# Patient Record
Sex: Female | Born: 1955 | Race: White | Hispanic: No | Marital: Married | Smoking: Current every day smoker
Health system: Southern US, Community
[De-identification: ages and names within clinical notes are randomized; demographics above are authoritative.]

## PROBLEM LIST (undated history)

## (undated) DIAGNOSIS — F17200 Nicotine dependence, unspecified, uncomplicated: Secondary | ICD-10-CM

## (undated) DIAGNOSIS — J449 Chronic obstructive pulmonary disease, unspecified: Secondary | ICD-10-CM

## (undated) DIAGNOSIS — N814 Uterovaginal prolapse, unspecified: Secondary | ICD-10-CM

## (undated) DIAGNOSIS — M069 Rheumatoid arthritis, unspecified: Secondary | ICD-10-CM

## (undated) DIAGNOSIS — C801 Malignant (primary) neoplasm, unspecified: Secondary | ICD-10-CM

## (undated) DIAGNOSIS — I10 Essential (primary) hypertension: Secondary | ICD-10-CM

## (undated) DIAGNOSIS — K219 Gastro-esophageal reflux disease without esophagitis: Secondary | ICD-10-CM

## (undated) DIAGNOSIS — J45909 Unspecified asthma, uncomplicated: Secondary | ICD-10-CM

## (undated) HISTORY — DX: Uterovaginal prolapse, unspecified: N81.4

## (undated) HISTORY — PX: SQUAMOUS CELL CARCINOMA EXCISION: SHX2433

## (undated) HISTORY — DX: Unspecified asthma, uncomplicated: J45.909

## (undated) HISTORY — PX: MOHS SURGERY: SUR867

## (undated) HISTORY — DX: Rheumatoid arthritis, unspecified: M06.9

---

## 1998-03-05 ENCOUNTER — Other Ambulatory Visit: Admission: RE | Admit: 1998-03-05 | Discharge: 1998-03-05 | Payer: Self-pay | Admitting: *Deleted

## 1999-04-03 ENCOUNTER — Other Ambulatory Visit: Admission: RE | Admit: 1999-04-03 | Discharge: 1999-04-03 | Payer: Self-pay | Admitting: *Deleted

## 2000-09-04 ENCOUNTER — Other Ambulatory Visit: Admission: RE | Admit: 2000-09-04 | Discharge: 2000-09-04 | Payer: Self-pay | Admitting: Obstetrics and Gynecology

## 2001-08-15 ENCOUNTER — Emergency Department (HOSPITAL_COMMUNITY): Admission: EM | Admit: 2001-08-15 | Discharge: 2001-08-15 | Payer: Self-pay | Admitting: Internal Medicine

## 2001-10-27 ENCOUNTER — Other Ambulatory Visit: Admission: RE | Admit: 2001-10-27 | Discharge: 2001-10-27 | Payer: Self-pay | Admitting: Obstetrics and Gynecology

## 2001-11-22 ENCOUNTER — Encounter: Payer: Self-pay | Admitting: Family Medicine

## 2001-11-22 ENCOUNTER — Ambulatory Visit (HOSPITAL_COMMUNITY): Admission: RE | Admit: 2001-11-22 | Discharge: 2001-11-22 | Payer: Self-pay | Admitting: Family Medicine

## 2002-06-28 ENCOUNTER — Encounter: Payer: Self-pay | Admitting: Family Medicine

## 2002-06-28 ENCOUNTER — Ambulatory Visit (HOSPITAL_COMMUNITY): Admission: RE | Admit: 2002-06-28 | Discharge: 2002-06-28 | Payer: Self-pay | Admitting: Family Medicine

## 2002-10-31 ENCOUNTER — Other Ambulatory Visit: Admission: RE | Admit: 2002-10-31 | Discharge: 2002-10-31 | Payer: Self-pay | Admitting: Obstetrics and Gynecology

## 2003-11-02 ENCOUNTER — Other Ambulatory Visit: Admission: RE | Admit: 2003-11-02 | Discharge: 2003-11-02 | Payer: Self-pay | Admitting: Obstetrics and Gynecology

## 2009-02-07 ENCOUNTER — Ambulatory Visit (HOSPITAL_COMMUNITY): Admission: RE | Admit: 2009-02-07 | Discharge: 2009-02-07 | Payer: Self-pay | Admitting: Interventional Radiology

## 2013-03-23 ENCOUNTER — Other Ambulatory Visit: Payer: Self-pay | Admitting: Family Medicine

## 2013-06-18 ENCOUNTER — Encounter: Payer: Self-pay | Admitting: *Deleted

## 2014-01-03 ENCOUNTER — Ambulatory Visit (INDEPENDENT_AMBULATORY_CARE_PROVIDER_SITE_OTHER): Payer: BC Managed Care – PPO | Admitting: Family Medicine

## 2014-01-03 ENCOUNTER — Encounter: Payer: Self-pay | Admitting: Family Medicine

## 2014-01-03 VITALS — BP 128/82 | Temp 98.3°F | Ht 68.0 in | Wt 147.0 lb

## 2014-01-03 DIAGNOSIS — H8110 Benign paroxysmal vertigo, unspecified ear: Secondary | ICD-10-CM

## 2014-01-03 MED ORDER — MECLIZINE HCL 25 MG PO TABS: 25.0000 mg | ORAL_TABLET | Freq: Three times a day (TID) | ORAL | Status: AC | PRN

## 2014-01-03 NOTE — Progress Notes (Signed)
   Subjective:    Patient ID: Dawn Meyer, female    DOB: August 29, 1955, 58 y.o.   MRN: [id number]  HPI Comments: Comes and goes. If she turns her head quickly, she feels dizzy. When she gets out of the bed, she is dizzy. Pt has felt dizzy in the shower when washing her hair.   Dizziness This is a new problem. Episode onset: Friday. The problem occurs daily. Associated symptoms include vertigo. Nothing aggravates the symptoms. She has tried nothing for the symptoms.   Spinning sens with nausea and dizziness  The first day hit harde, now somewhat better  No nausea no vomiting  No headach  Some possible tightness in face  No sore throat/ a friend gave pt  MECLizine tab otc//   Review of Systems  Neurological: Positive for dizziness and vertigo.   No chest pain no headache no back pain ROS otherwise negative    Objective:   Physical Exam  Alert no acute distress. HEENT normal. Lungs clear. Heart regular rate rhythm. Neuro exam intact      Assessment & Plan:  Impression 1 vertigo likely secondary to in her ear dysfunction discussed plan Antivert when necessary. Symptomatic care discussed. Warning signs discussed. WSL

## 2014-03-23 ENCOUNTER — Other Ambulatory Visit: Payer: Self-pay | Admitting: Family Medicine

## 2014-07-11 ENCOUNTER — Telehealth: Payer: Self-pay | Admitting: Family Medicine

## 2014-07-11 MED ORDER — FLUCONAZOLE 150 MG PO TABS: 150.0000 mg | ORAL_TABLET | Freq: Once | ORAL | Status: DC

## 2014-07-11 NOTE — Telephone Encounter (Signed)
Pt is requesting diflucan be called in for a yeast infection. [place].

## 2014-07-11 NOTE — Telephone Encounter (Signed)
Sent in Diflucan per protocol. Pt will schedule OV if she feels like she is getting worst.

## 2014-07-17 ENCOUNTER — Ambulatory Visit (INDEPENDENT_AMBULATORY_CARE_PROVIDER_SITE_OTHER): Payer: BC Managed Care – PPO | Admitting: Family Medicine

## 2014-07-17 ENCOUNTER — Encounter: Payer: Self-pay | Admitting: Family Medicine

## 2014-07-17 VITALS — BP 140/90 | Temp 98.3°F | Ht 67.0 in | Wt 146.0 lb

## 2014-07-17 DIAGNOSIS — J310 Chronic rhinitis: Secondary | ICD-10-CM

## 2014-07-17 DIAGNOSIS — J329 Chronic sinusitis, unspecified: Secondary | ICD-10-CM

## 2014-07-17 MED ORDER — FLUCONAZOLE 150 MG PO TABS: ORAL_TABLET | ORAL | Status: DC

## 2014-07-17 MED ORDER — AMOXICILLIN-POT CLAVULANATE 875-125 MG PO TABS: 1.0000 | ORAL_TABLET | Freq: Two times a day (BID) | ORAL | Status: AC

## 2014-07-17 NOTE — Progress Notes (Signed)
   Subjective:    Patient ID: Dawn Meyer, female    DOB: 1955/09/15, 59 y.o.   MRN: [id number]  Sinusitis This is a new problem. Episode onset: Thursday. There has been no fever. Associated symptoms include congestion, coughing and sinus pressure. (Wheezing at night) Past treatments include oral decongestants (inhaler). The treatment provided mild relief.   Vertigo like syptoms  Then cough and coug  Frontal tightness and fullness  Using the inhaler thru the weekend  Wheezy cough prod with less whezing   History of asthma Review of Systems  HENT: Positive for congestion and sinus pressure.   Respiratory: Positive for cough.    no vomiting no diarrhea     Objective:   Physical Exam  Alert mild malaise vital stable HET moderate nasal congestion frontal turns pharynx erythematous neck supple. Lungs bronchial cough but no wheezes or crackles no tachypnea heart regular in rhythm.      Assessment & Plan:  Impression rhinosinusitis/flare of asthma plan Ventolin 2 sprays 4 times a day. Augmentin twice a day 10 days. Diflucan if needed WSL

## 2015-04-30 ENCOUNTER — Ambulatory Visit (INDEPENDENT_AMBULATORY_CARE_PROVIDER_SITE_OTHER): Payer: BC Managed Care – PPO | Admitting: Family Medicine

## 2015-04-30 ENCOUNTER — Encounter: Payer: Self-pay | Admitting: Family Medicine

## 2015-04-30 VITALS — BP 130/82 | Temp 98.4°F | Ht 67.0 in | Wt 146.1 lb

## 2015-04-30 DIAGNOSIS — J329 Chronic sinusitis, unspecified: Secondary | ICD-10-CM | POA: Diagnosis not present

## 2015-04-30 MED ORDER — AMOXICILLIN-POT CLAVULANATE 875-125 MG PO TABS: 1.0000 | ORAL_TABLET | Freq: Two times a day (BID) | ORAL | Status: AC

## 2015-04-30 NOTE — Progress Notes (Signed)
   Subjective:    Patient ID: Dawn Meyer, female    DOB: Aug 26, 1955, 59 y.o.   MRN: [id number]  Cough This is a new problem. The current episode started in the past 7 days. Associated symptoms include myalgias, nasal congestion and rhinorrhea. Associated symptoms comments: Sinus pressure, Head congestion. Treatments tried: Alka seltzer,    Patient states no other concerns this visit. Slight weezing   Frontal cong and headache Pressure in the ceeks  Uses alka seltze   no sob   Review of Systems  HENT: Positive for rhinorrhea.   Respiratory: Positive for cough.   Musculoskeletal: Positive for myalgias.  no hi fever     Objective:   Physical Exam Alert mild malaise. Bronchial cough during exam vital stable frontal maxillary tenderness evident pharynx erythematous neck supple lungs no wheezes no crackles no tachypnea heart regular in rhythm.       Assessment & Plan:  Impression rhinosinusitis with known asthma and tendency to move into asthma flare nonobvious at this point plan appropriate antibiotic symptom care discussed if significant wheezes [place] utilize albuterol and call for prednisone WSL

## 2015-09-21 ENCOUNTER — Other Ambulatory Visit: Payer: Self-pay | Admitting: Family Medicine

## 2016-04-22 ENCOUNTER — Ambulatory Visit: Payer: BC Managed Care – PPO | Admitting: Rheumatology

## 2016-05-05 ENCOUNTER — Encounter: Payer: Self-pay | Admitting: Family Medicine

## 2016-05-05 ENCOUNTER — Ambulatory Visit (INDEPENDENT_AMBULATORY_CARE_PROVIDER_SITE_OTHER): Payer: BC Managed Care – PPO | Admitting: Family Medicine

## 2016-05-05 VITALS — BP 122/82 | Temp 98.1°F | Ht 67.0 in | Wt 144.6 lb

## 2016-05-05 DIAGNOSIS — J329 Chronic sinusitis, unspecified: Secondary | ICD-10-CM | POA: Diagnosis not present

## 2016-05-05 MED ORDER — DOXYCYCLINE HYCLATE 100 MG PO TABS: 100.0000 mg | ORAL_TABLET | Freq: Two times a day (BID) | ORAL | 0 refills | Status: DC

## 2016-05-05 MED ORDER — ALBUTEROL SULFATE HFA 108 (90 BASE) MCG/ACT IN AERS: 2.0000 | INHALATION_SPRAY | Freq: Four times a day (QID) | RESPIRATORY_TRACT | 0 refills | Status: DC | PRN

## 2016-05-05 MED ORDER — FLUCONAZOLE 150 MG PO TABS: ORAL_TABLET | ORAL | 0 refills | Status: DC

## 2016-05-05 MED ORDER — HYDROCODONE-HOMATROPINE 5-1.5 MG/5ML PO SYRP: 5.0000 mL | ORAL_SOLUTION | Freq: Every evening | ORAL | 0 refills | Status: DC | PRN

## 2016-05-05 NOTE — Progress Notes (Signed)
   Subjective:    Patient ID: Dawn Meyer, female    DOB: 1955/08/03, 60 y.o.   MRN: [id number]  Sinus Problem  This is a new problem. The current episode started in the past 7 days. Associated symptoms include congestion and coughing. Past treatments include oral decongestants (hycodan).    Not into the chest yet  Pos frontal headache, pos nasal disch, sore itn the nose, going on for awhile  Pos cough   Pos gunky dischrege  Used hydromet for the cough and has helped  Uses hydromet    Review of Systems  HENT: Positive for congestion.   Respiratory: Positive for cough.        Objective:   Physical Exam  Alert, mild malaise. Hydration good Vitals stable. frontal/ maxillary tenderness evident positive nasal congestion. pharynx normal neck supple  lungs clear/no crackles or wheezes. heart regular in rhythm       Assessment & Plan:  Impression rhinosinusitis likely post viral, discussed with patient. plan antibiotics prescribed. Questions answered. Symptomatic care discussed. warning signs discussed. WSL

## 2016-05-06 ENCOUNTER — Telehealth: Payer: Self-pay | Admitting: Rheumatology

## 2016-05-06 NOTE — Telephone Encounter (Signed)
Patient states she is on doxycycline for a sinus infection and would like to know if she should hold off on the Humira this week. Please call patient.

## 2016-05-06 NOTE — Telephone Encounter (Signed)
Patient has been advised to hold Humira until she finishes her antibiotic and she is well. Patient verbalized understanding.

## 2016-05-12 ENCOUNTER — Telehealth: Payer: Self-pay | Admitting: Family Medicine

## 2016-05-12 MED ORDER — AMOXICILLIN-POT CLAVULANATE 875-125 MG PO TABS: 1.0000 | ORAL_TABLET | Freq: Two times a day (BID) | ORAL | 0 refills | Status: AC

## 2016-05-12 MED ORDER — BENZONATATE 100 MG PO CAPS: 100.0000 mg | ORAL_CAPSULE | Freq: Four times a day (QID) | ORAL | 0 refills | Status: DC | PRN

## 2016-05-12 NOTE — Telephone Encounter (Signed)
Patient seen on 05/05/16 for rhinosinusitis.  She was prescribed doxycycline and hycodan syrup.  She broke out in fine bumps on arms, legs and back.  She stopped taking her antibiotic on Saturday morning.    She says yesterday afternoon, she developed more of a rash on her back and she believes its coming from the cough syrup because she read the side effects.  She wants to know what Dr. Richardson Landry recommends?  [place]

## 2016-05-12 NOTE — Telephone Encounter (Signed)
Chance of being allergic to both unlikely  Stay off doxy, switch to aug 875 bid ten d  Hold off on rx cough med for now but not necressarily forever   Can call in tess perles if pt desires 100 mg 28 one q six hr s pern

## 2016-05-12 NOTE — Telephone Encounter (Signed)
Prescriptions sent electronically to pharmacy. Patient notified and will stop doxycycline and hold hycodan for now.

## 2016-05-14 ENCOUNTER — Telehealth: Payer: Self-pay | Admitting: Family Medicine

## 2016-05-14 MED ORDER — PREDNISONE 20 MG PO TABS: ORAL_TABLET | ORAL | 0 refills | Status: DC

## 2016-05-14 NOTE — Telephone Encounter (Signed)
Pt is having a hard time dealing with the rash that came from the antibiotic. Pt states that she is unable to take benadryl during the day due to it making her drowsy. Pt is wanting to know if there is anything else she can do for it. Please advise.

## 2016-05-14 NOTE — Telephone Encounter (Addendum)
Prescription sent electronically to pharmacy. Patient notified. 

## 2016-06-12 ENCOUNTER — Ambulatory Visit: Payer: BC Managed Care – PPO | Admitting: Rheumatology

## 2016-06-16 ENCOUNTER — Other Ambulatory Visit: Payer: Self-pay | Admitting: Radiology

## 2016-06-16 ENCOUNTER — Telehealth: Payer: Self-pay | Admitting: Radiology

## 2016-06-16 ENCOUNTER — Other Ambulatory Visit: Payer: Self-pay | Admitting: Rheumatology

## 2016-06-16 DIAGNOSIS — Z79899 Other long term (current) drug therapy: Secondary | ICD-10-CM

## 2016-06-16 NOTE — Telephone Encounter (Signed)
Call patient / she is very past due for labs for Humira Last done in July

## 2016-06-16 NOTE — Telephone Encounter (Signed)
I have called patient to advise labs are very past due.

## 2016-06-24 ENCOUNTER — Telehealth: Payer: Self-pay | Admitting: Radiology

## 2016-06-24 LAB — CMP14+EGFR
ALT: 11 IU/L (ref 0–32)
AST: 16 IU/L (ref 0–40)
Albumin/Globulin Ratio: 1.6 (ref 1.2–2.2)
Albumin: 4.1 g/dL (ref 3.5–5.5)
Alkaline Phosphatase: 106 IU/L (ref 39–117)
BILIRUBIN TOTAL: 0.3 mg/dL (ref 0.0–1.2)
BUN / CREAT RATIO: 15 (ref 9–23)
BUN: 13 mg/dL (ref 6–24)
CALCIUM: 9 mg/dL (ref 8.7–10.2)
CO2: 24 mmol/L (ref 18–29)
CREATININE: 0.86 mg/dL (ref 0.57–1.00)
Chloride: 102 mmol/L (ref 96–106)
GFR, EST AFRICAN AMERICAN: 86 mL/min/{1.73_m2} (ref >59)
GFR, EST NON AFRICAN AMERICAN: 75 mL/min/{1.73_m2} (ref >59)
GLUCOSE: 89 mg/dL (ref 65–99)
Globulin, Total: 2.6 g/dL (ref 1.5–4.5)
Potassium: 3.6 mmol/L (ref 3.5–5.2)
Sodium: 142 mmol/L (ref 134–144)
TOTAL PROTEIN: 6.7 g/dL (ref 6.0–8.5)

## 2016-06-24 LAB — CBC WITH DIFFERENTIAL/PLATELET
BASOS ABS: 0 10*3/uL (ref 0.0–0.2)
Basos: 0 %
EOS (ABSOLUTE): 0.3 10*3/uL (ref 0.0–0.4)
Eos: 4 %
HEMOGLOBIN: 12.9 g/dL (ref 11.1–15.9)
Hematocrit: 39.8 % (ref 34.0–46.6)
IMMATURE GRANS (ABS): 0 10*3/uL (ref 0.0–0.1)
IMMATURE GRANULOCYTES: 0 %
LYMPHS: 25 %
Lymphocytes Absolute: 2.2 10*3/uL (ref 0.7–3.1)
MCH: 29.7 pg (ref 26.6–33.0)
MCHC: 32.4 g/dL (ref 31.5–35.7)
MCV: 92 fL (ref 79–97)
MONOCYTES: 8 %
Monocytes Absolute: 0.7 10*3/uL (ref 0.1–0.9)
NEUTROS PCT: 63 %
Neutrophils Absolute: 5.5 10*3/uL (ref 1.4–7.0)
PLATELETS: 183 10*3/uL (ref 150–379)
RBC: 4.34 x10E6/uL (ref 3.77–5.28)
RDW: 14.2 % (ref 12.3–15.4)
WBC: 8.8 10*3/uL (ref 3.4–10.8)

## 2016-06-24 NOTE — Telephone Encounter (Signed)
-----   Message from Bo Merino, MD sent at 06/24/2016  1:16 PM EST ----- Labs normal

## 2016-06-24 NOTE — Progress Notes (Signed)
Labs normal.

## 2016-06-24 NOTE — Telephone Encounter (Signed)
Called patient to advise labs are normal

## 2016-06-30 ENCOUNTER — Ambulatory Visit: Payer: BC Managed Care – PPO | Admitting: Rheumatology

## 2016-07-23 ENCOUNTER — Other Ambulatory Visit: Payer: Self-pay | Admitting: Rheumatology

## 2016-07-24 NOTE — Telephone Encounter (Signed)
ok 

## 2016-07-24 NOTE — Telephone Encounter (Signed)
Last Visit: 11/20/15 Next Visit: 08/18/16 Labs: 06/23/16 WNL TB Gold: 03/2015 Neg Patient going beginning of next week to update TB Gold.   Okay to refill 30 supply Humira?

## 2016-07-29 ENCOUNTER — Other Ambulatory Visit: Payer: Self-pay | Admitting: *Deleted

## 2016-07-29 ENCOUNTER — Telehealth: Payer: Self-pay | Admitting: Rheumatology

## 2016-07-29 DIAGNOSIS — Z9225 Personal history of immunosupression therapy: Secondary | ICD-10-CM

## 2016-07-29 DIAGNOSIS — Z79899 Other long term (current) drug therapy: Secondary | ICD-10-CM

## 2016-07-29 NOTE — Telephone Encounter (Signed)
Patient is at [facility] right now and they are telling her that they do not have her order.  Cb#[id number].  Thank you.

## 2016-07-29 NOTE — Telephone Encounter (Signed)
[facility] needs orders released for patient. She is in the office right now for labs.   Fax# [phone]

## 2016-07-29 NOTE — Telephone Encounter (Signed)
done

## 2016-07-29 NOTE — Telephone Encounter (Signed)
Lab orders released and faxed.  

## 2016-08-01 LAB — QUANTIFERON IN TUBE
QFT TB AG MINUS NIL VALUE: 0.03 [IU]/mL
QUANTIFERON MITOGEN VALUE: 6.87 [IU]/mL
QUANTIFERON NIL VALUE: 0.08 [IU]/mL
QUANTIFERON TB AG VALUE: 0.11 IU/mL
QUANTIFERON TB GOLD: NEGATIVE

## 2016-08-01 LAB — QUANTIFERON TB GOLD ASSAY (BLOOD)

## 2016-08-01 NOTE — Progress Notes (Signed)
neg

## 2016-08-18 ENCOUNTER — Encounter: Payer: Self-pay | Admitting: Rheumatology

## 2016-08-18 ENCOUNTER — Ambulatory Visit (INDEPENDENT_AMBULATORY_CARE_PROVIDER_SITE_OTHER): Payer: BC Managed Care – PPO | Admitting: Rheumatology

## 2016-08-18 VITALS — BP 157/91 | HR 62 | Resp 13 | Ht 67.0 in | Wt 144.0 lb

## 2016-08-18 DIAGNOSIS — M159 Polyosteoarthritis, unspecified: Secondary | ICD-10-CM | POA: Insufficient documentation

## 2016-08-18 DIAGNOSIS — M810 Age-related osteoporosis without current pathological fracture: Secondary | ICD-10-CM | POA: Insufficient documentation

## 2016-08-18 DIAGNOSIS — M158 Other polyosteoarthritis: Secondary | ICD-10-CM

## 2016-08-18 DIAGNOSIS — M0579 Rheumatoid arthritis with rheumatoid factor of multiple sites without organ or systems involvement: Secondary | ICD-10-CM | POA: Diagnosis not present

## 2016-08-18 DIAGNOSIS — M19071 Primary osteoarthritis, right ankle and foot: Secondary | ICD-10-CM

## 2016-08-18 DIAGNOSIS — M19072 Primary osteoarthritis, left ankle and foot: Secondary | ICD-10-CM

## 2016-08-18 DIAGNOSIS — M25432 Effusion, left wrist: Secondary | ICD-10-CM

## 2016-08-18 DIAGNOSIS — Z79899 Other long term (current) drug therapy: Secondary | ICD-10-CM

## 2016-08-18 DIAGNOSIS — M19041 Primary osteoarthritis, right hand: Secondary | ICD-10-CM | POA: Diagnosis not present

## 2016-08-18 DIAGNOSIS — M19042 Primary osteoarthritis, left hand: Secondary | ICD-10-CM

## 2016-08-18 DIAGNOSIS — M818 Other osteoporosis without current pathological fracture: Secondary | ICD-10-CM

## 2016-08-18 MED ORDER — ADALIMUMAB 40 MG/0.8ML ~~LOC~~ PSKT: 40.0000 mg | PREFILLED_SYRINGE | SUBCUTANEOUS | 0 refills | Status: DC

## 2016-08-18 MED ORDER — ADALIMUMAB 40 MG/0.8ML ~~LOC~~ AJKT: 40.0000 mg | AUTO-INJECTOR | SUBCUTANEOUS | 0 refills | Status: DC

## 2016-08-18 NOTE — Progress Notes (Signed)
Office Visit Note  Patient: Dawn Meyer             Date of Birth: Jun 17, 1955           MRN: [id number]             PCP: Mickie Hillier, MD Referring: Mikey Kirschner, MD Visit Date: 08/18/2016 Occupation: _0 @    Subjective:  Follow-up   History of Present Illness: Dawn Meyer is a 61 y.o. female  Last seen 11/20/2015. Patient's rheumatoid arthritis is doing really well. History of rheumatoid factor positive CCP positive. Patient is taking her Humira every 2 weeks as scheduled.  Patient lost her mother 06/24/2016. She struggling with the loss as well as having to take care of her elderly father who is "independent but needs his daughter to help him on a regular basis.  She is doing cross fit and feels really good about that.  Patient is a smoker. She quit 3 years ago and then restarted. She's currently smoking about 10-15 cigarettes daily. We did discuss smoking cessation at length and patient is agreeable.   Activities of Daily Living:  Patient reports morning stiffness for 15 minutes.   Patient Denies nocturnal pain.  Difficulty dressing/grooming: Denies Difficulty climbing stairs: Denies Difficulty getting out of chair: Denies Difficulty using hands for taps, buttons, cutlery, and/or writing: Denies   No Rheumatology ROS completed.   PMFS History:  Patient Active Problem List   Diagnosis Date Noted  . Rheumatoid arthritis involving multiple sites with positive rheumatoid factor ([place]) 08/18/2016  . Swollen wrist, left 08/18/2016  . 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 Primary osteoarthritis of both feet 08/18/2016  . OP (osteoporosis) 08/18/2016  . Degenerative joint disease involving multiple joints 08/18/2016  . High risk medication use 08/18/2016    Past Medical History:  Diagnosis Date  . Asthma   . RA (rheumatoid arthritis) ([place])     History reviewed. No pertinent family history. History reviewed. No pertinent  surgical history. Social History   Social History Narrative  . No narrative on file     Objective: Vital Signs: BP (!) 157/91   Pulse 62   Resp 13   Ht _1  (1.702 m)   Wt 144 lb (65.3 kg)   BMI 22.55 kg/m    Physical Exam   Musculoskeletal Exam:  Full range of motion of all joints Grip strength is equal and strong bilaterally Fiber myalgia tender points are all absent  CDAI Exam: No CDAI exam completed.  No synovitis on examination. Note the patient has right second MCP with synovial thickening from old damage. No current tenderness warmth. Grip strength equal and strong bilaterally Fiber myalgia tender points are absent. Patient has about 0 pain from rheumatoid arthritis at this time. She does have ongoing OA of the hands and feet which causes her some discomfort at times  Investigation: Findings:  March 2017 CBC and Comprehensive metabolic panel were normal  TB Gold Negative  Orders Only on 07/29/2016  Component Date Value Ref Range Status  . QUANTIFERON INCUBATION 07/29/2016 Comment   Final  . QUANTIFERON TB GOLD 07/29/2016 Negative  Negative Final  . QUANTIFERON CRITERIA 07/29/2016 Comment   Final   Comment: To be considered positive a specimen should have a TB Ag minus Nil value greater than or equal to 0.35 IU/mL and in addition the TB Ag minus Nil value must be greater than or equal to 25% of the Nil value. There may  be insufficient information in these values to differentiate between some negative and some indeterminate test values.   . QUANTIFERON TB AG VALUE 07/29/2016 0.11  IU/mL Final  . Quantiferon Nil Value 07/29/2016 0.08  IU/mL Final  . QUANTIFERON MITOGEN VALUE 07/29/2016 6.87  IU/mL Final  . QFT TB AG MINUS NIL VALUE 07/29/2016 0.03  IU/mL Final  . Interpretation: 07/29/2016 Comment   Final   Comment: The QuantiFERON TB Gold (in Tube) assay is intended for use as an aid in the diagnosis of TB infection. Negative results suggest that  there is no TB infection. In patients with high suspicion of exposure, a negative test should be repeated. A positive test indicates infection with Mycobacterium tuberculosis. Among individuals without tuberculosis infection, a positive test may be due to exposure to Vil[place], M. szulgai or M. marinum. On the Internet, go to https://figueroa-lambert.info/ for further details.   Orders Only on 06/16/2016  Component Date Value Ref Range Status  . WBC 06/23/2016 8.8  3.4 - 10.8 x10E3/uL Final  . RBC 06/23/2016 4.34  3.77 - 5.28 x10E6/uL Final  . Hemoglobin 06/23/2016 12.9  11.1 - 15.9 g/dL Final  . Hematocrit 06/23/2016 39.8  34.0 - 46.6 % Final  . MCV 06/23/2016 92  79 - 97 fL Final  . MCH 06/23/2016 29.7  26.6 - 33.0 pg Final  . MCHC 06/23/2016 32.4  31.5 - 35.7 g/dL Final  . RDW 06/23/2016 14.2  12.3 - 15.4 % Final  . Platelets 06/23/2016 183  150 - 379 x10E3/uL Final  . Neutrophils 06/23/2016 63  Not Estab. % Final  . Lymphs 06/23/2016 25  Not Estab. % Final  . Monocytes 06/23/2016 8  Not Estab. % Final  . Eos 06/23/2016 4  Not Estab. % Final  . Basos 06/23/2016 0  Not Estab. % Final  . Neutrophils Absolute 06/23/2016 5.5  1.4 - 7.0 x10E3/uL Final  . Lymphocytes Absolute 06/23/2016 2.2  0.7 - 3.1 x10E3/uL Final  . Monocytes Absolute 06/23/2016 0.7  0.1 - 0.9 x10E3/uL Final  . EOS (ABSOLUTE) 06/23/2016 0.3  0.0 - 0.4 x10E3/uL Final  . Basophils Absolute 06/23/2016 0.0  0.0 - 0.2 x10E3/uL Final  . Immature Granulocytes 06/23/2016 0  Not Estab. % Final  . Immature Grans (Abs) 06/23/2016 0.0  0.0 - 0.1 x10E3/uL Final  . Glucose 06/23/2016 89  65 - 99 mg/dL Final  . BUN 06/23/2016 13  6 - 24 mg/dL Final  . Creatinine, Ser 06/23/2016 0.86  0.57 - 1.00 mg/dL Final  . GFR calc non Af Amer 06/23/2016 75  >59 mL/min/1.73 Final  . GFR calc Af Amer 06/23/2016 86  >59 mL/min/1.73 Final  . BUN/Creatinine Ratio 06/23/2016 15  9 - 23 Final  . Sodium 06/23/2016 142  134 - 144 mmol/L Final  . Potassium  06/23/2016 3.6  3.5 - 5.2 mmol/L Final  . Chloride 06/23/2016 102  96 - 106 mmol/L Final  . CO2 06/23/2016 24  18 - 29 mmol/L Final  . Calcium 06/23/2016 9.0  8.7 - 10.2 mg/dL Final  . Total Protein 06/23/2016 6.7  6.0 - 8.5 g/dL Final  . Albumin 06/23/2016 4.1  3.5 - 5.5 g/dL Final  . Globulin, Total 06/23/2016 2.6  1.5 - 4.5 g/dL Final  . Albumin/Globulin Ratio 06/23/2016 1.6  1.2 - 2.2 Final  . Bilirubin Total 06/23/2016 0.3  0.0 - 1.2 mg/dL Final  . Alkaline Phosphatase 06/23/2016 106  39 - 117 IU/L Final  . AST 06/23/2016 16  0 - 40 IU/L Final  . ALT 06/23/2016 11  0 - 32 IU/L Final      Imaging: No results found.  Speciality Comments: No specialty comments available.    Procedures:  No procedures performed Allergies: Doxycycline   Assessment / Plan:     Visit Diagnoses: Rheumatoid arthritis involving multiple sites with positive rheumatoid factor (HCC) - +CCP  High risk medication use - Humira Pen-69m/0.8ML PNKTInject 411mq other week,REFRIGERATE - Plan: CBC with Differential/Platelet, CMP14+EGFR, CBC with Differential/Platelet, CMP14+EGFR  Swollen wrist, left  Primary osteoarthritis of both hands  Primary osteoarthritis of both feet  Other osteoporosis, unspecified pathological fracture presence  Other osteoarthritis involving multiple joints   Patient gets lab at [facility]. I have referred her prescription for CBC with differential and CMP with GFR to get every 3 months.  History of osteoporosis. Has a history of discomfort on Fosamax. Was given a handout on Reclast the patient wants to continue to wait on reclast until next visit  Patient is agreeable to cut down on smoking by next visit. She plans to either discontinue all the way or minimize it as low as possible  Orders: Orders Placed This Encounter  Procedures  . CBC with Differential/Platelet  . CMP14+EGFR   Meds ordered this encounter  Medications  . Adalimumab (HUMIRA) 40 MG/0.8ML PSKT     Sig: Inject 0.8 mLs (40 mg total) into the skin every 14 (fourteen) days. Lot [id number]:09/2017    Dispense:  1 each    Refill:  0    Face-to-face time spent with patient was 30 minutes. 50% of time was spent in counseling and coordination of care.  Follow-Up Instructions: Return in about 5 months (around 01/18/2017) for RA, humira q 2 wks,  mom died ja02/11/2018pt caring for 8554r old dad.. Eliezer LoftsPA-C  Note - This record has been created using DrEditor, commissioning Chart creation errors have been sought, but may not always  have been located. Such creation errors do not reflect on  the standard of medical care.

## 2016-08-18 NOTE — Addendum Note (Signed)
Addended by: Carole Binning on: 08/18/2016 04:40 PM   Modules accepted: Orders

## 2016-09-24 ENCOUNTER — Telehealth: Payer: Self-pay | Admitting: Rheumatology

## 2016-09-24 LAB — CBC WITH DIFFERENTIAL/PLATELET
BASOS: 0 %
Basophils Absolute: 0 10*3/uL (ref 0.0–0.2)
EOS (ABSOLUTE): 0.6 10*3/uL — ABNORMAL HIGH (ref 0.0–0.4)
EOS: 7 %
HEMATOCRIT: 36.9 % (ref 34.0–46.6)
HEMOGLOBIN: 12.4 g/dL (ref 11.1–15.9)
IMMATURE GRANULOCYTES: 0 %
Immature Grans (Abs): 0 10*3/uL (ref 0.0–0.1)
Lymphocytes Absolute: 2.9 10*3/uL (ref 0.7–3.1)
Lymphs: 37 %
MCH: 30 pg (ref 26.6–33.0)
MCHC: 33.6 g/dL (ref 31.5–35.7)
MCV: 89 fL (ref 79–97)
MONOS ABS: 0.5 10*3/uL (ref 0.1–0.9)
Monocytes: 6 %
Neutrophils Absolute: 3.8 10*3/uL (ref 1.4–7.0)
Neutrophils: 50 %
Platelets: 183 10*3/uL (ref 150–379)
RBC: 4.13 x10E6/uL (ref 3.77–5.28)
RDW: 14.2 % (ref 12.3–15.4)
WBC: 7.8 10*3/uL (ref 3.4–10.8)

## 2016-09-24 LAB — CMP14+EGFR
A/G RATIO: 1.6 (ref 1.2–2.2)
ALBUMIN: 4.1 g/dL (ref 3.5–5.5)
ALT: 14 IU/L (ref 0–32)
AST: 14 IU/L (ref 0–40)
Alkaline Phosphatase: 105 IU/L (ref 39–117)
BUN / CREAT RATIO: 12 (ref 9–23)
BUN: 11 mg/dL (ref 6–24)
Bilirubin Total: 0.4 mg/dL (ref 0.0–1.2)
CALCIUM: 9.2 mg/dL (ref 8.7–10.2)
CO2: 24 mmol/L (ref 18–29)
Chloride: 105 mmol/L (ref 96–106)
Creatinine, Ser: 0.91 mg/dL (ref 0.57–1.00)
GFR, EST AFRICAN AMERICAN: 80 mL/min/{1.73_m2} (ref >59)
GFR, EST NON AFRICAN AMERICAN: 70 mL/min/{1.73_m2} (ref >59)
GLOBULIN, TOTAL: 2.5 g/dL (ref 1.5–4.5)
Glucose: 66 mg/dL (ref 65–99)
POTASSIUM: 4.6 mmol/L (ref 3.5–5.2)
SODIUM: 142 mmol/L (ref 134–144)
TOTAL PROTEIN: 6.6 g/dL (ref 6.0–8.5)

## 2016-09-24 NOTE — Telephone Encounter (Signed)
Patient was returning a call she missed. It looks like Seth Bake tried contacting her about lab results. Please advise.

## 2016-09-24 NOTE — Telephone Encounter (Signed)
Patient advised of lab results and verbalized understanding.  

## 2016-11-17 ENCOUNTER — Other Ambulatory Visit: Payer: Self-pay | Admitting: Rheumatology

## 2016-11-17 NOTE — Telephone Encounter (Signed)
Last visit: 08/18/16 Next Visit: 01/19/17 Labs: 09/23/16 WNL TB Gold: 07/29/16 Neg  Okay to refill Humira?

## 2016-11-17 NOTE — Telephone Encounter (Signed)
ok 

## 2017-01-13 ENCOUNTER — Other Ambulatory Visit: Payer: Self-pay | Admitting: Family Medicine

## 2017-01-19 ENCOUNTER — Ambulatory Visit: Payer: BC Managed Care – PPO | Admitting: Rheumatology

## 2017-03-19 ENCOUNTER — Ambulatory Visit (INDEPENDENT_AMBULATORY_CARE_PROVIDER_SITE_OTHER): Payer: BC Managed Care – PPO | Admitting: Family Medicine

## 2017-03-19 ENCOUNTER — Encounter: Payer: Self-pay | Admitting: Family Medicine

## 2017-03-19 ENCOUNTER — Telehealth: Payer: Self-pay | Admitting: *Deleted

## 2017-03-19 VITALS — BP 118/76 | Temp 98.5°F | Ht 67.0 in | Wt 146.0 lb

## 2017-03-19 DIAGNOSIS — J329 Chronic sinusitis, unspecified: Secondary | ICD-10-CM | POA: Diagnosis not present

## 2017-03-19 MED ORDER — METHYLPREDNISOLONE ACETATE 40 MG/ML IJ SUSP
40.0000 mg | Freq: Once | INTRAMUSCULAR | Status: AC
  Administered 2017-03-19: 40 mg via INTRAMUSCULAR

## 2017-03-19 MED ORDER — AMOXICILLIN-POT CLAVULANATE 875-125 MG PO TABS: 1.0000 | ORAL_TABLET | Freq: Two times a day (BID) | ORAL | 0 refills | Status: DC

## 2017-03-19 MED ORDER — FLUCONAZOLE 150 MG PO TABS: 150.0000 mg | ORAL_TABLET | Freq: Once | ORAL | 0 refills | Status: AC

## 2017-03-19 NOTE — Telephone Encounter (Signed)
Patient called stating she always gets something to take with her antibiotic so she doesn't get a yeast infection and she forgot to tell Dr Richardson Landry. Please advise   [place]

## 2017-03-19 NOTE — Telephone Encounter (Signed)
Pt aware we sent in # 2 Diflucan 150 mg she may take one and then another three days later.

## 2017-03-19 NOTE — Progress Notes (Signed)
   Subjective:    Patient ID: Dawn Meyer, female    DOB: 1955/10/02, 61 y.o.   MRN: [id number]  Sinusitis  This is a new problem. Episode onset: one month or more. (Wheezing, runny nose) Treatments tried: allergy med, nasal spray.    Nose cong and coughing  All stopped up   psos nasal discharge  Patient notes diminished energy.  Cough congestion and drainage 1 months duration.  Nasal decongestant as needed.  Symptom care discussed    Mod headache ff and on  occas spell of chest congestion  Couple weeks of wheezin gearly ;nasal spray for awhile     Review of Systems No headache, no major weight loss or weight gain, no chest pain no back pain abdominal pain no change in bowel habits complete ROS otherwise negative     Objective:   Physical Exam Alert, mild malaise. Hydration good Vitals stable. frontal/ maxillary tenderness evident positive nasal congestion. pharynx normal neck supple  lungs clear/no crackles or wheezes. heart regular in rhythm        Assessment & Plan:  Impression rhinosinusitis likely post viral, discussed with patient. plan antibiotics prescribed. Questions answered. Symptomatic care discussed. warning signs discussed. WSL Also elements of rhinitis met patient advised to stop decongestant spray.  Initiate antibiotics.  Begin steroid injection.

## 2017-03-23 ENCOUNTER — Other Ambulatory Visit: Payer: Self-pay | Admitting: Rheumatology

## 2017-03-24 NOTE — Telephone Encounter (Signed)
Last Visit: 08/18/16 Next Visit was due in August. Message sent to the front to schedule patient.  Labs: 09/23/16 WNL TB Gold: 07/29/16 Neg  Patient will update labs this week.    Okay to refill 30 day supply Humira?

## 2017-03-24 NOTE — Telephone Encounter (Signed)
ok 

## 2017-03-31 ENCOUNTER — Other Ambulatory Visit: Payer: Self-pay | Admitting: Rheumatology

## 2017-04-01 LAB — CMP14+EGFR
ALBUMIN: 4.1 g/dL (ref 3.5–5.5)
ALK PHOS: 103 IU/L (ref 39–117)
ALT: 12 IU/L (ref 0–32)
AST: 17 IU/L (ref 0–40)
Albumin/Globulin Ratio: 1.5 (ref 1.2–2.2)
BILIRUBIN TOTAL: 0.3 mg/dL (ref 0.0–1.2)
BUN / CREAT RATIO: 17 (ref 9–23)
BUN: 15 mg/dL (ref 6–24)
CHLORIDE: 106 mmol/L (ref 96–106)
CO2: 21 mmol/L (ref 20–29)
CREATININE: 0.88 mg/dL (ref 0.57–1.00)
Calcium: 9 mg/dL (ref 8.7–10.2)
GFR calc Af Amer: 83 mL/min/{1.73_m2} (ref >59)
GFR calc non Af Amer: 72 mL/min/{1.73_m2} (ref >59)
GLOBULIN, TOTAL: 2.8 g/dL (ref 1.5–4.5)
GLUCOSE: 77 mg/dL (ref 65–99)
Potassium: 4.2 mmol/L (ref 3.5–5.2)
SODIUM: 142 mmol/L (ref 134–144)
Total Protein: 6.9 g/dL (ref 6.0–8.5)

## 2017-04-01 LAB — CBC WITH DIFFERENTIAL/PLATELET
BASOS ABS: 0 10*3/uL (ref 0.0–0.2)
Basos: 0 %
EOS (ABSOLUTE): 0.4 10*3/uL (ref 0.0–0.4)
EOS: 4 %
Hematocrit: 39.3 % (ref 34.0–46.6)
Hemoglobin: 12.9 g/dL (ref 11.1–15.9)
IMMATURE GRANULOCYTES: 0 %
Immature Grans (Abs): 0 10*3/uL (ref 0.0–0.1)
LYMPHS ABS: 3.4 10*3/uL — AB (ref 0.7–3.1)
Lymphs: 40 %
MCH: 30.2 pg (ref 26.6–33.0)
MCHC: 32.8 g/dL (ref 31.5–35.7)
MCV: 92 fL (ref 79–97)
MONOS ABS: 0.5 10*3/uL (ref 0.1–0.9)
Monocytes: 6 %
NEUTROS PCT: 50 %
Neutrophils Absolute: 4.2 10*3/uL (ref 1.4–7.0)
PLATELETS: 195 10*3/uL (ref 150–379)
RBC: 4.27 x10E6/uL (ref 3.77–5.28)
RDW: 14.3 % (ref 12.3–15.4)
WBC: 8.4 10*3/uL (ref 3.4–10.8)

## 2017-04-01 NOTE — Progress Notes (Signed)
WNL

## 2017-04-09 ENCOUNTER — Telehealth: Payer: Self-pay | Admitting: Family Medicine

## 2017-04-09 MED ORDER — FLUCONAZOLE 150 MG PO TABS: ORAL_TABLET | ORAL | 0 refills | Status: DC

## 2017-04-09 NOTE — Telephone Encounter (Signed)
Spoke with patient and informed her per standing order medication for yeast infection was sent into pharmacy. Patient verbalized understanding.

## 2017-04-09 NOTE — Telephone Encounter (Signed)
Pt is needing something called in for a yeast inf.     [place]

## 2017-04-27 ENCOUNTER — Telehealth: Payer: Self-pay | Admitting: Rheumatology

## 2017-04-27 MED ORDER — ADALIMUMAB 40 MG/0.4ML ~~LOC~~ AJKT: 40.0000 mg | AUTO-INJECTOR | SUBCUTANEOUS | 0 refills | Status: DC

## 2017-04-27 NOTE — Telephone Encounter (Signed)
Patient called stating that [facility] informed her that we only called in a 28-day supply of her Humira.  She also stated that she did labwork and that we called her to let her know the labs were normal.  She would like to go ahead and do the 84-month supply.  CB#[phone] or [phone].  Thank you.

## 2017-04-27 NOTE — Telephone Encounter (Signed)
Last Visit: 08/18/16 Next Visit: 08/17/17 Labs: 03/31/17 WNL TB Gold: 07/29/16 Neg   Okay to refill per Dr. Estanislado Pandy

## 2017-04-29 ENCOUNTER — Telehealth: Payer: Self-pay | Admitting: Rheumatology

## 2017-04-29 MED ORDER — ADALIMUMAB 40 MG/0.4ML ~~LOC~~ AJKT: 40.0000 mg | AUTO-INJECTOR | SUBCUTANEOUS | 0 refills | Status: DC

## 2017-04-29 NOTE — Telephone Encounter (Signed)
Fax# [phone] please resend rx for 90 day supply/ not 28 day supply. 2 per 28 days is the way its packaged.

## 2017-04-29 NOTE — Telephone Encounter (Signed)
Prescription resent to the pharmacy.  

## 2017-05-06 ENCOUNTER — Other Ambulatory Visit: Payer: Self-pay | Admitting: Rheumatology

## 2017-07-23 ENCOUNTER — Telehealth: Payer: Self-pay | Admitting: Family Medicine

## 2017-07-23 ENCOUNTER — Other Ambulatory Visit: Payer: Self-pay | Admitting: Family Medicine

## 2017-07-23 MED ORDER — FLUCONAZOLE 150 MG PO TABS: ORAL_TABLET | ORAL | 0 refills | Status: DC

## 2017-07-23 NOTE — Telephone Encounter (Deleted)
Patient having  

## 2017-07-23 NOTE — Telephone Encounter (Signed)
Med sent into [place]; notified patient

## 2017-07-23 NOTE — Telephone Encounter (Signed)
I spoke with the pt and she states she gets yeast infections periodically and will need something called in for it. She state you have sent in the diflucan for her in the past. I asked if she was having a discharge and itching she states she just has itching no discharge. I asked if she is would like to be seen tomorrow by Hoyle Sauer she states she can not do that,she has other plans.She has an appt with Dr. Juel Burrow office next week and they have given her medications for her yeast in the past also.Please advise. Do we send in the Diflucan per protocol?

## 2017-07-23 NOTE — Telephone Encounter (Signed)
Pt is requesting something to be called in for a yeast infection.     [place]

## 2017-07-23 NOTE — Telephone Encounter (Signed)
Attempted to contact patient but phone kept ringing; unable to leave message. Calling to get more info on symptoms of yeast infection

## 2017-07-23 NOTE — Telephone Encounter (Signed)
Yes diflucan 150 numb two one po three d apart

## 2017-08-03 NOTE — Progress Notes (Deleted)
Office Visit Note  Patient: Dawn Meyer             Date of Birth: Jun 07, 1955           MRN: [id number]             PCP: Mikey Kirschner, MD Referring: Mikey Kirschner, MD Visit Date: 08/17/2017 Occupation: @GUAROCC @    Subjective:  No chief complaint on file.   History of Present Illness: Dawn Meyer is a 62 y.o. female ***   Activities of Daily Living:  Patient reports morning stiffness for *** {minute/hour:19697}.   Patient {ACTIONS;DENIES/REPORTS:21021675::"Denies"} nocturnal pain.  Difficulty dressing/grooming: {ACTIONS;DENIES/REPORTS:21021675::"Denies"} Difficulty climbing stairs: {ACTIONS;DENIES/REPORTS:21021675::"Denies"} Difficulty getting out of chair: {ACTIONS;DENIES/REPORTS:21021675::"Denies"} Difficulty using hands for taps, buttons, cutlery, and/or writing: {ACTIONS;DENIES/REPORTS:21021675::"Denies"}   No Rheumatology ROS completed.   PMFS History:  Patient Active Problem List   Diagnosis Date Noted  . Rheumatoid arthritis involving multiple sites with positive rheumatoid factor ([place]) 08/18/2016  . Swollen wrist, left 08/18/2016  . 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 Primary osteoarthritis of both feet 08/18/2016  . OP (osteoporosis) 08/18/2016  . Degenerative joint disease involving multiple joints 08/18/2016  . High risk medication use 08/18/2016    Past Medical History:  Diagnosis Date  . Asthma   . RA (rheumatoid arthritis) ([place])     No family history on file. No past surgical history on file. Social History   Social History Narrative  . Not on file     Objective: Vital Signs: There were no vitals taken for this visit.   Physical Exam   Musculoskeletal Exam: ***  CDAI Exam: No CDAI exam completed.    Investigation: No additional findings.TB Gold: 07/29/2016 Negative  CBC Latest Ref Rng & Units 03/31/2017 09/23/2016 06/23/2016  WBC 3.4 - 10.8 x10E3/uL 8.4 7.8 8.8  Hemoglobin 11.1 - 15.9 g/dL 12.9 12.4  12.9  Hematocrit 34.0 - 46.6 % 39.3 36.9 39.8  Platelets 150 - 379 x10E3/uL 195 183 183   CMP Latest Ref Rng & Units 03/31/2017 09/23/2016 06/23/2016  Glucose 65 - 99 mg/dL 77 66 89  BUN 6 - 24 mg/dL 15 11 13   Creatinine 0.57 - 1.00 mg/dL 0.88 0.91 0.86  Sodium 134 - 144 mmol/L 142 142 142  Potassium 3.5 - 5.2 mmol/L 4.2 4.6 3.6  Chloride 96 - 106 mmol/L 106 105 102  CO2 20 - 29 mmol/L 21 24 24   Calcium 8.7 - 10.2 mg/dL 9.0 9.2 9.0  Total Protein 6.0 - 8.5 g/dL 6.9 6.6 6.7  Total Bilirubin 0.0 - 1.2 mg/dL 0.3 0.4 0.3  Alkaline Phos 39 - 117 IU/L 103 105 106  AST 0 - 40 IU/L 17 14 16   ALT 0 - 32 IU/L 12 14 11     Imaging: No results found.  Speciality Comments: No specialty comments available.    Procedures:  No procedures performed Allergies: Doxycycline   Assessment / Plan:     Visit Diagnoses: No diagnosis found.    Orders: No orders of the defined types were placed in this encounter.  No orders of the defined types were placed in this encounter.   Face-to-face time spent with patient was *** minutes. 50% of time was spent in counseling and coordination of care.  Follow-Up Instructions: No Follow-up on file.   Earnestine Mealing, CMA  Note - This record has been created using Editor, commissioning.  Chart creation errors have been sought, but may not always  have been located. Such  creation errors do not reflect on  the standard of medical care.

## 2017-08-14 ENCOUNTER — Other Ambulatory Visit: Payer: Self-pay | Admitting: Rheumatology

## 2017-08-17 ENCOUNTER — Ambulatory Visit: Payer: BC Managed Care – PPO | Admitting: Rheumatology

## 2017-08-17 ENCOUNTER — Telehealth: Payer: Self-pay | Admitting: Rheumatology

## 2017-08-17 ENCOUNTER — Other Ambulatory Visit: Payer: Self-pay | Admitting: *Deleted

## 2017-08-17 DIAGNOSIS — Z9225 Personal history of immunosupression therapy: Secondary | ICD-10-CM

## 2017-08-17 DIAGNOSIS — Z79899 Other long term (current) drug therapy: Secondary | ICD-10-CM

## 2017-08-17 NOTE — Telephone Encounter (Signed)
Patient advised she is due for labs and her lab orders have been released.

## 2017-08-17 NOTE — Progress Notes (Deleted)
Office Visit Note  Patient: Dawn Meyer             Date of Birth: [birth year]           MRN: [id number]             PCP: Mikey Kirschner, MD Referring: Mikey Kirschner, MD Visit Date: 08/31/2017 Occupation: @GUAROCC @    Subjective:  No chief complaint on file.   History of Present Illness: Dawn Meyer is a 62 y.o. female ***   Activities of Daily Living:  Patient reports morning stiffness for *** {minute/hour:19697}.   Patient {ACTIONS;DENIES/REPORTS:21021675::"Denies"} nocturnal pain.  Difficulty dressing/grooming: {ACTIONS;DENIES/REPORTS:21021675::"Denies"} Difficulty climbing stairs: {ACTIONS;DENIES/REPORTS:21021675::"Denies"} Difficulty getting out of chair: {ACTIONS;DENIES/REPORTS:21021675::"Denies"} Difficulty using hands for taps, buttons, cutlery, and/or writing: {ACTIONS;DENIES/REPORTS:21021675::"Denies"}   No Rheumatology ROS completed.   PMFS History:  Patient Active Problem List   Diagnosis Date Noted  . Rheumatoid arthritis involving multiple sites with positive rheumatoid factor ([place]) 08/18/2016  . Swollen wrist, left 08/18/2016  . 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 Primary osteoarthritis of both feet 08/18/2016  . OP (osteoporosis) 08/18/2016  . Degenerative joint disease involving multiple joints 08/18/2016  . High risk medication use 08/18/2016    Past Medical History:  Diagnosis Date  . Asthma   . RA (rheumatoid arthritis) ([place])     No family history on file. No past surgical history on file. Social History   Social History Narrative  . Not on file     Objective: Vital Signs: There were no vitals taken for this visit.   Physical Exam   Musculoskeletal Exam: ***  CDAI Exam: No CDAI exam completed.    Investigation: No additional findings. No chief complaint on file.  CBC Latest Ref Rng & Units 03/31/2017 09/23/2016 06/23/2016  WBC 3.4 - 10.8 x10E3/uL 8.4 7.8 8.8  Hemoglobin 11.1 - 15.9 g/dL 12.9  12.4 12.9  Hematocrit 34.0 - 46.6 % 39.3 36.9 39.8  Platelets 150 - 379 x10E3/uL 195 183 183   CMP Latest Ref Rng & Units 03/31/2017 09/23/2016 06/23/2016  Glucose 65 - 99 mg/dL 77 66 89  BUN 6 - 24 mg/dL 15 11 13   Creatinine 0.57 - 1.00 mg/dL 0.88 0.91 0.86  Sodium 134 - 144 mmol/L 142 142 142  Potassium 3.5 - 5.2 mmol/L 4.2 4.6 3.6  Chloride 96 - 106 mmol/L 106 105 102  CO2 20 - 29 mmol/L 21 24 24   Calcium 8.7 - 10.2 mg/dL 9.0 9.2 9.0  Total Protein 6.0 - 8.5 g/dL 6.9 6.6 6.7  Total Bilirubin 0.0 - 1.2 mg/dL 0.3 0.4 0.3  Alkaline Phos 39 - 117 IU/L 103 105 106  AST 0 - 40 IU/L 17 14 16   ALT 0 - 32 IU/L 12 14 11     Imaging: No results found.  Speciality Comments: No specialty comments available.    Procedures:  No procedures performed Allergies: Doxycycline   Assessment / Plan:     Visit Diagnoses: Rheumatoid arthritis involving multiple sites with positive rheumatoid factor (HCC)  High risk medication use - Humira 40 mg every other week CBC and CMP due TB gold: due   Primary osteoarthritis of both hands  Primary osteoarthritis of both feet  Age-related osteoporosis without current pathological fracture - discussed Reclast    Orders: No orders of the defined types were placed in this encounter.  No orders of the defined types were placed in this encounter.   Face-to-face time spent with patient was *** minutes.  50% of time was spent in counseling and coordination of care.  Follow-Up Instructions: No follow-ups on file.   Ofilia Neas, PA-C  Note - This record has been created using Dragon software.  Chart creation errors have been sought, but may not always  have been located. Such creation errors do not reflect on  the standard of medical care.

## 2017-08-17 NOTE — Telephone Encounter (Addendum)
Last Visit: 08/18/16 Next Visit: 08/31/17 Labs: 03/31/2017 WNL TB Gold: 07/29/16 Neg  Patient advised she is due for labs.  Okay to refill 30 day supply per Dr. Estanislado Pandy

## 2017-08-17 NOTE — Telephone Encounter (Signed)
Patient called stating that she had to reschedule her appointment to 4/16 and is checking to see if she needs to have her labwork done before that visit or if it can be done at the appointment.

## 2017-08-25 NOTE — Progress Notes (Deleted)
Office Visit Note  Patient: Dawn Meyer             Date of Birth: [birth year]           MRN: [id number]             PCP: Mikey Kirschner, MD Referring: Mikey Kirschner, MD Visit Date: 09/08/2017 Occupation: @GUAROCC @    Subjective:  No chief complaint on file.   History of Present Illness: Dawn Meyer is a 62 y.o. female ***   Activities of Daily Living:  Patient reports morning stiffness for *** {minute/hour:19697}.   Patient {ACTIONS;DENIES/REPORTS:21021675::"Denies"} nocturnal pain.  Difficulty dressing/grooming: {ACTIONS;DENIES/REPORTS:21021675::"Denies"} Difficulty climbing stairs: {ACTIONS;DENIES/REPORTS:21021675::"Denies"} Difficulty getting out of chair: {ACTIONS;DENIES/REPORTS:21021675::"Denies"} Difficulty using hands for taps, buttons, cutlery, and/or writing: {ACTIONS;DENIES/REPORTS:21021675::"Denies"}   No Rheumatology ROS completed.   PMFS History:  Patient Active Problem List   Diagnosis Date Noted  . Rheumatoid arthritis involving multiple sites with positive rheumatoid factor ([place]) 08/18/2016  . Swollen wrist, left 08/18/2016  . 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 Primary osteoarthritis of both feet 08/18/2016  . OP (osteoporosis) 08/18/2016  . Degenerative joint disease involving multiple joints 08/18/2016  . High risk medication use 08/18/2016    Past Medical History:  Diagnosis Date  . Asthma   . RA (rheumatoid arthritis) ([place])     No family history on file. No past surgical history on file. Social History   Social History Narrative  . Not on file     Objective: Vital Signs: There were no vitals taken for this visit.   Physical Exam   Musculoskeletal Exam: ***  CDAI Exam: No CDAI exam completed.    Investigation: No additional findings. CBC Latest Ref Rng & Units 03/31/2017 09/23/2016 06/23/2016  WBC 3.4 - 10.8 x10E3/uL 8.4 7.8 8.8  Hemoglobin 11.1 - 15.9 g/dL 12.9 12.4 12.9  Hematocrit 34.0 - 46.6  % 39.3 36.9 39.8  Platelets 150 - 379 x10E3/uL 195 183 183   CMP Latest Ref Rng & Units 03/31/2017 09/23/2016 06/23/2016  Glucose 65 - 99 mg/dL 77 66 89  BUN 6 - 24 mg/dL 15 11 13   Creatinine 0.57 - 1.00 mg/dL 0.88 0.91 0.86  Sodium 134 - 144 mmol/L 142 142 142  Potassium 3.5 - 5.2 mmol/L 4.2 4.6 3.6  Chloride 96 - 106 mmol/L 106 105 102  CO2 20 - 29 mmol/L 21 24 24   Calcium 8.7 - 10.2 mg/dL 9.0 9.2 9.0  Total Protein 6.0 - 8.5 g/dL 6.9 6.6 6.7  Total Bilirubin 0.0 - 1.2 mg/dL 0.3 0.4 0.3  Alkaline Phos 39 - 117 IU/L 103 105 106  AST 0 - 40 IU/L 17 14 16   ALT 0 - 32 IU/L 12 14 11     Imaging: No results found.  Speciality Comments: No specialty comments available.    Procedures:  No procedures performed Allergies: Doxycycline   Assessment / Plan:     Visit Diagnoses: Rheumatoid arthritis involving multiple sites with positive rheumatoid factor (HCC) - CCP+  High risk medication use - Humira TB gold dueCBC/CMP: due  Primary osteoarthritis of both hands  Primary osteoarthritis of both feet  History of osteoporosis  History of asthma    Orders: No orders of the defined types were placed in this encounter.  No orders of the defined types were placed in this encounter.   Face-to-face time spent with patient was *** minutes. 50% of time was spent in counseling and coordination of care.  Follow-Up Instructions: No  follow-ups on file.   Ofilia Neas, PA-C  Note - This record has been created using Dragon software.  Chart creation errors have been sought, but may not always  have been located. Such creation errors do not reflect on  the standard of medical care.

## 2017-08-26 NOTE — Progress Notes (Signed)
Within normal limits

## 2017-08-28 LAB — CBC WITH DIFFERENTIAL/PLATELET
BASOS ABS: 0 10*3/uL (ref 0.0–0.2)
Basos: 0 %
EOS (ABSOLUTE): 0.6 10*3/uL — ABNORMAL HIGH (ref 0.0–0.4)
Eos: 7 %
HEMOGLOBIN: 12.9 g/dL (ref 11.1–15.9)
Hematocrit: 39.4 % (ref 34.0–46.6)
Immature Grans (Abs): 0 10*3/uL (ref 0.0–0.1)
Immature Granulocytes: 0 %
Lymphocytes Absolute: 3.4 10*3/uL — ABNORMAL HIGH (ref 0.7–3.1)
Lymphs: 39 %
MCH: 29.8 pg (ref 26.6–33.0)
MCHC: 32.7 g/dL (ref 31.5–35.7)
MCV: 91 fL (ref 79–97)
MONOCYTES: 7 %
Monocytes Absolute: 0.6 10*3/uL (ref 0.1–0.9)
NEUTROS PCT: 47 %
Neutrophils Absolute: 4 10*3/uL (ref 1.4–7.0)
Platelets: 208 10*3/uL (ref 150–379)
RBC: 4.33 x10E6/uL (ref 3.77–5.28)
RDW: 14.2 % (ref 12.3–15.4)
WBC: 8.5 10*3/uL (ref 3.4–10.8)

## 2017-08-28 LAB — QUANTIFERON-TB GOLD PLUS
QUANTIFERON TB1 AG VALUE: 0.1 [IU]/mL
QUANTIFERON TB2 AG VALUE: 0.09 [IU]/mL
QUANTIFERON-TB GOLD PLUS: NEGATIVE
QuantiFERON Nil Value: 0.12 IU/mL

## 2017-08-28 LAB — CMP14+EGFR
A/G RATIO: 1.4 (ref 1.2–2.2)
ALT: 16 IU/L (ref 0–32)
AST: 16 IU/L (ref 0–40)
Albumin: 4 g/dL (ref 3.5–5.5)
Alkaline Phosphatase: 105 IU/L (ref 39–117)
BUN/Creatinine Ratio: 14 (ref 9–23)
BUN: 12 mg/dL (ref 6–24)
Bilirubin Total: 0.4 mg/dL (ref 0.0–1.2)
CALCIUM: 9.6 mg/dL (ref 8.7–10.2)
CO2: 24 mmol/L (ref 20–29)
CREATININE: 0.87 mg/dL (ref 0.57–1.00)
Chloride: 104 mmol/L (ref 96–106)
GFR calc Af Amer: 84 mL/min/{1.73_m2} (ref >59)
GFR, EST NON AFRICAN AMERICAN: 73 mL/min/{1.73_m2} (ref >59)
Globulin, Total: 2.9 g/dL (ref 1.5–4.5)
Glucose: 80 mg/dL (ref 65–99)
POTASSIUM: 4.2 mmol/L (ref 3.5–5.2)
Sodium: 142 mmol/L (ref 134–144)
Total Protein: 6.9 g/dL (ref 6.0–8.5)

## 2017-08-31 ENCOUNTER — Ambulatory Visit: Payer: BC Managed Care – PPO | Admitting: Physician Assistant

## 2017-09-03 NOTE — Progress Notes (Signed)
Office Visit Note  Patient: Dawn Meyer             Date of Birth: Nov 23, 1955           MRN: [id number]             PCP: Mikey Kirschner, MD Referring: Mikey Kirschner, MD Visit Date: 09/17/2017 Occupation: @GUAROCC @    Subjective:  Discuss medication   History of Present Illness: Dawn Meyer is a 62 y.o. female with history of seropositive rheumatoid arthritis and osteoporosis.  She continues to inject Humira subcutaneously every other week.  She has not missed any doses recently.  She states that on Tuesday she was started on Augmentin for treatment of sinusitis.  She states that she is due for her next Humira injection on Saturday.  She states that her PCP has referred her to ENT due to the inflammation within her sinus cavity.  She states that she continues to have some discomfort in her right second MCP joint.  She denies any other joint pain or joint swelling at this time.  She denies any joint stiffness.  She says she continues to go to the gym twice a week and does weight lifting and strengthening exercises. She has a chronic rash on her back and has seen her dermatologist.  She has tried several topical creams which have not resolved her symptoms.  To follow back up with her dermatologist soon.    Activities of Daily Living:  Patient reports morning stiffness for 0  minutes.   Patient Denies nocturnal pain.  Difficulty dressing/grooming: Denies Difficulty climbing stairs: Denies Difficulty getting out of chair: Denies Difficulty using hands for taps, buttons, cutlery, and/or writing: Denies   Review of Systems  Constitutional: Negative for fatigue.  HENT: Negative for mouth sores, mouth dryness and nose dryness.   Eyes: Negative for pain, visual disturbance and dryness.  Respiratory: Negative for cough, hemoptysis, shortness of breath and difficulty breathing.   Cardiovascular: Negative for chest pain, palpitations, hypertension and swelling in legs/feet.   Gastrointestinal: Negative for blood in stool, constipation and diarrhea.  Endocrine: Negative for increased urination.  Genitourinary: Negative for painful urination.  Musculoskeletal: Positive for arthralgias, joint pain and joint swelling. Negative for myalgias, muscle weakness, morning stiffness, muscle tenderness and myalgias.  Skin: Positive for rash. Negative for color change, pallor, hair loss, nodules/bumps, skin tightness, ulcers and sensitivity to sunlight.  Allergic/Immunologic: Negative for susceptible to infections.  Neurological: Negative for dizziness, numbness, headaches and weakness.  Hematological: Negative for swollen glands.  Psychiatric/Behavioral: Negative for depressed mood and sleep disturbance. The patient is not nervous/anxious.     PMFS History:  Patient Active Problem List   Diagnosis Date Noted  . Rheumatoid arthritis involving multiple sites with positive rheumatoid factor ([place]) 08/18/2016  . Swollen wrist, left 08/18/2016  . 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 Primary osteoarthritis of both feet 08/18/2016  . OP (osteoporosis) 08/18/2016  . Degenerative joint disease involving multiple joints 08/18/2016  . High risk medication use 08/18/2016    Past Medical History:  Diagnosis Date  . Asthma   . RA (rheumatoid arthritis) (H[facility])     Family History  Problem Relation Age of Onset  . Congestive Heart Failure Mother   . Heart attack Mother   . Diabetes Father   . Heart Problems Father        pacemaker  . Diverticulitis Sister   . Hypertension Sister   . Heart attack Brother   .  Healthy Daughter    Past Surgical History:  Procedure Laterality Date  . VAGINAL DELIVERY     Social History   Social History Narrative  . Not on file     Objective: Vital Signs: BP (!) 170/82 (BP Location: Left Arm, Patient Position: Sitting, Cuff Size: Normal)   Pulse 70   Resp 16   Ht 5\' 7"  (1.702 m)   Wt 145 lb (65.8 kg)   BMI 22.71 kg/m      Physical Exam  Constitutional: She is oriented to person, place, and time. She appears well-developed and well-nourished.  HENT:  Head: Normocephalic and atraumatic.  Eyes: Conjunctivae and EOM are normal.  Neck: Normal range of motion.  Cardiovascular: Normal rate, regular rhythm, normal heart sounds and intact distal pulses.  Pulmonary/Chest: Effort normal and breath sounds normal.  Abdominal: Soft. Bowel sounds are normal.  Lymphadenopathy:    She has no cervical adenopathy.  Neurological: She is alert and oriented to person, place, and time.  Skin: Skin is warm and dry. Capillary refill takes less than 2 seconds.  Psychiatric: She has a normal mood and affect. Her behavior is normal.  Nursing note and vitals reviewed.    Musculoskeletal Exam: C-spine, thoracic spine, lumbar spine good range of motion.  No midline spinal tenderness.  No SI joint tenderness.  Shoulder joints, elbow joints, wrist joints, MCPs, PIPs, DIPs good range of motion with no synovitis.  She has no real thickening of her right second MCP joint.  Hip joints, knee joints, ankle joints, MTPs, PIPs, DIPs good range of motion with no synovitis.  No warmth or effusion of bilateral knees.  She has bilateral knee crepitus.  CDAI Exam: CDAI Homunculus Exam:   Joint Counts:  CDAI Tender Joint count: 0 CDAI Swollen Joint count: 0  Global Assessments:  Patient Global Assessment: 1 Provider Global Assessment: 1  CDAI Calculated Score: 2    Investigation: No additional findings. CBC Latest Ref Rng & Units 09/12/2017 09/09/2017 08/25/2017  WBC 4.0 - 10.5 K/uL 8.2 8.1 8.5  Hemoglobin 12.0 - 15.0 g/dL 14.0 12.9 12.9  Hematocrit 36.0 - 46.0 % 43.6 40.1 39.4  Platelets 150 - 400 K/uL 187 196 208   CMP Latest Ref Rng & Units 09/12/2017 09/09/2017 08/25/2017  Glucose 65 - 99 mg/dL 108(H) 105(H) 80  BUN 6 - 20 mg/dL 16 14 12   Creatinine 0.44 - 1.00 mg/dL 0.89 0.87 0.87  Sodium 135 - 145 mmol/L 145 140 142  Potassium 3.5  - 5.1 mmol/L 3.9 3.8 4.2  Chloride 101 - 111 mmol/L 110 107 104  CO2 22 - 32 mmol/L 24 25 24   Calcium 8.9 - 10.3 mg/dL 9.6 9.1 9.6  Total Protein 6.5 - 8.1 g/dL - 7.1 6.9  Total Bilirubin 0.3 - 1.2 mg/dL - 0.7 0.4  Alkaline Phos 38 - 126 U/L - 102 105  AST 15 - 41 U/L - 15 16  ALT 14 - 54 U/L - 16 16    Imaging: US Venous Img Lower Unilateral Left  Result Date: 09/09/2017 CLINICAL DATA:  Left lower extremity pain and swelling EXAM: LEFT LOWER EXTREMITY VENOUS DOPPLER ULTRASOUND TECHNIQUE: Gray-scale sonography with graded compression, as well as color Doppler and duplex ultrasound were performed to evaluate the lower extremity deep venous systems from the level of the common femoral vein and including the common femoral, femoral, profunda femoral, popliteal and calf veins including the posterior tibial, peroneal and gastrocnemius veins when visible. The superficial great saphenous vein was  also interrogated. Spectral Doppler was utilized to evaluate flow at rest and with distal augmentation maneuvers in the common femoral, femoral and popliteal veins. COMPARISON:  None. FINDINGS: Contralateral Common Femoral Vein: Respiratory phasicity is normal and symmetric with the symptomatic side. No evidence of thrombus. Normal compressibility. Common Femoral Vein: No evidence of thrombus. Normal compressibility, respiratory phasicity and response to augmentation. Saphenofemoral Junction: No evidence of thrombus. Normal compressibility and flow on color Doppler imaging. Profunda Femoral Vein: No evidence of thrombus. Normal compressibility and flow on color Doppler imaging. Femoral Vein: No evidence of thrombus. Normal compressibility, respiratory phasicity and response to augmentation. Popliteal Vein: No evidence of thrombus. Normal compressibility, respiratory phasicity and response to augmentation. Calf Veins: No evidence of thrombus. Normal compressibility and flow on color Doppler imaging. Superficial Great  Saphenous Vein: No evidence of thrombus. Normal compressibility. Venous Reflux:  None. Other Findings:  None. IMPRESSION: No evidence of deep venous thrombosis. Electronically Signed   By: Inez Catalina M.D.   On: 09/09/2017 16:24   Ct Renal Stone Study  Result Date: 09/12/2017 CLINICAL DATA:  Hematuria, dizziness and lightheadedness. History of rheumatoid arthritis. EXAM: CT ABDOMEN AND PELVIS WITHOUT CONTRAST TECHNIQUE: Multidetector CT imaging of the abdomen and pelvis was performed following the standard protocol without IV contrast. COMPARISON:  None. FINDINGS: LOWER CHEST: Dependent atelectasis. The visualized heart size is normal. No pericardial effusion. HEPATOBILIARY: Normal. PANCREAS: Normal. SPLEEN: Spleen is top-normal in size. ADRENALS/URINARY TRACT: Kidneys are orthotopic, demonstrating normal size and morphology. Too small to characterize hypodensity upper pole LEFT kidney. No nephrolithiasis, hydronephrosis; limited assessment for renal masses on this nonenhanced examination. The unopacified ureters are normal in course and caliber. Urinary bladder is partially distended and unremarkable. Normal adrenal glands. STOMACH/BOWEL: Very small hiatal hernia. The stomach, small and large bowel are normal in course and caliber without inflammatory changes, sensitivity decreased by lack of enteric contrast. Mild amount of retained large bowel stool. Normal appendix. VASCULAR/LYMPHATIC: Aortoiliac vessels are normal in course and caliber. Moderate calcific atherosclerosis. No lymphadenopathy by CT size criteria. REPRODUCTIVE: Normal. OTHER: No intraperitoneal free fluid or free air. MUSCULOSKELETAL: Non-acute. Osteopenia. Levoscoliosis. Grade 1 L2-3 retrolisthesis, severe spondylosis without spondylolysis. Severe T11-12 degenerative disc. Subcentimeter probable bone island LEFT posterior twelfth rib. Small fat containing umbilical hernia. IMPRESSION: 1. No nephrolithiasis, hydronephrosis nor acute  intra-abdominal/pelvic process. 2. Borderline splenomegaly. Aortic Atherosclerosis (ICD10-I70.0). Electronically Signed   By: Elon Alas M.D.   On: 09/12/2017 17:12    Speciality Comments: No specialty comments available.    Procedures:  No procedures performed Allergies: Doxycycline   Assessment / Plan:     Visit Diagnoses: Rheumatoid arthritis involving multiple sites with positive rheumatoid factor ([place]): She has no synovitis on exam today.  She continues to have synovial thickening of her right MCP joint.  Her right second MCP joint is the only joints that causes occasional discomfort.  She has not had any recent flares of rheumatoid arthritis. She denies any other joint pain or joint swelling at this time.  She has been injecting Humira subcutaneously every other week.  She was started on Augmentin for treatment of sinusitis on Tuesday by her PCP.  She is due for her next dose of Humira on Saturday.  She was advised to not resume her Humira until she has completed her antibiotics and her sinusitis has resolved.  Her PCP is referring her to ENT for further evaluation due to recurrent sinusitis.  She would like to start spacing her Humira injections to every  3 weeks.  She was advised to inform us if she develops increased joint pain or joint swelling.   High risk medication use - Humira Pen-40mg /0.8ML every other weekTB gold negative 4/2/19CBC and CMP: 08/25/17.  She will return in July and every 3 months to monitor for drug toxicity.  Primary osteoarthritis of both hands: She has mild PIP and DIP synovial thickening consistent with osteoarthritis.  She has no discomfort at this time.  Joint protection and muscle strengthening were discussed.    Primary osteoarthritis of both feet: She has no discomfort at this time.  She was proper fitting shoes.  Other osteoporosis without current pathological fracture  History of asthma    Orders: No orders of the defined types were placed in  this encounter.  No orders of the defined types were placed in this encounter.   Face-to-face time spent with patient was 30 minutes. >50% of time was spent in counseling and coordination of care.  Follow-Up Instructions: Return in about 5 months (around 02/17/2018) for Rheumatoid arthritis, Osteoarthritis.   Ofilia Neas, PA-C  Note - This record has been created using Dragon software.  Chart creation errors have been sought, but may not always  have been located. Such creation errors do not reflect on  the standard of medical care.

## 2017-09-08 ENCOUNTER — Ambulatory Visit: Payer: BC Managed Care – PPO | Admitting: Physician Assistant

## 2017-09-09 ENCOUNTER — Ambulatory Visit: Payer: BC Managed Care – PPO | Admitting: Family Medicine

## 2017-09-09 ENCOUNTER — Encounter (HOSPITAL_COMMUNITY): Payer: Self-pay | Admitting: Emergency Medicine

## 2017-09-09 ENCOUNTER — Emergency Department (HOSPITAL_COMMUNITY)
Admission: EM | Admit: 2017-09-09 | Discharge: 2017-09-09 | Disposition: A | Discharge status: Home / Self Care | Payer: BC Managed Care – PPO | Attending: Emergency Medicine | Admitting: Emergency Medicine

## 2017-09-09 ENCOUNTER — Emergency Department (HOSPITAL_COMMUNITY): Payer: BC Managed Care – PPO

## 2017-09-09 ENCOUNTER — Other Ambulatory Visit: Payer: Self-pay

## 2017-09-09 DIAGNOSIS — Z79899 Other long term (current) drug therapy: Secondary | ICD-10-CM | POA: Insufficient documentation

## 2017-09-09 DIAGNOSIS — M79605 Pain in left leg: Secondary | ICD-10-CM

## 2017-09-09 DIAGNOSIS — M79662 Pain in left lower leg: Secondary | ICD-10-CM | POA: Diagnosis present

## 2017-09-09 DIAGNOSIS — R5383 Other fatigue: Secondary | ICD-10-CM | POA: Diagnosis not present

## 2017-09-09 DIAGNOSIS — J45909 Unspecified asthma, uncomplicated: Secondary | ICD-10-CM | POA: Insufficient documentation

## 2017-09-09 DIAGNOSIS — F17200 Nicotine dependence, unspecified, uncomplicated: Secondary | ICD-10-CM | POA: Diagnosis not present

## 2017-09-09 LAB — URINALYSIS, ROUTINE W REFLEX MICROSCOPIC
BILIRUBIN URINE: NEGATIVE
Glucose, UA: NEGATIVE mg/dL
KETONES UR: NEGATIVE mg/dL
NITRITE: NEGATIVE
PH: 5 (ref 5.0–8.0)
Protein, ur: NEGATIVE mg/dL
Specific Gravity, Urine: 1.008 (ref 1.005–1.030)

## 2017-09-09 LAB — CBC WITH DIFFERENTIAL/PLATELET
Basophils Absolute: 0 10*3/uL (ref 0.0–0.1)
Basophils Relative: 0 %
EOS ABS: 0.4 10*3/uL (ref 0.0–0.7)
EOS PCT: 5 %
HCT: 40.1 % (ref 36.0–46.0)
Hemoglobin: 12.9 g/dL (ref 12.0–15.0)
LYMPHS ABS: 3.7 10*3/uL (ref 0.7–4.0)
LYMPHS PCT: 46 %
MCH: 29.9 pg (ref 26.0–34.0)
MCHC: 32.2 g/dL (ref 30.0–36.0)
MCV: 92.8 fL (ref 78.0–100.0)
MONO ABS: 0.5 10*3/uL (ref 0.1–1.0)
Monocytes Relative: 6 %
Neutro Abs: 3.4 10*3/uL (ref 1.7–7.7)
Neutrophils Relative %: 43 %
PLATELETS: 196 10*3/uL (ref 150–400)
RBC: 4.32 MIL/uL (ref 3.87–5.11)
RDW: 13.4 % (ref 11.5–15.5)
WBC: 8.1 10*3/uL (ref 4.0–10.5)

## 2017-09-09 LAB — COMPREHENSIVE METABOLIC PANEL
ALT: 16 U/L (ref 14–54)
ANION GAP: 8 (ref 5–15)
AST: 15 U/L (ref 15–41)
Albumin: 3.6 g/dL (ref 3.5–5.0)
Alkaline Phosphatase: 102 U/L (ref 38–126)
BUN: 14 mg/dL (ref 6–20)
CHLORIDE: 107 mmol/L (ref 101–111)
CO2: 25 mmol/L (ref 22–32)
CREATININE: 0.87 mg/dL (ref 0.44–1.00)
Calcium: 9.1 mg/dL (ref 8.9–10.3)
Glucose, Bld: 105 mg/dL — ABNORMAL HIGH (ref 65–99)
Potassium: 3.8 mmol/L (ref 3.5–5.1)
Sodium: 140 mmol/L (ref 135–145)
Total Bilirubin: 0.7 mg/dL (ref 0.3–1.2)
Total Protein: 7.1 g/dL (ref 6.5–8.1)

## 2017-09-09 LAB — TROPONIN I

## 2017-09-09 NOTE — Discharge Instructions (Signed)
Your testing today was unremarkable showing no signs of blood clot, normal blood work, normal EKG, no signs of heart attack.  It would be wise to follow-up with your doctor in the next couple of days and have a referral to cardiology for a stress test.  If you should develop severe or worsening fatigue, weakness, sweating, chest pain or difficulty breathing you should return to the emergency department immediately.

## 2017-09-09 NOTE — ED Triage Notes (Signed)
Pt c/o of left leg pain with a "knot"  x 2 days with dizziness and lightheaded.  Not tender to touch and not on blood thinners.

## 2017-09-09 NOTE — ED Notes (Signed)
Pt says she has had discomfort and pain to left lateral leg on and off for months.  Pain got worse yesterday while walking in the hall, accompanied some swelling.  There is no swelling today but does have discomfort to left lateral leg.  Says she feels tired.

## 2017-09-09 NOTE — ED Provider Notes (Signed)
[facility] EMERGENCY DEPARTMENT Provider Note   CSN: [id number] Arrival date & time: 09/09/17  1053     History   Chief Complaint Chief Complaint  Patient presents with  . Leg Pain    HPI SEQUITA WISE is a 62 y.o. female.  HPI  62 year old female, known history of rheumatoid arthritis for which she gets Humira injections monthly, she presents to the hospital today complaining of multiple symptoms stating that specifically her left knee has been bothering her laterally around the fibular head occasionally becoming swollen and tender and blue and sometimes it goes back to normal however she has had some recent travel back and forth in the car to [place] and has noticed increased pain when that occurs.  At this time she feels minimal symptoms at the left knee.  Additionally she complains of some fatigue, diaphoresis and feeling sweaty yesterday when she was walking while shopping.  She reports that she does this fairly frequently, usually does not have any trouble doing it but yesterday felt fatigued, came home and fell asleep immediately, has continued to feel mild fatigue today though it is better.  She denies chest pain coughing shortness of breath, denies dysuria diarrhea or rectal bleeding, denies nausea or vomiting and has had a normal appetite.  She denies any personal history of cardiac disease though she does have multiple family members that have had cardiac disease including her brother 4 years her younger who recently had 2 stents placed after becoming uncomfortable while working in the yard.  She has not had any evaluation for this illness prior to this evaluation, additional history was obtained from the husband who corroborates the patient's story.  Past Medical History:  Diagnosis Date  . Asthma   . RA (rheumatoid arthritis) [facility])     Patient Active Problem List   Diagnosis Date Noted  . Rheumatoid arthritis involving multiple sites with positive rheumatoid factor  ([place]) 08/18/2016  . Swollen wrist, left 08/18/2016  . 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 Primary osteoarthritis of both feet 08/18/2016  . OP (osteoporosis) 08/18/2016  . Degenerative joint disease involving multiple joints 08/18/2016  . High risk medication use 08/18/2016    Past Surgical History:  Procedure Laterality Date  . VAGINAL DELIVERY       OB History   None      Home Medications    Prior to Admission medications   Medication Sig Start Date End Date Taking? Authorizing Provider  Adalimumab (HUMIRA PEN) 40 MG/0.4ML PNKT Inject 40 mg into the skin every 14 (fourteen) days. 04/29/17   Bo Merino, MD  alendronate (FOSAMAX) 70 MG tablet  04/25/15   [provider]  amoxicillin-clavulanate (AUGMENTIN) 875-125 MG tablet Take 1 tablet by mouth 2 (two) times daily. 03/19/17   Mikey Kirschner, MD  fluconazole (DIFLUCAN) 150 MG tablet Take 1 tablet by mouth 3 days apart. 04/09/17   Mikey Kirschner, MD  fluconazole (DIFLUCAN) 150 MG tablet Take one tablet three days apart 07/23/17   Mikey Kirschner, MD  HUMIRA PEN 40 MG/0.4ML PNKT INJECT ONE PEN SUBCUTANEOUSLY EVERY 14 DAYS 08/17/17   Bo Merino, MD  PROAIR HFA [phone] Base) MCG/ACT inhaler INHALE TWO PUFFS INTO THE LUNGS EVERY SIX HOURS AS NEEDED FOR WHEEZING OR SHORTNESS OF BREATH 01/14/17   Mikey Kirschner, MD    Family History No family history on file.  Social History Social History   Tobacco Use  . Smoking status: Current Every Day  Smoker    Packs/day: 1.00  . Smokeless tobacco: Never Used  Substance Use Topics  . Alcohol use: No  . Drug use: No     Allergies   Doxycycline   Review of Systems Review of Systems  All other systems reviewed and are negative.    Physical Exam Updated Vital Signs BP (!) 158/75   Pulse 60   Temp 98 F (36.7 C) (Oral)   Resp 18   Ht 5' 7.5" (1.715 m)   Wt 68 kg (150 lb)   SpO2 100%   BMI 23.15 kg/m   Physical Exam    Constitutional: She appears well-developed and well-nourished. No distress.  HENT:  Head: Normocephalic and atraumatic.  Mouth/Throat: Oropharynx is clear and moist. No oropharyngeal exudate.  Eyes: Pupils are equal, round, and reactive to light. Conjunctivae and EOM are normal. Right eye exhibits no discharge. Left eye exhibits no discharge. No scleral icterus.  Neck: Normal range of motion. Neck supple. No JVD present. No thyromegaly present.  Cardiovascular: Normal rate, regular rhythm, normal heart sounds and intact distal pulses. Exam reveals no gallop and no friction rub.  No murmur heard. Pulmonary/Chest: Effort normal and breath sounds normal. No respiratory distress. She has no wheezes. She has no rales.  Abdominal: Soft. Bowel sounds are normal. She exhibits no distension and no mass. There is no tenderness.  Musculoskeletal: Normal range of motion. She exhibits no edema or tenderness.  Bilateral lower extremities without any edema swelling or bruising.  The left knee specifically has normal range of motion without any tenderness or edema  Lymphadenopathy:    She has no cervical adenopathy.  Neurological: She is alert. Coordination normal.  Clear speech coordination strength and sensation.  No facial droop  Skin: Skin is warm and dry. No rash noted. No erythema.  Psychiatric: She has a normal mood and affect. Her behavior is normal.  Nursing note and vitals reviewed.    ED Treatments / Results  Labs (all labs ordered are listed, but only abnormal results are displayed) Labs Reviewed  COMPREHENSIVE METABOLIC PANEL - Abnormal; Notable for the following components:      Result Value   Glucose, Bld 105 (*)    All other components within normal limits  URINALYSIS, ROUTINE W REFLEX MICROSCOPIC - Abnormal; Notable for the following components:   Color, Urine STRAW (*)    Hgb urine dipstick MODERATE (*)    Leukocytes, UA TRACE (*)    Bacteria, UA RARE (*)    Squamous Epithelial  / LPF 0-5 (*)    All other components within normal limits  CBC WITH DIFFERENTIAL/PLATELET  TROPONIN I    EKG EKG Interpretation  Date/Time:  Wednesday September 09 2017 11:11:14 EDT Ventricular Rate:  68 PR Interval:  134 QRS Duration: 76 QT Interval:  418 QTC Calculation: 444 R Axis:   67 Text Interpretation:  Normal sinus rhythm Nonspecific ST abnormality Abnormal ECG No old tracing to compare Confirmed by Noemi Chapel [phone]) on 09/09/2017 3:26:31 PM   Radiology US Venous Img Lower Unilateral Left  Result Date: 09/09/2017 CLINICAL DATA:  Left lower extremity pain and swelling EXAM: LEFT LOWER EXTREMITY VENOUS DOPPLER ULTRASOUND TECHNIQUE: Gray-scale sonography with graded compression, as well as color Doppler and duplex ultrasound were performed to evaluate the lower extremity deep venous systems from the level of the common femoral vein and including the common femoral, femoral, profunda femoral, popliteal and calf veins including the posterior tibial, peroneal and gastrocnemius veins when visible. The  superficial great saphenous vein was also interrogated. Spectral Doppler was utilized to evaluate flow at rest and with distal augmentation maneuvers in the common femoral, femoral and popliteal veins. COMPARISON:  None. FINDINGS: Contralateral Common Femoral Vein: Respiratory phasicity is normal and symmetric with the symptomatic side. No evidence of thrombus. Normal compressibility. Common Femoral Vein: No evidence of thrombus. Normal compressibility, respiratory phasicity and response to augmentation. Saphenofemoral Junction: No evidence of thrombus. Normal compressibility and flow on color Doppler imaging. Profunda Femoral Vein: No evidence of thrombus. Normal compressibility and flow on color Doppler imaging. Femoral Vein: No evidence of thrombus. Normal compressibility, respiratory phasicity and response to augmentation. Popliteal Vein: No evidence of thrombus. Normal compressibility,  respiratory phasicity and response to augmentation. Calf Veins: No evidence of thrombus. Normal compressibility and flow on color Doppler imaging. Superficial Great Saphenous Vein: No evidence of thrombus. Normal compressibility. Venous Reflux:  None. Other Findings:  None. IMPRESSION: No evidence of deep venous thrombosis. Electronically Signed   By: Inez Catalina M.D.   On: 09/09/2017 16:24    Procedures Procedures (including critical care time)  Medications Ordered in ED Medications - No data to display   Initial Impression / Assessment and Plan / ED Course  I have reviewed the triage vital signs and the nursing notes.  Pertinent labs & imaging results that were available during my care of the patient were reviewed by me and considered in my medical decision making (see chart for details).  Clinical Course as of Sep 09 1799  Wed Sep 09, 2017  1715 Ultrasound negative for DVT, urinalysis without signs of infection, labs pending   [BM]    Clinical Course User Index [BM] Noemi Chapel, MD   Overall the EKG is unremarkable, the patient will need to have further evaluation, labs have been sent and ultrasound will be ordered for her leg she seems to be perseverating on the thought that she might have a blood clot.  She does not appear hemodynamically unstable at all.  She is agreeable to the workup  Clear the labs are unremarkable including the troponin, metabolic panel and CBC.  The patient was informed of these results, I told her that she needs to have further follow-up for a stress test if she continues to have symptoms, she is asymptomatic at discharge.  Final Clinical Impressions(s) / ED Diagnoses   Final diagnoses:  Left leg pain  Other fatigue    ED Discharge Orders    None       Noemi Chapel, MD 09/09/17 1801

## 2017-09-09 NOTE — ED Notes (Signed)
Dr Miller at bedside. 

## 2017-09-09 NOTE — ED Notes (Signed)
Labs being drawn 

## 2017-09-12 ENCOUNTER — Emergency Department (HOSPITAL_COMMUNITY): Payer: BC Managed Care – PPO

## 2017-09-12 ENCOUNTER — Emergency Department (HOSPITAL_COMMUNITY)
Admission: EM | Admit: 2017-09-12 | Discharge: 2017-09-12 | Disposition: A | Discharge status: Home / Self Care | Payer: BC Managed Care – PPO | Attending: Emergency Medicine | Admitting: Emergency Medicine

## 2017-09-12 ENCOUNTER — Encounter (HOSPITAL_COMMUNITY): Payer: Self-pay | Admitting: Emergency Medicine

## 2017-09-12 ENCOUNTER — Other Ambulatory Visit: Payer: Self-pay

## 2017-09-12 DIAGNOSIS — D49512 Neoplasm of unspecified behavior of left kidney: Secondary | ICD-10-CM | POA: Diagnosis not present

## 2017-09-12 DIAGNOSIS — R319 Hematuria, unspecified: Secondary | ICD-10-CM

## 2017-09-12 DIAGNOSIS — R42 Dizziness and giddiness: Secondary | ICD-10-CM

## 2017-09-12 DIAGNOSIS — F17200 Nicotine dependence, unspecified, uncomplicated: Secondary | ICD-10-CM | POA: Insufficient documentation

## 2017-09-12 DIAGNOSIS — J45909 Unspecified asthma, uncomplicated: Secondary | ICD-10-CM | POA: Diagnosis not present

## 2017-09-12 DIAGNOSIS — N289 Disorder of kidney and ureter, unspecified: Secondary | ICD-10-CM

## 2017-09-12 LAB — BASIC METABOLIC PANEL
Anion gap: 11 (ref 5–15)
BUN: 16 mg/dL (ref 6–20)
CALCIUM: 9.6 mg/dL (ref 8.9–10.3)
CO2: 24 mmol/L (ref 22–32)
CREATININE: 0.89 mg/dL (ref 0.44–1.00)
Chloride: 110 mmol/L (ref 101–111)
GFR calc Af Amer: >60 mL/min (ref >60)
GFR calc non Af Amer: >60 mL/min (ref >60)
Glucose, Bld: 108 mg/dL — ABNORMAL HIGH (ref 65–99)
Potassium: 3.9 mmol/L (ref 3.5–5.1)
SODIUM: 145 mmol/L (ref 135–145)

## 2017-09-12 LAB — URINALYSIS, ROUTINE W REFLEX MICROSCOPIC
Bacteria, UA: NONE SEEN
Bilirubin Urine: NEGATIVE
GLUCOSE, UA: NEGATIVE mg/dL
KETONES UR: NEGATIVE mg/dL
Nitrite: NEGATIVE
PH: 6 (ref 5.0–8.0)
Protein, ur: NEGATIVE mg/dL
SPECIFIC GRAVITY, URINE: 1.011 (ref 1.005–1.030)

## 2017-09-12 LAB — TROPONIN I

## 2017-09-12 LAB — TSH: TSH: 0.702 u[IU]/mL (ref 0.350–4.500)

## 2017-09-12 LAB — CBC
HCT: 43.6 % (ref 36.0–46.0)
Hemoglobin: 14 g/dL (ref 12.0–15.0)
MCH: 29.9 pg (ref 26.0–34.0)
MCHC: 32.1 g/dL (ref 30.0–36.0)
MCV: 93 fL (ref 78.0–100.0)
PLATELETS: 187 10*3/uL (ref 150–400)
RBC: 4.69 MIL/uL (ref 3.87–5.11)
RDW: 13.3 % (ref 11.5–15.5)
WBC: 8.2 10*3/uL (ref 4.0–10.5)

## 2017-09-12 LAB — CBG MONITORING, ED: Glucose-Capillary: 115 mg/dL — ABNORMAL HIGH (ref 65–99)

## 2017-09-12 MED ORDER — LORAZEPAM 1 MG PO TABS: 0.5000 mg | ORAL_TABLET | Freq: Four times a day (QID) | ORAL | 0 refills | Status: DC | PRN

## 2017-09-12 MED ORDER — LORAZEPAM 0.5 MG PO TABS
0.5000 mg | ORAL_TABLET | Freq: Once | ORAL | Status: AC
  Administered 2017-09-12: 0.5 mg via ORAL
  Filled 2017-09-12: qty 1

## 2017-09-12 NOTE — ED Provider Notes (Signed)
Emergency Department Provider Note   I have reviewed the triage vital signs and the nursing notes.   HISTORY  Chief Complaint Dizziness   HPI Dawn Meyer is a 62 y.o. female with a history of rheumatoid arthritis on Humira who presents to the emergency department with multiple symptoms similar to her presentation a few days ago.  Sounds like the last 4 days she is having intermittent episodes of chills that cause her body to shake.  She will also have some episodes of feeling really warm throughout.  She does not take her temperature during these episodes.  She will get very shaky visibly per her and her husband and then this will improve over time.  During this time she had decreased appetite, increased urination, overall malaise and just not feeling herself with this some intermittent nausea.  She never really been sick like this before.  She has no history of anxiety but he does feel some intermittent anxious episodes.  She has some intermittent lower abdominal cramping but no other GI symptoms. No other associated or modifying symptoms.    Past Medical History:  Diagnosis Date  . Asthma   . RA (rheumatoid arthritis) [facility])     Patient Active Problem List   Diagnosis Date Noted  . Rheumatoid arthritis involving multiple sites with positive rheumatoid factor ([place]) 08/18/2016  . Swollen wrist, left 08/18/2016  . 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 Primary osteoarthritis of both feet 08/18/2016  . OP (osteoporosis) 08/18/2016  . Degenerative joint disease involving multiple joints 08/18/2016  . High risk medication use 08/18/2016    Past Surgical History:  Procedure Laterality Date  . VAGINAL DELIVERY      Current Outpatient Rx  . Order #: [id number] Class: Normal  . Order #: [id number] Class: Normal  . Order #: [id number] Class: Print    Allergies Doxycycline  No family history on file.  Social History Social History   Tobacco Use  . Smoking  status: Current Every Day Smoker    Packs/day: 1.00  . Smokeless tobacco: Never Used  Substance Use Topics  . Alcohol use: No  . Drug use: No    Review of Systems  All other systems negative except as documented in the HPI. All pertinent positives and negatives as reviewed in the HPI. ____________________________________________   PHYSICAL EXAM:  VITAL SIGNS: ED Triage Vitals  Enc Vitals Group     BP 09/12/17 1317 (!) 155/83     Pulse Rate 09/12/17 1317 65     Resp 09/12/17 1317 18     Temp 09/12/17 1317 98 F (36.7 C)     Temp Source 09/12/17 1317 Temporal     SpO2 09/12/17 1317 100 %     Weight --      Height --      Head Circumference --      Peak Flow --      Pain Score 09/12/17 1319 0     Pain Loc --      Pain Edu? --      Excl. in [place]? --     Constitutional: Alert and oriented. Well appearing and in no acute distress. Eyes: Conjunctivae are normal. PERRL. EOMI. Head: Atraumatic. Nose: No congestion/rhinnorhea. Mouth/Throat: Mucous membranes are moist.  Oropharynx non-erythematous. Neck: No stridor.  No meningeal signs.   Cardiovascular: Normal rate, regular rhythm. Good peripheral circulation. Grossly normal heart sounds.   Respiratory: Normal respiratory effort.  No retractions. Lungs CTAB. Gastrointestinal: Soft and nontender. No  distention.  Musculoskeletal: No lower extremity tenderness nor edema. No gross deformities of extremities. Neurologic:  Normal speech and language. No gross focal neurologic deficits are appreciated.  Skin:  Skin is warm, dry and intact. No rash noted.   ____________________________________________   LABS (all labs ordered are listed, but only abnormal results are displayed)  Labs Reviewed  BASIC METABOLIC PANEL - Abnormal; Notable for the following components:      Result Value   Glucose, Bld 108 (*)    All other components within normal limits  URINALYSIS, ROUTINE W REFLEX MICROSCOPIC - Abnormal; Notable for the following  components:   Hgb urine dipstick LARGE (*)    Leukocytes, UA TRACE (*)    Squamous Epithelial / LPF 0-5 (*)    All other components within normal limits  CBG MONITORING, ED - Abnormal; Notable for the following components:   Glucose-Capillary 115 (*)    All other components within normal limits  URINE CULTURE  CBC  TROPONIN I  TSH   ____________________________________________  EKG   EKG Interpretation  Date/Time:  Saturday September 12 2017 13:28:08 EDT Ventricular Rate:  55 PR Interval:    QRS Duration: 92 QT Interval:  453 QTC Calculation: 434 R Axis:   64 Text Interpretation:  Sinus rhythm Atrial premature complex No significant change since last tracing Confirmed by Merrily Pew [phone]) on 09/12/2017 1:32:55 PM       ____________________________________________  RADIOLOGY  Ct Renal Stone Study  Result Date: 09/12/2017 CLINICAL DATA:  Hematuria, dizziness and lightheadedness. History of rheumatoid arthritis. EXAM: CT ABDOMEN AND PELVIS WITHOUT CONTRAST TECHNIQUE: Multidetector CT imaging of the abdomen and pelvis was performed following the standard protocol without IV contrast. COMPARISON:  None. FINDINGS: LOWER CHEST: Dependent atelectasis. The visualized heart size is normal. No pericardial effusion. HEPATOBILIARY: Normal. PANCREAS: Normal. SPLEEN: Spleen is top-normal in size. ADRENALS/URINARY TRACT: Kidneys are orthotopic, demonstrating normal size and morphology. Too small to characterize hypodensity upper pole LEFT kidney. No nephrolithiasis, hydronephrosis; limited assessment for renal masses on this nonenhanced examination. The unopacified ureters are normal in course and caliber. Urinary bladder is partially distended and unremarkable. Normal adrenal glands. STOMACH/BOWEL: Very small hiatal hernia. The stomach, small and large bowel are normal in course and caliber without inflammatory changes, sensitivity decreased by lack of enteric contrast. Mild amount of retained  large bowel stool. Normal appendix. VASCULAR/LYMPHATIC: Aortoiliac vessels are normal in course and caliber. Moderate calcific atherosclerosis. No lymphadenopathy by CT size criteria. REPRODUCTIVE: Normal. OTHER: No intraperitoneal free fluid or free air. MUSCULOSKELETAL: Non-acute. Osteopenia. Levoscoliosis. Grade 1 L2-3 retrolisthesis, severe spondylosis without spondylolysis. Severe T11-12 degenerative disc. Subcentimeter probable bone island LEFT posterior twelfth rib. Small fat containing umbilical hernia. IMPRESSION: 1. No nephrolithiasis, hydronephrosis nor acute intra-abdominal/pelvic process. 2. Borderline splenomegaly. Aortic Atherosclerosis (ICD10-I70.0). Electronically Signed   By: Elon Alas M.D.   On: 09/12/2017 17:12    ____________________________________________   PROCEDURES  Procedure(s) performed:   Procedures   ____________________________________________   INITIAL IMPRESSION / ASSESSMENT AND PLAN / ED COURSE  Possibly UTI. Possibly anxiety, but overall unclear cause for symptoms. Low likelihood of cardiac orn eurologic causes. Seems infectious. Has stuffy nose and congestion, could be viral infection?  Symptoms seem improved with Ativan at this time so it is possible that some anxiety.  She had large amount of blood in her urine so CT scan done to evaluate for bladder, renal or ureteral issues and this was unremarkable besides a small hypodense lesion in her kidney.  She  will continue follow with her primary doctor to find the cause of her hematuria. No emergent need for further workup at this time.   Pertinent labs & imaging results that were available during my care of the patient were reviewed by me and considered in my medical decision making (see chart for details).  ____________________________________________  FINAL CLINICAL IMPRESSION(S) / ED DIAGNOSES  Final diagnoses:  Lightheadedness  Hematuria, unspecified type  Kidney lesion     MEDICATIONS  GIVEN DURING THIS VISIT:  Medications  LORazepam (ATIVAN) tablet 0.5 mg (0.5 mg Oral Given 09/12/17 1513)     NEW OUTPATIENT MEDICATIONS STARTED DURING THIS VISIT:  New Prescriptions   LORAZEPAM (ATIVAN) 1 MG TABLET    Take 0.5 tablets (0.5 mg total) by mouth every 6 (six) hours as needed for anxiety.    Note:  This note was prepared with assistance of Dragon voice recognition software. Occasional wrong-word or sound-a-like substitutions may have occurred due to the inherent limitations of voice recognition software.   Merrily Pew, MD 09/12/17 [phone]

## 2017-09-12 NOTE — ED Triage Notes (Signed)
Patient complains of dizziness and lightheadedness. Patient states she feels anxious. States same symptoms as last seen 3 days ago.

## 2017-09-12 NOTE — ED Notes (Signed)
Pt in restroom giving urine sample.  

## 2017-09-12 NOTE — ED Notes (Signed)
Have given pt's blood to phlebotomy

## 2017-09-12 NOTE — ED Notes (Signed)
Pt is also stating she has no appetite

## 2017-09-12 NOTE — ED Notes (Signed)
Pt back from CT

## 2017-09-12 NOTE — ED Notes (Addendum)
Pt refused CT scan notified MD

## 2017-09-13 LAB — URINE CULTURE: CULTURE: NO GROWTH

## 2017-09-15 ENCOUNTER — Ambulatory Visit (INDEPENDENT_AMBULATORY_CARE_PROVIDER_SITE_OTHER): Payer: BC Managed Care – PPO | Admitting: Family Medicine

## 2017-09-15 ENCOUNTER — Encounter: Payer: Self-pay | Admitting: Family Medicine

## 2017-09-15 VITALS — BP 136/78 | Ht 67.0 in | Wt 142.6 lb

## 2017-09-15 DIAGNOSIS — J310 Chronic rhinitis: Secondary | ICD-10-CM

## 2017-09-15 DIAGNOSIS — M19071 Primary osteoarthritis, right ankle and foot: Secondary | ICD-10-CM | POA: Diagnosis not present

## 2017-09-15 DIAGNOSIS — R3129 Other microscopic hematuria: Secondary | ICD-10-CM

## 2017-09-15 DIAGNOSIS — R5382 Chronic fatigue, unspecified: Secondary | ICD-10-CM

## 2017-09-15 DIAGNOSIS — M19072 Primary osteoarthritis, left ankle and foot: Secondary | ICD-10-CM

## 2017-09-15 DIAGNOSIS — R21 Rash and other nonspecific skin eruption: Secondary | ICD-10-CM | POA: Diagnosis not present

## 2017-09-15 DIAGNOSIS — M0579 Rheumatoid arthritis with rheumatoid factor of multiple sites without organ or systems involvement: Secondary | ICD-10-CM

## 2017-09-15 MED ORDER — AMOXICILLIN-POT CLAVULANATE 875-125 MG PO TABS: 1.0000 | ORAL_TABLET | Freq: Two times a day (BID) | ORAL | 0 refills | Status: AC

## 2017-09-15 NOTE — Progress Notes (Signed)
   Subjective:    Patient ID: SIENNA STONEHOCKER, female    DOB: Nov 29, 1955, 62 y.o.   MRN: [id number] Patient lives with complicated list of concerns and multiple recent ER visits HPI  Patient arrives for follow up from recent ER visits on 09/09/17 and 09/12/17  Last tue did not feel good  Has had an inflammed area lat side of left knee uncomfortable and painful swells sometimes  Seen in the er had dvt work up  Last wk felt achey  Also felt dizzy and felt  Shaky with possible chills  Recent cong and dranage and frontal pressure  Dim energy and dragging lately, pos chills but no fever  Pt fel really bad with all this last wk and so went to the ER times two   everntually went tot the ER  Had sig work up   In fact seen twice in the ER  All notes and tests and eval eval in front of the pt today   Ended up going to the ER twice, in the interim felt bad,  EMS came out, blood pressure was up, ended going bk to the ER. Had neg EKGs and neg troponins,  Had urine cx and negative  Pt has been on humira now for several yrs.  Has seen dernatologist for scaly rash on the back    B was significntly     Review of Systems No headache, no major weight loss or weight gain, no chest pain no back pain abdominal pain no change in bowel habits complete ROS otherwise negative     Objective:   Physical Exam Alert and oriented, vitals reviewed and stable, NAD ENT-TM's and ext canals positive frontal congestion stuffiness chronic inflammation nasal mucosa WNL bilat via otoscopic exam Soft palate, tonsils and post pharynx WNL via oropharyngeal exam Neck-symmetric, no masses; thyroid nonpalpable and nontender Pulmonary-no tachypnea or accessory muscle use; Clear without wheezes via auscultation Card--no abnrml murmurs, rhythm reg and rate WNL Carotid pulses symmetric, without bruits Left lateral knee slight tenderness inflammation to palpation low back positive scaly patchy rash       Assessment & Plan:  Impression 2 visits for multitude of concerns to the emergency room with him past week.  All notes examined.  All lab results and imaging exam discussed at great length with patient.  Several concerns emerge shortness including;  2.  Microscopic hematuria.  Present x2.  Discussed at length.  Negative CT scan needs urology referral  3.  Left lateral knee pain.  Negative DVT work-up likely musculoskeletal discussed  4.  Puzzling, followed by dermatologist.  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  Palpated by smoking.  I prescribed ENT referral  6.  Fatigue multifactorial discussed  7.  Patient wonders about serious internal infection no evidence at this time exam or blood work or frequent history discussed  Greater than 50% of this 40 minute face to face visit was spent in counseling and discussion and coordination of care regarding the above diagnosis/diagnosies

## 2017-09-15 NOTE — Patient Instructions (Signed)
We will work on your referrals

## 2017-09-16 ENCOUNTER — Other Ambulatory Visit: Payer: Self-pay | Admitting: Rheumatology

## 2017-09-16 NOTE — Telephone Encounter (Signed)
Last Visit: 08/18/16 Next Visit: 09/17/17 Labs: 09/09/17 WNL TB Gold: 08/25/17 Neg  Okay to refill per Dr. Estanislado Pandy

## 2017-09-17 ENCOUNTER — Ambulatory Visit: Payer: BC Managed Care – PPO | Admitting: Physician Assistant

## 2017-09-17 ENCOUNTER — Encounter: Payer: Self-pay | Admitting: Physician Assistant

## 2017-09-17 VITALS — BP 170/82 | HR 70 | Resp 16 | Ht 67.0 in | Wt 145.0 lb

## 2017-09-17 DIAGNOSIS — Z8709 Personal history of other diseases of the respiratory system: Secondary | ICD-10-CM | POA: Diagnosis not present

## 2017-09-17 DIAGNOSIS — M818 Other osteoporosis without current pathological fracture: Secondary | ICD-10-CM

## 2017-09-17 DIAGNOSIS — M19072 Primary osteoarthritis, left ankle and foot: Secondary | ICD-10-CM

## 2017-09-17 DIAGNOSIS — M19071 Primary osteoarthritis, right ankle and foot: Secondary | ICD-10-CM

## 2017-09-17 DIAGNOSIS — M19042 Primary osteoarthritis, left hand: Secondary | ICD-10-CM | POA: Diagnosis not present

## 2017-09-17 DIAGNOSIS — M19041 Primary osteoarthritis, right hand: Secondary | ICD-10-CM | POA: Diagnosis not present

## 2017-09-17 DIAGNOSIS — M0579 Rheumatoid arthritis with rheumatoid factor of multiple sites without organ or systems involvement: Secondary | ICD-10-CM | POA: Diagnosis not present

## 2017-09-17 DIAGNOSIS — Z79899 Other long term (current) drug therapy: Secondary | ICD-10-CM

## 2017-09-17 NOTE — Patient Instructions (Signed)
Standing Labs We placed an order today for your standing lab work.    Please come back and get your standing labs in July and every 3 months   We have open lab Monday through Friday from 8:30-11:30 AM and 1:30-4:00 PM  at the office of Dr. Shaili Deveshwar.   You may experience shorter wait times on Monday and Friday afternoons. The office is located at [address], [address], [place], [place] [address] No appointment is necessary.   Labs are drawn by [facility].  You may receive a bill from [facility] for your lab work. If you have any questions regarding directions or hours of operation,  please call [phone].    

## 2017-09-24 ENCOUNTER — Encounter: Payer: Self-pay | Admitting: Family Medicine

## 2017-11-04 ENCOUNTER — Encounter: Payer: Self-pay | Admitting: Family Medicine

## 2017-11-04 ENCOUNTER — Ambulatory Visit (INDEPENDENT_AMBULATORY_CARE_PROVIDER_SITE_OTHER): Payer: BC Managed Care – PPO | Admitting: Family Medicine

## 2017-11-04 VITALS — BP 144/82 | HR 59 | Wt 144.2 lb

## 2017-11-04 DIAGNOSIS — J329 Chronic sinusitis, unspecified: Secondary | ICD-10-CM | POA: Diagnosis not present

## 2017-11-04 MED ORDER — PREDNISONE 20 MG PO TABS: ORAL_TABLET | ORAL | 0 refills | Status: DC

## 2017-11-04 MED ORDER — AMOXICILLIN-POT CLAVULANATE 875-125 MG PO TABS: ORAL_TABLET | ORAL | 0 refills | Status: DC

## 2017-11-04 NOTE — Progress Notes (Signed)
   Subjective:    Patient ID: Dawn Meyer, female    DOB: [birth year], 61 y.o.   MRN: [id number]  Sinusitis  This is a new problem. The current episode started 1 to 4 weeks ago. The problem has been gradually worsening since onset. There has been no fever. Associated symptoms include sinus pressure and sneezing. Past treatments include oral decongestants and spray decongestants. The treatment provided mild relief.   Using flonase  Used some aftrin   Runny nose altwenating with cong and topped up  Uses afrin prn   In and of the cold and heat  Some exposure on Sunday to soneone sik     Not into chest   Used afrin the last thre or four night s  Some gunky  Sometimes clear and disch , nore gunk in the morn      Review of Systems  HENT: Positive for sinus pressure and sneezing.        Objective:   Physical Exam  Alert, mild malaise. Hydration good Vitals stable. frontal/ maxillary tenderness evident positive nasal congestion. pharynx normal neck supple  lungs clear/no crackles or wheezes. heart regular in rhythm       Assessment & Plan:  Impression rhinosinusitis likely post viral, discussed with patient. plan antibiotics prescribed. Questions answered. Symptomatic care discussed. warning signs discussed. WSL Complicated by chronic smoking/chronic rhinitis/recent use of Afrin.  Will add steroid taper.  Patient has ENT visit due soon.  Offered Atrovent patient wishes to wait till

## 2017-11-17 DIAGNOSIS — F17200 Nicotine dependence, unspecified, uncomplicated: Secondary | ICD-10-CM | POA: Insufficient documentation

## 2017-11-17 DIAGNOSIS — Z720 Tobacco use: Secondary | ICD-10-CM | POA: Insufficient documentation

## 2017-11-17 DIAGNOSIS — J342 Deviated nasal septum: Secondary | ICD-10-CM | POA: Insufficient documentation

## 2017-11-17 DIAGNOSIS — J309 Allergic rhinitis, unspecified: Secondary | ICD-10-CM | POA: Insufficient documentation

## 2017-11-17 DIAGNOSIS — J329 Chronic sinusitis, unspecified: Secondary | ICD-10-CM | POA: Insufficient documentation

## 2017-11-17 DIAGNOSIS — F17210 Nicotine dependence, cigarettes, uncomplicated: Secondary | ICD-10-CM | POA: Insufficient documentation

## 2017-12-11 ENCOUNTER — Other Ambulatory Visit: Payer: Self-pay | Admitting: Rheumatology

## 2017-12-11 NOTE — Telephone Encounter (Signed)
Last visit: 09/17/17 Next Visit: 02/17/18 Labs: 09/12/17 WNL TB Gold: 08/25/17 Neg   Okay to refill per Dr. Estanislado Pandy

## 2018-02-01 ENCOUNTER — Encounter: Payer: Self-pay | Admitting: Family Medicine

## 2018-02-01 ENCOUNTER — Ambulatory Visit (INDEPENDENT_AMBULATORY_CARE_PROVIDER_SITE_OTHER): Payer: BC Managed Care – PPO | Admitting: Family Medicine

## 2018-02-01 VITALS — BP 132/80 | HR 68 | Temp 98.3°F | Ht 67.0 in | Wt 146.0 lb

## 2018-02-01 DIAGNOSIS — M0579 Rheumatoid arthritis with rheumatoid factor of multiple sites without organ or systems involvement: Secondary | ICD-10-CM

## 2018-02-01 DIAGNOSIS — R062 Wheezing: Secondary | ICD-10-CM

## 2018-02-01 DIAGNOSIS — J4520 Mild intermittent asthma, uncomplicated: Secondary | ICD-10-CM | POA: Diagnosis not present

## 2018-02-01 MED ORDER — ALBUTEROL SULFATE HFA 108 (90 BASE) MCG/ACT IN AERS: INHALATION_SPRAY | RESPIRATORY_TRACT | 5 refills | Status: DC

## 2018-02-01 MED ORDER — PREDNISONE 20 MG PO TABS: ORAL_TABLET | ORAL | 0 refills | Status: DC

## 2018-02-01 NOTE — Progress Notes (Signed)
   Subjective:    Patient ID: Dawn Meyer, female    DOB: 06-16-1955, 62 y.o.   MRN: [id number]  Cough  This is a new problem. Episode onset: 3 days. Associated symptoms include shortness of breath and wheezing. Pertinent negatives include no chest pain, ear pain, fever or rhinorrhea. Associated symptoms comments: Tightness in chest, sob at night. Treatments tried: inhaler.  She is on Humira currently for rheumatoid Diagnosed with scabies last week. Pt will finish treatment this week.  Patient with significant coughing congestion also some wheezing and shortness of breath.  It wakes her up in the evening time.  She has used a breathing treatment it seems to help to some degree PMH benign She denies any fevers Review of Systems  Constitutional: Negative for activity change and fever.  HENT: Negative for congestion, ear pain and rhinorrhea.   Eyes: Negative for discharge.  Respiratory: Positive for cough, shortness of breath and wheezing.   Cardiovascular: Negative for chest pain.       Objective:   Physical Exam  Constitutional: She appears well-nourished. No distress.  HENT:  Head: Normocephalic and atraumatic.  Eyes: Right eye exhibits no discharge. Left eye exhibits no discharge.  Neck: No tracheal deviation present.  Cardiovascular: Normal rate, regular rhythm and normal heart sounds.  No murmur heard. Pulmonary/Chest: Effort normal and breath sounds normal. No respiratory distress.  Musculoskeletal: She exhibits no edema.  Lymphadenopathy:    She has no cervical adenopathy.  Neurological: She is alert. Coordination normal.  Skin: Skin is warm and dry.  Psychiatric: She has a normal mood and affect. Her behavior is normal.  Vitals reviewed.   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

## 2018-02-03 NOTE — Progress Notes (Signed)
Office Visit Note  Patient: Dawn Meyer             Date of Birth: 06/27/1955           MRN: [id number]             PCP: Mikey Kirschner, MD Referring: Mikey Kirschner, MD Visit Date: 02/17/2018 Occupation: @GUAROCC @  Subjective:  Medication monitoring   History of Present Illness: Dawn Meyer is a 62 y.o. female with history of seropositive rheumatoid arthritis and osteoarthritis.  She is on Humira 40 mg sq once every 3 weeks due to recurrent sinusitis.  She denies any recent rheumatoid arthritis flares.  She denies any increased joint pain or joint swelling while spacing her Humira dosing up.  She states that the beginning September she was diagnosed with scabies and was treated at that time.  She states that she recently had an asthma exacerbation and was started on a round of prednisone and then recently was started on Augmentin.  She has a 10-day course of Augmentin.  She has been off of Humira for 4 to 5 weeks.  She has not had any increased joint pain, joint swelling, or joint stiffness.  She continues to have tenderness in her right second MCP joint which is chronic.  She does not feel that her arthritis keeps her from doing anything that she wants to do.    Activities of Daily Living:  Patient reports morning stiffness for 0 minutes.   Patient Denies nocturnal pain.  Difficulty dressing/grooming: Denies Difficulty climbing stairs: Denies Difficulty getting out of chair: Denies Difficulty using hands for taps, buttons, cutlery, and/or writing: Denies  Review of Systems  Constitutional: Negative for fatigue.  HENT: Negative for mouth sores, trouble swallowing, trouble swallowing, mouth dryness and nose dryness.   Eyes: Negative for pain, visual disturbance and dryness.  Respiratory: Negative for cough, hemoptysis, shortness of breath and difficulty breathing.   Cardiovascular: Negative for chest pain, palpitations, hypertension and swelling in legs/feet.    Gastrointestinal: Negative for abdominal pain, blood in stool, constipation, diarrhea, nausea and vomiting.  Endocrine: Negative for increased urination.  Genitourinary: Negative for painful urination, nocturia and pelvic pain.  Musculoskeletal: Positive for arthralgias and joint pain. Negative for joint swelling, myalgias, muscle weakness, morning stiffness, muscle tenderness and myalgias.  Skin: Positive for rash. Negative for color change, pallor, hair loss, nodules/bumps, skin tightness, ulcers and sensitivity to sunlight.  Allergic/Immunologic: Negative for susceptible to infections.  Neurological: Negative for dizziness, light-headedness, numbness, headaches, memory loss and weakness.  Hematological: Negative for swollen glands.  Psychiatric/Behavioral: Negative for depressed mood, confusion and sleep disturbance. The patient is not nervous/anxious.     PMFS History:  Patient Active Problem List   Diagnosis Date Noted  . Rheumatoid arthritis involving multiple sites with positive rheumatoid factor ([place]) 08/18/2016  . Swollen wrist, left 08/18/2016  . 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 Primary osteoarthritis of both feet 08/18/2016  . OP (osteoporosis) 08/18/2016  . Degenerative joint disease involving multiple joints 08/18/2016  . High risk medication use 08/18/2016    Past Medical History:  Diagnosis Date  . Asthma   . RA (rheumatoid arthritis) (HCC)     Family History  Problem Relation Age of Onset  . Congestive Heart Failure Mother   . Heart attack Mother   . Diabetes Father   . Heart Problems Father        pacemaker  . Diverticulitis Sister   . Hypertension  Sister   . Heart attack Brother   . Healthy Daughter    Past Surgical History:  Procedure Laterality Date  . VAGINAL DELIVERY     Social History   Social History Narrative  . Not on file    Objective: Vital Signs: BP (!) 160/75 (BP Location: Left Arm, Patient Position: Sitting, Cuff  Size: Normal)   Pulse 66   Resp 15   Ht 5' 7.5" (1.715 m)   Wt 148 lb 9.6 oz (67.4 kg)   BMI 22.93 kg/m    Physical Exam  Constitutional: She is oriented to person, place, and time. She appears well-developed and well-nourished.  HENT:  Head: Normocephalic and atraumatic.  Eyes: Conjunctivae and EOM are normal.  Neck: Normal range of motion.  Cardiovascular: Normal rate, regular rhythm, normal heart sounds and intact distal pulses.  Pulmonary/Chest: Effort normal and breath sounds normal.  Abdominal: Soft. Bowel sounds are normal.  Lymphadenopathy:    She has no cervical adenopathy.  Neurological: She is alert and oriented to person, place, and time.  Skin: Skin is warm and dry. Capillary refill takes less than 2 seconds.  Psychiatric: She has a normal mood and affect. Her behavior is normal.  Nursing note and vitals reviewed.    Musculoskeletal Exam: C-spine, thoracic spine, lumbar spine good range of motion.  No midline spinal tenderness.  No SI joint tenderness.  Shoulder joints, elbow joints, wrist joints, MCPs, PIPs, DIPs good range of motion no synovitis.  She has right second and third MCP joint synovial thickening.  She has tenderness in her right second MCP joint.  Hip joints, knee joints, ankle joints, MTPs, PIPs, DIPs good range of motion no synovitis.  No warmth or effusion bilateral knee joints.  No tenderness of trochanter bursa bilaterally.  No tenderness or swelling of bilateral ankle joints.  No Achilles tendinitis or plantar fasciitis noted.  CDAI Exam: CDAI Score: 1.4  Patient Global Assessment: 2 (mm); Provider Global Assessment: 2 (mm) Swollen: 0 ; Tender: 1  Joint Exam      Right  Left  MCP 2   Tender        Investigation: No additional findings.  Imaging: No results found.  Recent Labs: Lab Results  Component Value Date   WBC 8.2 09/12/2017   HGB 14.0 09/12/2017   PLT 187 09/12/2017   NA 145 09/12/2017   K 3.9 09/12/2017   CL 110 09/12/2017     CO2 24 09/12/2017   GLUCOSE 108 (H) 09/12/2017   BUN 16 09/12/2017   CREATININE 0.89 09/12/2017   BILITOT 0.7 09/09/2017   ALKPHOS 102 09/09/2017   AST 15 09/09/2017   ALT 16 09/09/2017   PROT 7.1 09/09/2017   ALBUMIN 3.6 09/09/2017   CALCIUM 9.6 09/12/2017   GFRAA >60 09/12/2017   QFTBGOLD Negative 07/29/2016   QFTBGOLDPLUS Negative 08/25/2017    Speciality Comments: No specialty comments available.  Procedures:  No procedures performed Allergies: Doxycycline   Assessment / Plan:     Visit Diagnoses: Rheumatoid arthritis involving multiple sites with positive rheumatoid factor ([place]): She has no synovitis on exam today.  She has not had any recent rheumatoid arthritis flares.  She has synovial thickening and tenderness of her right second MCP joint which is chronic.  She is clinically doing well on Humira 40 mg subcutaneous injections every 21 days.  She has been off of Humira for 4-5 weeks due to being treated for scabies followed by an asthma exacerbation and is currently  on Augmentin.  She was advised to hold Humira while she is on Augmentin.  She will resume Humira 40 mg subcu days injections every 21 days.  Refill sent today.  She is advised to notify us if she develops increased joint pain or joint swelling.  She will follow-up in the office in 5 months.  High risk medication use - Humira Pen-40mg /0.4ML every other week. TB gold negative 08/25/17.  CBC and CMP will be drawn today to monitor for drug toxicity.  She will return in December and every 3 months for lab work.- Plan: COMPLETE METABOLIC PANEL WITH GFR, CBC with Differential/Platelet  Primary osteoarthritis of both hands: She has PIP and DIP synovial thickening consistent with osteoarthritis of bilateral hands.  She has complete fist formation bilaterally.  No synovitis was noted.  Joint protection and muscle strengthening were discussed.  Primary osteoarthritis of both feet: She has mild osteoarthritic changes in  bilateral feet.  She has no discomfort in her feet at this time.  She was proper fitting shoes.  Other osteoporosis without current pathological fracture  History of asthma: She is currently being treated with Augmentin 10-day course for an asthma exacerbation.  Orders: Orders Placed This Encounter  Procedures  . COMPLETE METABOLIC PANEL WITH GFR  . CBC with Differential/Platelet   Meds ordered this encounter  Medications  . Adalimumab (HUMIRA PEN) 40 MG/0.4ML PNKT    Sig: Inject 40 mg into the skin every 21 ( twenty-one) days.    Dispense:  2 each    Refill:  0    2 kits=4 pens    Face-to-face time spent with patient was 30 minutes. Greater than 50% of time was spent in counseling and coordination of care.  Follow-Up Instructions: Return in about 5 months (around 07/20/2018) for Rheumatoid arthritis, Osteoarthritis.   Ofilia Neas, PA-C   I examined and evaluated the patient with Hazel Sams PA. The plan of care was discussed as noted above.  Bo Merino, MD  Note - This record has been created using Editor, commissioning.  Chart creation errors have been sought, but may not always  have been located. Such creation errors do not reflect on  the standard of medical care.

## 2018-02-12 ENCOUNTER — Ambulatory Visit (INDEPENDENT_AMBULATORY_CARE_PROVIDER_SITE_OTHER): Payer: BC Managed Care – PPO | Admitting: Family Medicine

## 2018-02-12 ENCOUNTER — Encounter: Payer: Self-pay | Admitting: Family Medicine

## 2018-02-12 VITALS — BP 122/76 | HR 73 | Temp 98.2°F | Ht 67.0 in | Wt 146.2 lb

## 2018-02-12 DIAGNOSIS — J329 Chronic sinusitis, unspecified: Secondary | ICD-10-CM

## 2018-02-12 DIAGNOSIS — J4520 Mild intermittent asthma, uncomplicated: Secondary | ICD-10-CM

## 2018-02-12 DIAGNOSIS — J310 Chronic rhinitis: Secondary | ICD-10-CM

## 2018-02-12 MED ORDER — AMOXICILLIN-POT CLAVULANATE 875-125 MG PO TABS: ORAL_TABLET | ORAL | 0 refills | Status: DC

## 2018-02-12 MED ORDER — FLUTICASONE PROPIONATE HFA 44 MCG/ACT IN AERO: INHALATION_SPRAY | RESPIRATORY_TRACT | 0 refills | Status: DC

## 2018-02-12 NOTE — Progress Notes (Signed)
   Subjective:    Patient ID: Dawn Meyer, female    DOB: 1956-05-14, 62 y.o.   MRN: [id number]  Shortness of Breath  This is a new problem. The current episode started 1 to 4 weeks ago. The problem occurs intermittently. Associated symptoms comments: Feels like something in back of throat. Treatments tried: Dr.Scott prescribed Prednisone last week; has used inhaler. The treatment provided moderate relief.  Pt states she feels better today than she has been. Pt was prescribed Ativan by ER doctor in April and pt states that she took a half one last night and it helped her get sleep.  Persistent cough and cong since last visit  Sensation of something in the throat  Used ativan last night and it hleped sleep   Having tightness intermitentlu  And wheeing intermittently   Notes sob at times  Pt has been trying to back off on the smoking   Has backed off fom 15 cigarettes downto 8 or 10      Review of Systems  Respiratory: Positive for shortness of breath.        Objective:   Physical Exam  Alert, mild malaise. Hydration good Vitals stable. frontal/ maxillary tenderness evident positive nasal congestion. pharynx normal neck supple  lungs clear/no crackles with deep breaths slight wheezes. heart regular in rhythm       Assessment & Plan:  Impression rhinosinusitis likely post viral, discussed with patient. plan antibiotics prescribed. Questions answered. Symptomatic care discussed. warning signs discussed. WSL Will cover with another round of antibiotics, also add Flovent twice daily rationale discussed  Patient encouraged to stop smoking  Offered full pulmonary work-up for potential COPD patient declines for now

## 2018-02-17 ENCOUNTER — Encounter: Payer: Self-pay | Admitting: Physician Assistant

## 2018-02-17 ENCOUNTER — Ambulatory Visit: Payer: BC Managed Care – PPO | Admitting: Physician Assistant

## 2018-02-17 VITALS — BP 160/75 | HR 66 | Resp 15 | Ht 67.5 in | Wt 148.6 lb

## 2018-02-17 DIAGNOSIS — M19071 Primary osteoarthritis, right ankle and foot: Secondary | ICD-10-CM | POA: Diagnosis not present

## 2018-02-17 DIAGNOSIS — M19072 Primary osteoarthritis, left ankle and foot: Secondary | ICD-10-CM

## 2018-02-17 DIAGNOSIS — Z79899 Other long term (current) drug therapy: Secondary | ICD-10-CM

## 2018-02-17 DIAGNOSIS — M19041 Primary osteoarthritis, right hand: Secondary | ICD-10-CM | POA: Diagnosis not present

## 2018-02-17 DIAGNOSIS — M0579 Rheumatoid arthritis with rheumatoid factor of multiple sites without organ or systems involvement: Secondary | ICD-10-CM | POA: Diagnosis not present

## 2018-02-17 DIAGNOSIS — M818 Other osteoporosis without current pathological fracture: Secondary | ICD-10-CM

## 2018-02-17 DIAGNOSIS — M19042 Primary osteoarthritis, left hand: Secondary | ICD-10-CM

## 2018-02-17 DIAGNOSIS — Z8709 Personal history of other diseases of the respiratory system: Secondary | ICD-10-CM

## 2018-02-17 MED ORDER — ADALIMUMAB 40 MG/0.4ML ~~LOC~~ AJKT: 40.0000 mg | AUTO-INJECTOR | SUBCUTANEOUS | 0 refills | Status: DC

## 2018-02-17 NOTE — Patient Instructions (Signed)
Standing Labs We placed an order today for your standing lab work.    Please come back and get your standing labs in December and every 3 months   We have open lab Monday through Friday from 8:30-11:30 AM and 1:30-4:00 PM  at the office of Dr. Shaili Deveshwar.   You may experience shorter wait times on Monday and Friday afternoons. The office is located at [address], [address], [place], [place] [address] No appointment is necessary.   Labs are drawn by [facility].  You may receive a bill from [facility] for your lab work. If you have any questions regarding directions or hours of operation,  please call [phone].     

## 2018-02-18 LAB — CBC WITH DIFFERENTIAL/PLATELET
BASOS ABS: 61 {cells}/uL (ref 0–200)
Basophils Relative: 0.6 %
EOS PCT: 4.9 %
Eosinophils Absolute: 495 cells/uL (ref 15–500)
HCT: 38.7 % (ref 35.0–45.0)
Hemoglobin: 13 g/dL (ref 11.7–15.5)
Lymphs Abs: 4000 cells/uL — ABNORMAL HIGH (ref 850–3900)
MCH: 30.7 pg (ref 27.0–33.0)
MCHC: 33.6 g/dL (ref 32.0–36.0)
MCV: 91.3 fL (ref 80.0–100.0)
MPV: 10.3 fL (ref 7.5–12.5)
Monocytes Relative: 7.6 %
NEUTROS ABS: 4777 {cells}/uL (ref 1500–7800)
NEUTROS PCT: 47.3 %
Platelets: 191 10*3/uL (ref 140–400)
RBC: 4.24 10*6/uL (ref 3.80–5.10)
RDW: 12.7 % (ref 11.0–15.0)
Total Lymphocyte: 39.6 %
WBC mixed population: 768 cells/uL (ref 200–950)
WBC: 10.1 10*3/uL (ref 3.8–10.8)

## 2018-02-18 LAB — COMPLETE METABOLIC PANEL WITH GFR
AG RATIO: 1.6 (calc) (ref 1.0–2.5)
ALKALINE PHOSPHATASE (APISO): 85 U/L (ref 33–130)
ALT: 12 U/L (ref 6–29)
AST: 11 U/L (ref 10–35)
Albumin: 3.9 g/dL (ref 3.6–5.1)
BUN: 14 mg/dL (ref 7–25)
CO2: 27 mmol/L (ref 20–32)
Calcium: 8.8 mg/dL (ref 8.6–10.4)
Chloride: 107 mmol/L (ref 98–110)
Creat: 0.98 mg/dL (ref 0.50–0.99)
GFR, Est African American: 73 mL/min/{1.73_m2} (ref >=60)
GFR, Est Non African American: 63 mL/min/{1.73_m2} (ref >=60)
Globulin: 2.4 g/dL (calc) (ref 1.9–3.7)
Glucose, Bld: 74 mg/dL (ref 65–99)
POTASSIUM: 3.6 mmol/L (ref 3.5–5.3)
SODIUM: 140 mmol/L (ref 135–146)
Total Bilirubin: 0.5 mg/dL (ref 0.2–1.2)
Total Protein: 6.3 g/dL (ref 6.1–8.1)

## 2018-02-18 NOTE — Progress Notes (Signed)
Labs are WNL.

## 2018-02-26 ENCOUNTER — Telehealth: Payer: Self-pay | Admitting: Rheumatology

## 2018-02-26 NOTE — Telephone Encounter (Signed)
Copy of labs mailed to patient.

## 2018-02-26 NOTE — Telephone Encounter (Signed)
Patient called requesting her labwork results be mailed to her:  [address], [place], [place]  [address]

## 2018-04-01 ENCOUNTER — Telehealth: Payer: Self-pay | Admitting: Family Medicine

## 2018-04-01 NOTE — Telephone Encounter (Signed)
correct 

## 2018-04-01 NOTE — Telephone Encounter (Signed)
Pt will need to disc with [place] why she needs we've never rxed and I try not to get folks on these long term unless really need

## 2018-04-01 NOTE — Telephone Encounter (Signed)
Wireless customer not available

## 2018-04-01 NOTE — Telephone Encounter (Signed)
Pt requesting a refill for LORazepam (ATIVAN) 1 MG tablet, last prescribed by ER provider [place], [place] seen: 02/12/18

## 2018-04-01 NOTE — Telephone Encounter (Signed)
Patient had wanted Dr Richardson Landry to take over the refills of the medication for anxiety given to her in the ER. Advised patient that Dr Richardson Landry would need to see her in the office to have our office to start to prescribe and maintain the medication long term and she said thank you and hung up with out scheduling appointment.

## 2018-04-09 ENCOUNTER — Other Ambulatory Visit: Payer: Self-pay | Admitting: Physician Assistant

## 2018-04-09 NOTE — Telephone Encounter (Signed)
Last Visit: 02/17/18 Next Visit: 07/21/18 Labs: 02/17/18 WNL TB Gold: 08/25/17 Neg   Okay to refill per Dr. Estanislado Pandy

## 2018-05-03 ENCOUNTER — Telehealth: Payer: Self-pay | Admitting: *Deleted

## 2018-05-03 ENCOUNTER — Telehealth: Payer: Self-pay | Admitting: Pharmacy Technician

## 2018-05-03 MED ORDER — ADALIMUMAB 40 MG/0.4ML ~~LOC~~ AJKT: 40.0000 mg | AUTO-INJECTOR | SUBCUTANEOUS | 0 refills | Status: DC

## 2018-05-03 NOTE — Telephone Encounter (Signed)
Received a Prior Authorization request from [employer] for Humira. Authorization has been submitted to patient's insurance via Cover My Meds. Will update once we receive a response.  9:21 AM Beatriz Chancellor, CPhT

## 2018-05-03 NOTE — Telephone Encounter (Signed)
Will you please see if a prior authorization is due for Humira. Thanks!

## 2018-05-03 NOTE — Telephone Encounter (Signed)
Last Visit: 02/17/18 Next Visit: 07/21/18  Labs: 02/17/18 WNL TB Gold: 08/25/17 Neg   Okay to refill per Dr. Estanislado Pandy  Patient will come by the office for a sample today.

## 2018-05-03 NOTE — Telephone Encounter (Signed)
Received a fax from [place] regarding a prior authorization for Humira. Authorization has been APPROVED from 05/03/18 to 05/03/20.   Will send document to scan center.  Authorization # [id number]  Phone # [phone]  3:23 PM Beatriz Chancellor, CPhT

## 2018-05-03 NOTE — Telephone Encounter (Signed)
PA request sent in to patient's plan

## 2018-06-07 ENCOUNTER — Encounter: Payer: Self-pay | Admitting: Family Medicine

## 2018-06-07 ENCOUNTER — Ambulatory Visit: Payer: BC Managed Care – PPO | Admitting: Family Medicine

## 2018-06-07 VITALS — BP 132/86 | Temp 98.4°F | Wt 149.6 lb

## 2018-06-07 DIAGNOSIS — J4520 Mild intermittent asthma, uncomplicated: Secondary | ICD-10-CM | POA: Diagnosis not present

## 2018-06-07 DIAGNOSIS — J329 Chronic sinusitis, unspecified: Secondary | ICD-10-CM

## 2018-06-07 DIAGNOSIS — J310 Chronic rhinitis: Secondary | ICD-10-CM

## 2018-06-07 MED ORDER — PREDNISONE 20 MG PO TABS: ORAL_TABLET | ORAL | 0 refills | Status: DC

## 2018-06-07 MED ORDER — AMOXICILLIN-POT CLAVULANATE 875-125 MG PO TABS: ORAL_TABLET | ORAL | 0 refills | Status: DC

## 2018-06-07 NOTE — Progress Notes (Signed)
   Subjective:    Patient ID: Dawn Meyer, female    DOB: [birth year], 63 y.o.   MRN: [id number]  Cough  This is a new problem. The current episode started in the past 7 days. The cough is productive of sputum. Associated symptoms include nasal congestion and rhinorrhea. Treatments tried: inhaler; cough pearls. The treatment provided mild relief.   Started around three to four days ago  Pos cough and cong  Cough productive  Using albuterol only and not the flovent at this time   Moved into the chest quickly  Cough was really bad  Some incr in the wheezing not partic bad   Using the inhaler ore these days to se if keeps her open, and sems to hav e worked  flovent seemed to [employer] u the taste buds nd nake them not as  sensti Review of Systems  HENT: Positive for rhinorrhea.   Respiratory: Positive for cough.        Objective:   Physical Exam  Alert, mild malaise. Hydration good Vitals stable.  evident positive nasal congestion. pharynx normal neck supple  lungs clear/no crackles however positive wheezes. heart regular in rhythm       Assessment & Plan:  Impression rhinosinusitis/bronchitis with exacerbation of reactive airways.  Likely post viral, discussed with patient. plan antibiotics prescribed. Questions answered. Symptomatic care discussed. warning signs discussed. WSL Also will do prednisone taper

## 2018-06-21 ENCOUNTER — Telehealth: Payer: Self-pay | Admitting: Rheumatology

## 2018-06-21 ENCOUNTER — Other Ambulatory Visit: Payer: Self-pay | Admitting: *Deleted

## 2018-06-21 DIAGNOSIS — Z79899 Other long term (current) drug therapy: Secondary | ICD-10-CM

## 2018-06-21 NOTE — Telephone Encounter (Signed)
Lab orders released and faxed.  

## 2018-06-21 NOTE — Telephone Encounter (Signed)
[facility] called requesting labwork orders for patient faxed to [phone].  Patient is currently at their office on [facility].

## 2018-06-22 LAB — CMP14+EGFR
ALK PHOS: 96 IU/L (ref 39–117)
ALT: 10 IU/L (ref 0–32)
AST: 8 IU/L (ref 0–40)
Albumin/Globulin Ratio: 1.8 (ref 1.2–2.2)
Albumin: 3.8 g/dL (ref 3.8–4.9)
BILIRUBIN TOTAL: 0.4 mg/dL (ref 0.0–1.2)
BUN / CREAT RATIO: 15 (ref 12–28)
BUN: 14 mg/dL (ref 8–27)
CO2: 22 mmol/L (ref 20–29)
CREATININE: 0.96 mg/dL (ref 0.57–1.00)
Calcium: 8.7 mg/dL (ref 8.7–10.3)
Chloride: 106 mmol/L (ref 96–106)
GFR calc Af Amer: 74 mL/min/{1.73_m2} (ref >59)
GFR calc non Af Amer: 64 mL/min/{1.73_m2} (ref >59)
GLUCOSE: 75 mg/dL (ref 65–99)
Globulin, Total: 2.1 g/dL (ref 1.5–4.5)
Potassium: 4.4 mmol/L (ref 3.5–5.2)
Sodium: 142 mmol/L (ref 134–144)
Total Protein: 5.9 g/dL — ABNORMAL LOW (ref 6.0–8.5)

## 2018-06-22 LAB — CBC WITH DIFFERENTIAL/PLATELET
BASOS ABS: 0 10*3/uL (ref 0.0–0.2)
Basos: 0 %
EOS (ABSOLUTE): 0.5 10*3/uL — AB (ref 0.0–0.4)
Eos: 5 %
HEMOGLOBIN: 12.6 g/dL (ref 11.1–15.9)
Hematocrit: 36.5 % (ref 34.0–46.6)
Immature Grans (Abs): 0 10*3/uL (ref 0.0–0.1)
Immature Granulocytes: 0 %
LYMPHS ABS: 4.2 10*3/uL — AB (ref 0.7–3.1)
Lymphs: 45 %
MCH: 30.7 pg (ref 26.6–33.0)
MCHC: 34.5 g/dL (ref 31.5–35.7)
MCV: 89 fL (ref 79–97)
MONOCYTES: 6 %
Monocytes Absolute: 0.6 10*3/uL (ref 0.1–0.9)
NEUTROS ABS: 4.2 10*3/uL (ref 1.4–7.0)
Neutrophils: 44 %
PLATELETS: 186 10*3/uL (ref 150–450)
RBC: 4.1 x10E6/uL (ref 3.77–5.28)
RDW: 12.8 % (ref 11.7–15.4)
WBC: 9.6 10*3/uL (ref 3.4–10.8)

## 2018-07-07 NOTE — Progress Notes (Deleted)
Office Visit Note  Patient: Dawn Meyer             Date of Birth: 1956/01/28           MRN: [id number]             PCP: Mikey Kirschner, MD Referring: Mikey Kirschner, MD Visit Date: 07/21/2018 Occupation: @GUAROCC @  Subjective:  No chief complaint on file.  Humira 40 mg every 21 days.  Last TB gold negative on 08/25/2017 and will monitor yearly.  Future order for TB Gold placed.  Most recent CBC/CMP stable on 06/21/2018 and will monitor every 3 months.  Standing order is are in place.  History of Present Illness: Dawn Meyer is a 63 y.o. female ***   Activities of Daily Living:  Patient reports morning stiffness for *** {minute/hour:19697}.   Patient {ACTIONS;DENIES/REPORTS:21021675::"Denies"} nocturnal pain.  Difficulty dressing/grooming: {ACTIONS;DENIES/REPORTS:21021675::"Denies"} Difficulty climbing stairs: {ACTIONS;DENIES/REPORTS:21021675::"Denies"} Difficulty getting out of chair: {ACTIONS;DENIES/REPORTS:21021675::"Denies"} Difficulty using hands for taps, buttons, cutlery, and/or writing: {ACTIONS;DENIES/REPORTS:21021675::"Denies"}  No Rheumatology ROS completed.   PMFS History:  Patient Active Problem List   Diagnosis Date Noted  . Rheumatoid arthritis involving multiple sites with positive rheumatoid factor ([place]) 08/18/2016  . Swollen wrist, left 08/18/2016  . 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 Primary osteoarthritis of both feet 08/18/2016  . OP (osteoporosis) 08/18/2016  . Degenerative joint disease involving multiple joints 08/18/2016  . High risk medication use 08/18/2016    Past Medical History:  Diagnosis Date  . Asthma   . RA (rheumatoid arthritis) (HCC)     Family History  Problem Relation Age of Onset  . Congestive Heart Failure Mother   . Heart attack Mother   . Diabetes Father   . Heart Problems Father        pacemaker  . Diverticulitis Sister   . Hypertension Sister   . Heart attack Brother   . Healthy  Daughter    Past Surgical History:  Procedure Laterality Date  . VAGINAL DELIVERY     Social History   Social History Narrative  . Not on file   Immunization History  Administered Date(s) Administered  . Influenza-Unspecified 03/20/2016, 03/11/2018  . Td 08/28/2009     Objective: Vital Signs: There were no vitals taken for this visit.   Physical Exam   Musculoskeletal Exam: ***  CDAI Exam: CDAI Score: Not documented Patient Global Assessment: Not documented; Provider Global Assessment: Not documented Swollen: Not documented; Tender: Not documented Joint Exam   Not documented   There is currently no information documented on the homunculus. Go to the Rheumatology activity and complete the homunculus joint exam.  Investigation: No additional findings.  Imaging: No results found.  Recent Labs: Lab Results  Component Value Date   WBC 9.6 06/21/2018   HGB 12.6 06/21/2018   PLT 186 06/21/2018   NA 142 06/21/2018   K 4.4 06/21/2018   CL 106 06/21/2018   CO2 22 06/21/2018   GLUCOSE 75 06/21/2018   BUN 14 06/21/2018   CREATININE 0.96 06/21/2018   BILITOT 0.4 06/21/2018   ALKPHOS 96 06/21/2018   AST 8 06/21/2018   ALT 10 06/21/2018   PROT 5.9 (L) 06/21/2018   ALBUMIN 3.8 06/21/2018   CALCIUM 8.7 06/21/2018   GFRAA 74 06/21/2018   QFTBGOLD Negative 07/29/2016   QFTBGOLDPLUS Negative 08/25/2017    Speciality Comments: No specialty comments available.  Procedures:  No procedures performed Allergies: Doxycycline   Assessment / Plan:  Visit Diagnoses: Rheumatoid arthritis involving multiple sites with positive rheumatoid factor (HCC)  High risk medication use - Humira Pen-40mg /0.4ML every other week. TB gold negative 08/25/17.    Primary osteoarthritis of both hands  Primary osteoarthritis of both feet  Other osteoporosis without current pathological fracture  History of asthma   Orders: No orders of the defined types were placed in this  encounter.  No orders of the defined types were placed in this encounter.   Face-to-face time spent with patient was *** minutes. Greater than 50% of time was spent in counseling and coordination of care.  Follow-Up Instructions: No follow-ups on file.   Dawn Neas, PA-C  Note - This record has been created using Dragon software.  Chart creation errors have been sought, but may not always  have been located. Such creation errors do not reflect on  the standard of medical care.

## 2018-07-12 ENCOUNTER — Ambulatory Visit (INDEPENDENT_AMBULATORY_CARE_PROVIDER_SITE_OTHER): Payer: BC Managed Care – PPO | Admitting: Family Medicine

## 2018-07-12 ENCOUNTER — Encounter: Payer: Self-pay | Admitting: Family Medicine

## 2018-07-12 VITALS — BP 122/70 | Temp 98.6°F | Ht 67.5 in | Wt 150.8 lb

## 2018-07-12 DIAGNOSIS — J329 Chronic sinusitis, unspecified: Secondary | ICD-10-CM

## 2018-07-12 MED ORDER — LEVOFLOXACIN 500 MG PO TABS: 500.0000 mg | ORAL_TABLET | Freq: Every day | ORAL | 0 refills | Status: AC

## 2018-07-12 NOTE — Progress Notes (Signed)
   Subjective:    Patient ID: Dawn Meyer, female    DOB: [birth year], 63 y.o.   MRN: [id number]  Sinusitis  This is a new problem. The current episode started more than 1 month ago. Associated symptoms include coughing and a sore throat. Treatments tried: augmentin, prednisone.   Pt has ha persistnt cough and ocl and cong  Took pred nd augmentin    Pt took  pred and aug , dfinitely helped   loosend up the chest and spitting stuff up then   This tie not so bad in the chest   More in the back of the nose, and throat, and sore in the morn    Review of Systems  HENT: Positive for sore throat.   Respiratory: Positive for cough.        Objective:   Physical Exam  Alert, mild malaise. Hydration good Vitals stable. frontal/ maxillary tenderness evident positive nasal congestion. pharynx normal neck supple  lungs clear/no crackles or wheezes. heart regular in rhythm       Assessment & Plan:  Impression rhinosinusitis likely post viral, discussed with patient. plan antibiotics prescribed. Questions answered. Symptomatic care discussed. warning signs discussed. WSL Persistent in nature encouraged to stop smoking

## 2018-07-20 NOTE — Progress Notes (Deleted)
Office Visit Note  Patient: Dawn Meyer             Date of Birth: [birth year]           MRN: [id number]             PCP: Mikey Kirschner, MD Referring: Mikey Kirschner, MD Visit Date: 07/28/2018 Occupation: @GUAROCC @  Subjective:  No chief complaint on file.   History of Present Illness: Dawn Meyer is a 63 y.o. female ***   Activities of Daily Living:  Patient reports morning stiffness for *** {minute/hour:19697}.   Patient {ACTIONS;DENIES/REPORTS:21021675::"Denies"} nocturnal pain.  Difficulty dressing/grooming: {ACTIONS;DENIES/REPORTS:21021675::"Denies"} Difficulty climbing stairs: {ACTIONS;DENIES/REPORTS:21021675::"Denies"} Difficulty getting out of chair: {ACTIONS;DENIES/REPORTS:21021675::"Denies"} Difficulty using hands for taps, buttons, cutlery, and/or writing: {ACTIONS;DENIES/REPORTS:21021675::"Denies"}  No Rheumatology ROS completed.   PMFS History:  Patient Active Problem List   Diagnosis Date Noted  . Rheumatoid arthritis involving multiple sites with positive rheumatoid factor ([place]) 08/18/2016  . Swollen wrist, left 08/18/2016  . 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 Primary osteoarthritis of both feet 08/18/2016  . OP (osteoporosis) 08/18/2016  . Degenerative joint disease involving multiple joints 08/18/2016  . High risk medication use 08/18/2016    Past Medical History:  Diagnosis Date  . Asthma   . RA (rheumatoid arthritis) (HCC)     Family History  Problem Relation Age of Onset  . Congestive Heart Failure Mother   . Heart attack Mother   . Diabetes Father   . Heart Problems Father        pacemaker  . Diverticulitis Sister   . Hypertension Sister   . Heart attack Brother   . Healthy Daughter    Past Surgical History:  Procedure Laterality Date  . VAGINAL DELIVERY     Social History   Social History Narrative  . Not on file   Immunization History  Administered Date(s) Administered  .  Influenza-Unspecified 03/20/2016, 03/11/2018  . Td 08/28/2009     Objective: Vital Signs: There were no vitals taken for this visit.   Physical Exam   Musculoskeletal Exam: ***  CDAI Exam: CDAI Score: Not documented Patient Global Assessment: Not documented; Provider Global Assessment: Not documented Swollen: Not documented; Tender: Not documented Joint Exam   Not documented   There is currently no information documented on the homunculus. Go to the Rheumatology activity and complete the homunculus joint exam.  Investigation: No additional findings.  Imaging: No results found.  Recent Labs: Lab Results  Component Value Date   WBC 9.6 06/21/2018   HGB 12.6 06/21/2018   PLT 186 06/21/2018   NA 142 06/21/2018   K 4.4 06/21/2018   CL 106 06/21/2018   CO2 22 06/21/2018   GLUCOSE 75 06/21/2018   BUN 14 06/21/2018   CREATININE 0.96 06/21/2018   BILITOT 0.4 06/21/2018   ALKPHOS 96 06/21/2018   AST 8 06/21/2018   ALT 10 06/21/2018   PROT 5.9 (L) 06/21/2018   ALBUMIN 3.8 06/21/2018   CALCIUM 8.7 06/21/2018   GFRAA 74 06/21/2018   QFTBGOLD Negative 07/29/2016   QFTBGOLDPLUS Negative 08/25/2017    Speciality Comments: No specialty comments available.  Procedures:  No procedures performed Allergies: Doxycycline   Assessment / Plan:     Visit Diagnoses: Rheumatoid arthritis involving multiple sites with positive rheumatoid factor (HCC)  High risk medication use - Humira 40 mg subcutaneous injections every 21 days.   Primary osteoarthritis of both hands  Primary osteoarthritis of both feet  Other osteoporosis without  current pathological fracture  History of asthma   Orders: No orders of the defined types were placed in this encounter.  No orders of the defined types were placed in this encounter.   Face-to-face time spent with patient was *** minutes. Greater than 50% of time was spent in counseling and coordination of care.  Follow-Up Instructions: No  follow-ups on file.   Ofilia Neas, PA-C  Note - This record has been created using Dragon software.  Chart creation errors have been sought, but may not always  have been located. Such creation errors do not reflect on  the standard of medical care.

## 2018-07-21 ENCOUNTER — Ambulatory Visit: Payer: BC Managed Care – PPO | Admitting: Physician Assistant

## 2018-07-28 ENCOUNTER — Ambulatory Visit: Payer: BC Managed Care – PPO | Admitting: Physician Assistant

## 2018-08-30 NOTE — Progress Notes (Deleted)
Virtual Visit via Telephone Note  I connected with Dawn Meyer on 08/30/18 at 10:40 AM EDT by telephone and verified that I am speaking with the correct person using two identifiers.   I discussed the limitations, risks, security and privacy concerns of performing an evaluation and management service by telephone and the availability of in person appointments. I also discussed with the patient that there may be a patient responsible charge related to this service. The patient expressed understanding and agreed to proceed.  CC: History of Present Illness: Patient is a 63 year old female with a past medical history of seropositive rheumatoid arthritis and osteoarthritis.  She is on Humira 40 mg sq injections every 21 days.   Review of Systems  Constitutional: Negative for fever and malaise/fatigue.  Eyes: Negative for photophobia, pain, discharge and redness.  Respiratory: Negative for cough, shortness of breath and wheezing.   Cardiovascular: Negative for chest pain and palpitations.  Gastrointestinal: Negative for blood in stool, constipation and diarrhea.  Genitourinary: Negative for dysuria.  Musculoskeletal: Negative for back pain, joint pain, myalgias and neck pain.  Skin: Negative for rash.  Neurological: Negative for dizziness and headaches.  Psychiatric/Behavioral: Negative for depression. The patient is not nervous/anxious and does not have insomnia.      Observations/Objective: Physical Exam  Constitutional: She is oriented to person, place, and time.  Neurological: She is alert and oriented to person, place, and time.  Psychiatric: Mood, memory, affect and judgment normal.   Patient reports morning stiffness for  {minute/hour:19697}.   Patient {Actions; denies-reports:120008} nocturnal pain.  Difficulty dressing/grooming: {ACTIONS;DENIES/REPORTS:21021675::"Denies"} Difficulty climbing stairs: {ACTIONS;DENIES/REPORTS:21021675::"Denies"} Difficulty getting out of  chair: {ACTIONS;DENIES/REPORTS:21021675::"Denies"} Difficulty using hands for taps, buttons, cutlery, and/or writing: {ACTIONS;DENIES/REPORTS:21021675::"Denies"} Assessment and Plan: Rheumatoid arthritis involving multiple sites with positive rheumatoid factor ([place]):   High risk medication use - Humira Pen-40mg /0.4ML every 21 days. TB gold negative 08/25/17.    Primary osteoarthritis of both hands:   Primary osteoarthritis of both feet:   Other osteoporosis without current pathological fracture  History of asthma:   Follow Up Instructions:    I discussed the assessment and treatment plan with the patient. The patient was provided an opportunity to ask questions and all were answered. The patient agreed with the plan and demonstrated an understanding of the instructions.   The patient was advised to call back or seek an in-person evaluation if the symptoms worsen or if the condition fails to improve as anticipated.  I provided *** minutes of non-face-to-face time during this encounter.   Ofilia Neas, PA-C

## 2018-09-01 NOTE — Progress Notes (Signed)
Virtual Visit via Telephone Note  I connected with Dawn Meyer on 09/01/18 at 11:45 AM EDT by telephone and verified that I am speaking with the correct person using two identifiers.   I discussed the limitations, risks, security and privacy concerns of performing an evaluation and management service by telephone and the availability of in person appointments. I also discussed with the patient that there may be a patient responsible charge related to this service. The patient expressed understanding and agreed to proceed.  CC: Medication monitoring   History of Present Illness: Patient is a 63 year old female with a past medical history of seropositive rheumatoid arthritis and osteoarthritis.  She is on Humira 40 mg sq injections.  Her last injection was 3.5 weeks ago. She was on an antibiotic prescribed by her dermatologist for a skin infection on her back.  She had a biopsy.  She states the rash blisters. She is planning on resuming Humira this weekend.   She denies any rheumatoid arthritis flares.  She denies any joint pain or joint swelling.  She has been seeing a podiatrist for a callus on her right foot.    Review of Systems  Constitutional: Negative for fever and malaise/fatigue.  Eyes: Negative for photophobia, pain, discharge and redness.  Respiratory: Negative for cough, shortness of breath and wheezing.   Cardiovascular: Negative for chest pain and palpitations.  Gastrointestinal: Negative for blood in stool, constipation and diarrhea.  Genitourinary: Negative for dysuria.  Musculoskeletal: Negative for back pain, joint pain, myalgias and neck pain.  Skin: Positive for rash.  Neurological: Negative for dizziness and headaches.  Psychiatric/Behavioral: Negative for depression. The patient is not nervous/anxious and does not have insomnia.     Observations/Objective: Physical Exam  Constitutional: She is oriented to person, place, and time.  Neurological: She is alert and  oriented to person, place, and time.  Psychiatric: Mood, memory, affect and judgment normal.   Patient reports morning stiffness for 0  minutes.   Patient denies nocturnal pain.  Difficulty dressing/grooming: Denies Difficulty climbing stairs: Denies Difficulty getting out of chair: Denies Difficulty using hands for taps, buttons, cutlery, and/or writing: Denies   Assessment and Plan: Rheumatoid arthritis involving multiple sites with positive rheumatoid factor ([place]): She has not had any recent rheumatoid arthritis flares. She has no joint pain or joint swelling at this time. She has no morning stiffness.  She is clinically doing well on Humira 40 mg sq injections.  Her last injection was 3.5 weeks ago due to being on antibiotics prescribed by her dermatologist for an infection on her back.  She has completed the antibiotics and will resume Humira injections this week.  She will inject Humira 40 mg sq every 3 weeks. She does not need any refills at this time.  She was advised to notify us if she develops increased joint pain or joint swelling.  She will follow up in 3-4 months.  High risk medication use - Humira Pen-40mg /0.4ML every 3 weeks. TB gold negative 08/25/17.  CBC and CMP were drawn on 06/21/18.  She is due for updated lab work. Future orders for TB gold, CBC, and CMP were placed today.  She was advised to hold Humira if she develops an infection.   Primary osteoarthritis of both hands: She has no joint pain or joint swelling.  She has no difficulty with ADLs.  Primary osteoarthritis of both feet: She has mild osteoarthritic changes in bilateral feet.  she has no discomfort at this time.  She has a callus on her right foot, and  she has been seeing a Hydrographic surveyor.   Follow Up Instructions: She will follow up in 3-4 months. Future orders for CBC, CMP, and TB gold were placed today.    I discussed the assessment and treatment plan with the patient. The patient was provided an  opportunity to ask questions and all were answered. The patient agreed with the plan and demonstrated an understanding of the instructions.   The patient was advised to call back or seek an in-person evaluation if the symptoms worsen or if the condition fails to improve as anticipated.  I provided 25 minutes of non-face-to-face time during this encounter.  Bo Merino, MD   Scribed by- Ofilia Neas, PA-C

## 2018-09-06 ENCOUNTER — Telehealth: Payer: BC Managed Care – PPO | Admitting: Physician Assistant

## 2018-09-08 ENCOUNTER — Other Ambulatory Visit: Payer: Self-pay | Admitting: Rheumatology

## 2018-09-08 DIAGNOSIS — Z79899 Other long term (current) drug therapy: Secondary | ICD-10-CM

## 2018-09-08 NOTE — Telephone Encounter (Signed)
Last Visit: 02/17/2018 Next Visit: 09/10/2018 Labs: 06/21/2018 stable  TB Gold: 08/25/2017 negative   Okay to refill per Dr. Estanislado Pandy.

## 2018-09-10 ENCOUNTER — Telehealth (INDEPENDENT_AMBULATORY_CARE_PROVIDER_SITE_OTHER): Payer: BC Managed Care – PPO | Admitting: Rheumatology

## 2018-09-10 ENCOUNTER — Encounter: Payer: Self-pay | Admitting: Rheumatology

## 2018-09-10 DIAGNOSIS — Z8709 Personal history of other diseases of the respiratory system: Secondary | ICD-10-CM

## 2018-09-10 DIAGNOSIS — M19071 Primary osteoarthritis, right ankle and foot: Secondary | ICD-10-CM

## 2018-09-10 DIAGNOSIS — M818 Other osteoporosis without current pathological fracture: Secondary | ICD-10-CM

## 2018-09-10 DIAGNOSIS — Z79899 Other long term (current) drug therapy: Secondary | ICD-10-CM

## 2018-09-10 DIAGNOSIS — M0579 Rheumatoid arthritis with rheumatoid factor of multiple sites without organ or systems involvement: Secondary | ICD-10-CM | POA: Diagnosis not present

## 2018-09-10 DIAGNOSIS — M19072 Primary osteoarthritis, left ankle and foot: Secondary | ICD-10-CM

## 2018-09-10 DIAGNOSIS — M19041 Primary osteoarthritis, right hand: Secondary | ICD-10-CM

## 2018-09-10 DIAGNOSIS — M19042 Primary osteoarthritis, left hand: Secondary | ICD-10-CM

## 2018-10-11 ENCOUNTER — Telehealth: Payer: Self-pay | Admitting: Rheumatology

## 2018-10-11 DIAGNOSIS — Z79899 Other long term (current) drug therapy: Secondary | ICD-10-CM

## 2018-10-11 NOTE — Telephone Encounter (Signed)
Lab orders released.  

## 2018-10-11 NOTE — Telephone Encounter (Signed)
Patient going to [facility] in [place] for labs tomorrow. Please release orders.

## 2018-10-13 NOTE — Telephone Encounter (Signed)
Alk phos mildly elevated. Will follow. Pl fax results to her PCP.

## 2018-10-14 ENCOUNTER — Other Ambulatory Visit: Payer: Self-pay | Admitting: Family Medicine

## 2018-10-15 LAB — CBC WITH DIFFERENTIAL/PLATELET
Basophils Absolute: 0 10*3/uL (ref 0.0–0.2)
Basos: 1 %
EOS (ABSOLUTE): 0.4 10*3/uL (ref 0.0–0.4)
Eos: 5 %
Hematocrit: 35.9 % (ref 34.0–46.6)
Hemoglobin: 12.8 g/dL (ref 11.1–15.9)
Immature Grans (Abs): 0 10*3/uL (ref 0.0–0.1)
Immature Granulocytes: 0 %
Lymphocytes Absolute: 3.8 10*3/uL — ABNORMAL HIGH (ref 0.7–3.1)
Lymphs: 48 %
MCH: 30.8 pg (ref 26.6–33.0)
MCHC: 35.7 g/dL (ref 31.5–35.7)
MCV: 87 fL (ref 79–97)
Monocytes Absolute: 0.5 10*3/uL (ref 0.1–0.9)
Monocytes: 6 %
Neutrophils Absolute: 3.2 10*3/uL (ref 1.4–7.0)
Neutrophils: 40 %
Platelets: 190 10*3/uL (ref 150–450)
RBC: 4.15 x10E6/uL (ref 3.77–5.28)
RDW: 12.7 % (ref 11.7–15.4)
WBC: 8 10*3/uL (ref 3.4–10.8)

## 2018-10-15 LAB — QUANTIFERON-TB GOLD PLUS
QuantiFERON Mitogen Value: >10 IU/mL
QuantiFERON Nil Value: 0.15 IU/mL
QuantiFERON TB1 Ag Value: 0.15 IU/mL
QuantiFERON TB2 Ag Value: 0.15 IU/mL
QuantiFERON-TB Gold Plus: NEGATIVE

## 2018-10-15 LAB — CMP14+EGFR
ALT: 12 IU/L (ref 0–32)
AST: 14 IU/L (ref 0–40)
Albumin/Globulin Ratio: 2 (ref 1.2–2.2)
Albumin: 4.3 g/dL (ref 3.8–4.9)
Alkaline Phosphatase: 121 IU/L — ABNORMAL HIGH (ref 39–117)
BUN/Creatinine Ratio: 14 (ref 12–28)
BUN: 13 mg/dL (ref 8–27)
Bilirubin Total: 0.3 mg/dL (ref 0.0–1.2)
CO2: 23 mmol/L (ref 20–29)
Calcium: 9.6 mg/dL (ref 8.7–10.3)
Chloride: 108 mmol/L — ABNORMAL HIGH (ref 96–106)
Creatinine, Ser: 0.94 mg/dL (ref 0.57–1.00)
GFR calc Af Amer: 76 mL/min/{1.73_m2} (ref >59)
GFR calc non Af Amer: 66 mL/min/{1.73_m2} (ref >59)
Globulin, Total: 2.2 g/dL (ref 1.5–4.5)
Glucose: 99 mg/dL (ref 65–99)
Potassium: 4.2 mmol/L (ref 3.5–5.2)
Sodium: 146 mmol/L — ABNORMAL HIGH (ref 134–144)
Total Protein: 6.5 g/dL (ref 6.0–8.5)

## 2018-12-10 IMAGING — CT CT RENAL STONE PROTOCOL
2 of 4 series · 16 of 46 positions shown, 18 images · non-contrast
Comparison: None.

CLINICAL DATA: Hematuria, dizziness and lightheadedness. History of
rheumatoid arthritis.

EXAM:
CT ABDOMEN AND PELVIS WITHOUT CONTRAST
TECHNIQUE: Multidetector CT imaging of the abdomen and pelvis was performed
following the standard protocol without IV contrast.

[Series 2: axial st · axial · 0.60mm/px · z∈[+870,+1255]mm · 13 of 85 slices shown, 15 images]
[im 4/85  soft-tissue]
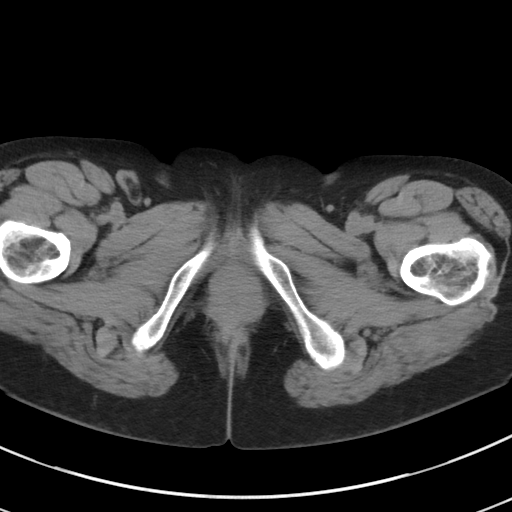
[im 4/85  bone]
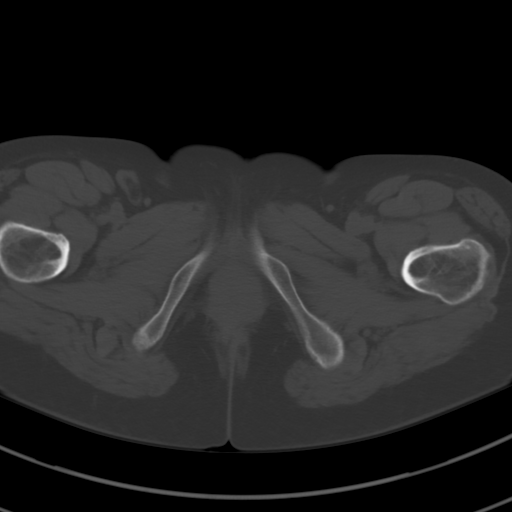
[im 11/85  soft-tissue]
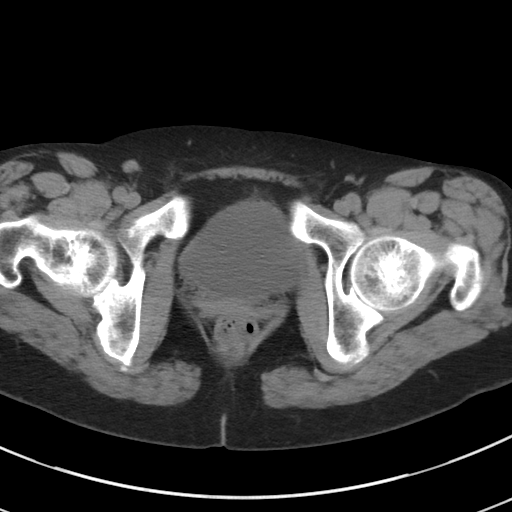
[im 17/85  soft-tissue]
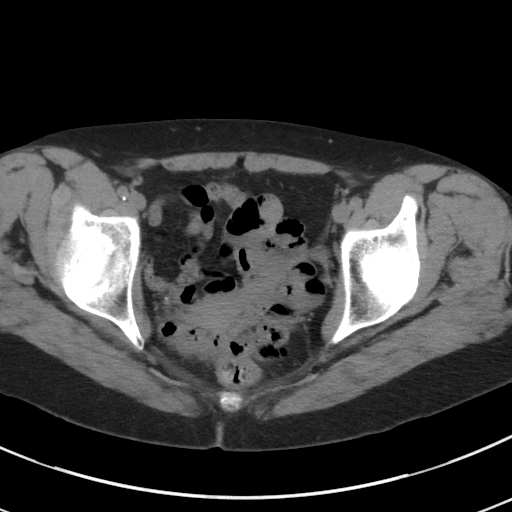
[im 24/85  soft-tissue]
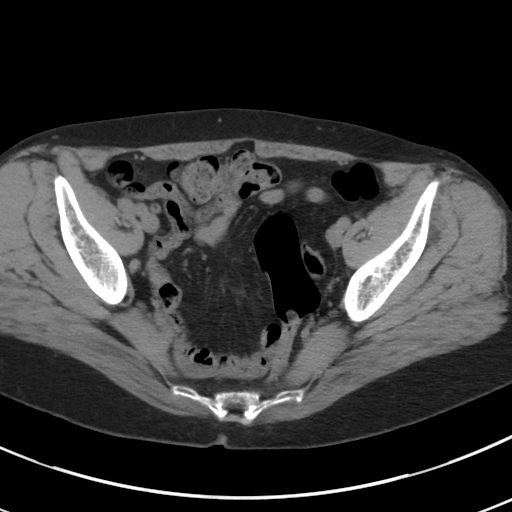
[im 31/85  soft-tissue]
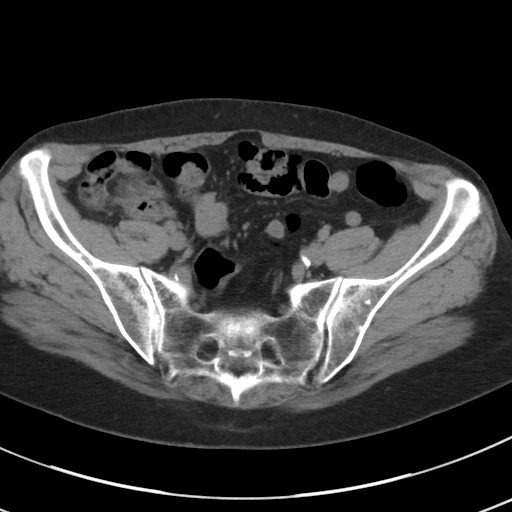
[im 37/85  soft-tissue]
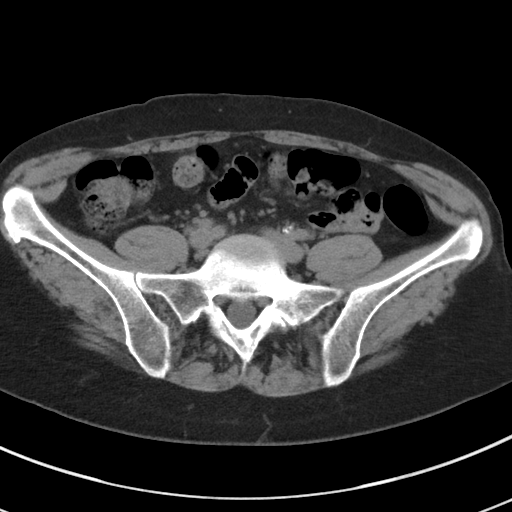
[im 44/85  soft-tissue]
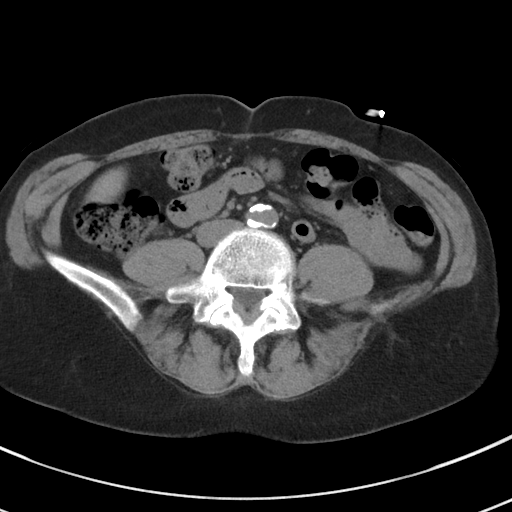
[im 48/85  soft-tissue]
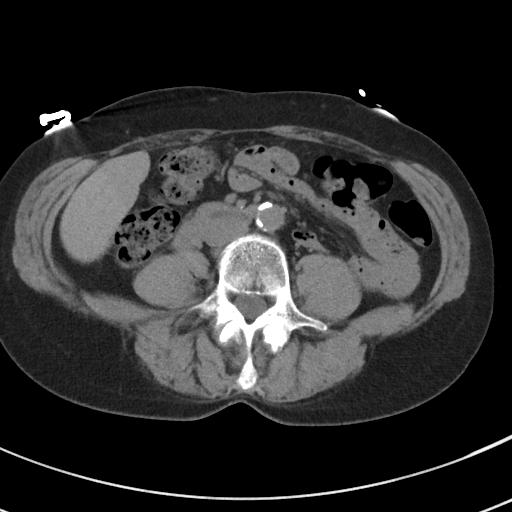
[im 54/85  soft-tissue]
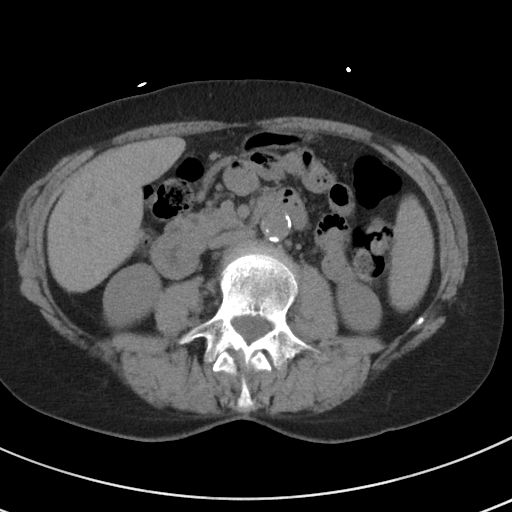
[im 54/85  bone]
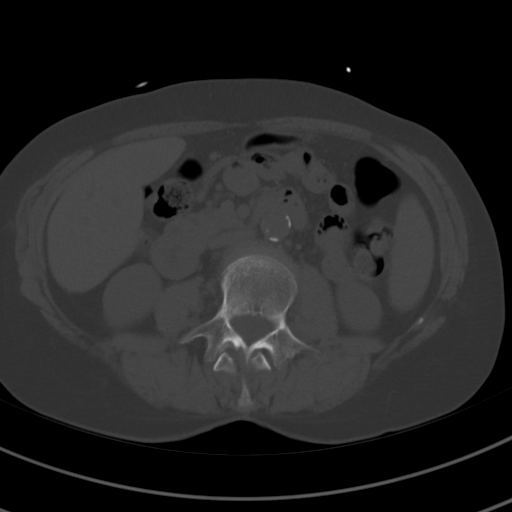
[im 61/85  soft-tissue]
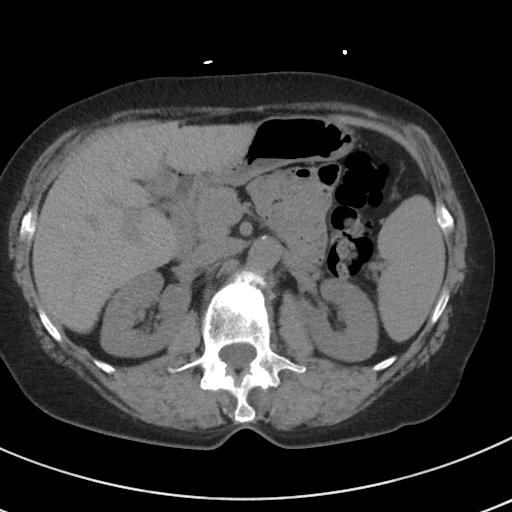
[im 68/85  soft-tissue]
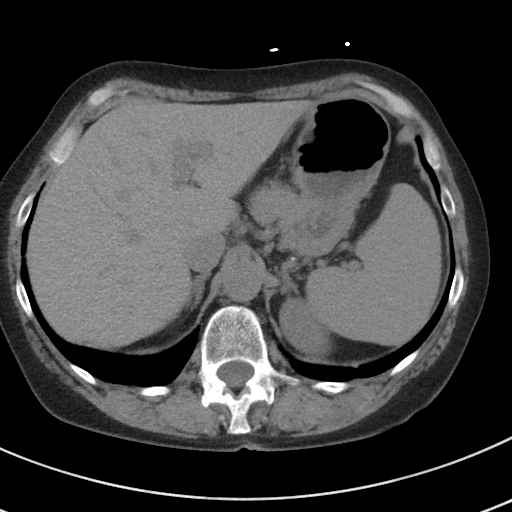
[im 74/85  soft-tissue]
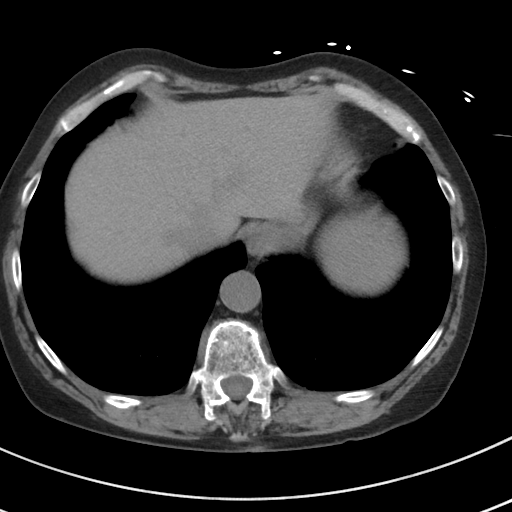
[im 81/85  soft-tissue]
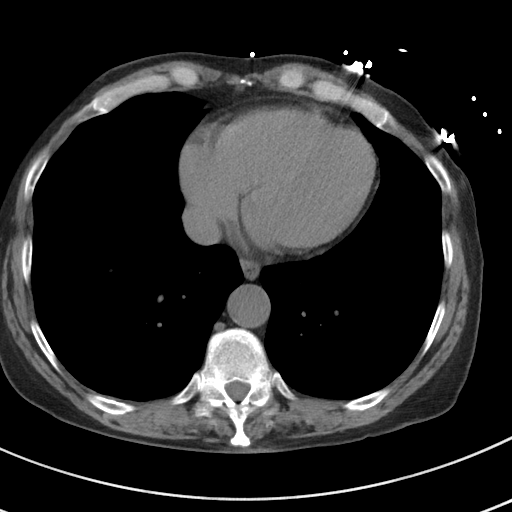

[Series 5: coronal st · coronal · 0.71mm/px · 3 of 88 slices shown]
[im 30/88  soft-tissue]
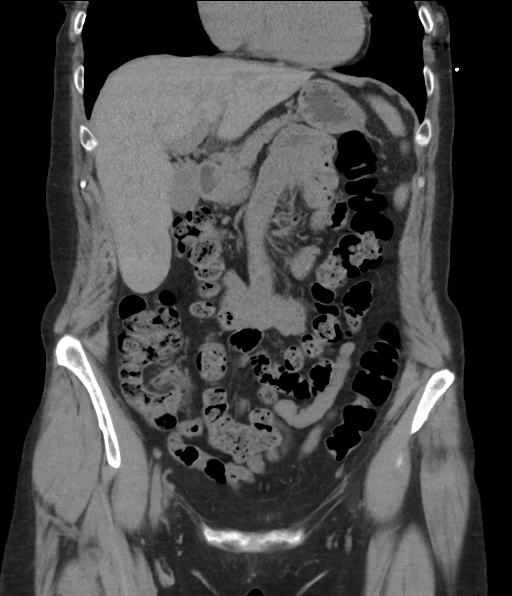
[im 39/88  soft-tissue]
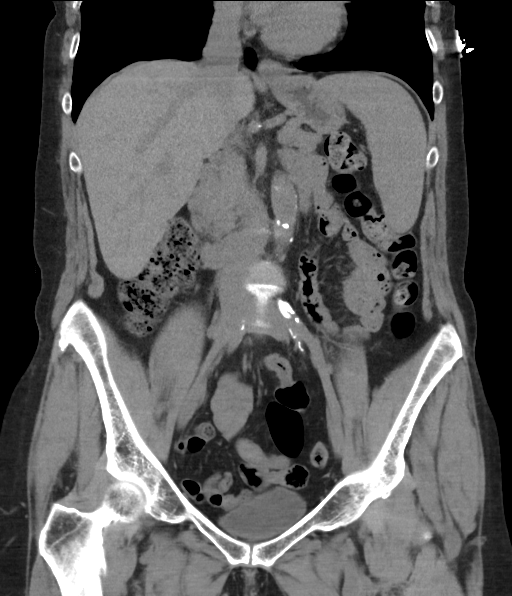
[im 49/88  soft-tissue]
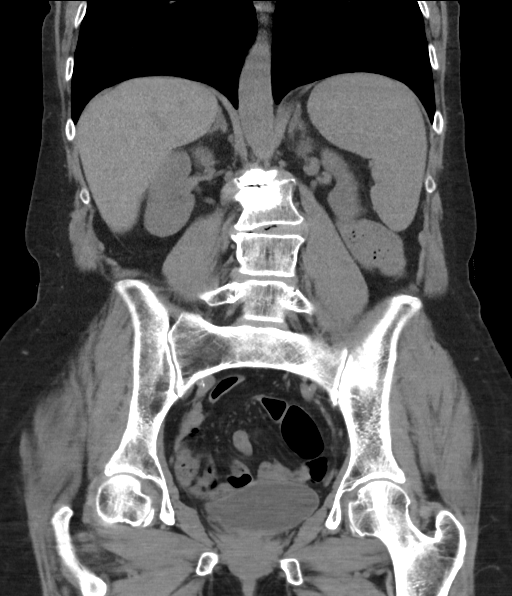

[16 of 46 positions shown; findings below may reference images not displayed]

FINDINGS: LOWER CHEST: Dependent atelectasis. The visualized heart size is
normal. No pericardial effusion.

HEPATOBILIARY: Normal.

PANCREAS: Normal.

SPLEEN: Spleen is top-normal in size.

ADRENALS/URINARY TRACT: Kidneys are orthotopic, demonstrating normal
size and morphology. Too small to characterize hypodensity upper
pole LEFT kidney. No nephrolithiasis, hydronephrosis; limited
assessment for renal masses on this nonenhanced examination. The
unopacified ureters are normal in course and caliber. Urinary
bladder is partially distended and unremarkable. Normal adrenal
glands.

STOMACH/BOWEL: Very small hiatal hernia. The stomach, small and
large bowel are normal in course and caliber without inflammatory
changes, sensitivity decreased by lack of enteric contrast. Mild
amount of retained large bowel stool. Normal appendix.

VASCULAR/LYMPHATIC: Aortoiliac vessels are normal in course and
caliber. Moderate calcific atherosclerosis. No lymphadenopathy by CT
size criteria.

REPRODUCTIVE: Normal.

OTHER: No intraperitoneal free fluid or free air.

MUSCULOSKELETAL: Non-acute. Osteopenia. Levoscoliosis. Grade 1 L2-3
retrolisthesis, severe spondylosis without spondylolysis. Severe
T11-12 degenerative disc. Subcentimeter probable bone island LEFT
posterior twelfth rib. Small fat containing umbilical hernia.
IMPRESSION: 1. No nephrolithiasis, hydronephrosis nor acute
intra-abdominal/pelvic process.
2. Borderline splenomegaly.

Aortic Atherosclerosis (S2O3H-13V.V).

## 2018-12-28 NOTE — Progress Notes (Signed)
Office Visit Note  Patient: Dawn Meyer             Date of Birth: [birth year]           MRN: [id number]             PCP: Mikey Kirschner, MD Referring: Mikey Kirschner, MD Visit Date: 01/11/2019 Occupation: '@GUAROCC'$ @  Subjective:  Rash   History of Present Illness: Dawn Meyer is a 63 y.o. female with history of seropositive rheumatoid arthritis and osteoporosis.  She is on Humira 40 mg every 21 days.  She denies missing any doses. She denies any joint pain or joint swelling. She denies any recent flares.  She has a chronic rash on her back and has seen her dermatologist.  She has tried several topical creams which have not resolved her symptoms. She had a biopsy done and not found to be cancerous.  She describes them as small blisters filled with clear fluid.  They have resolved with use of antibiotics. She is to have another biopsy during a breakout to determine cause.   Activities of Daily Living:  Patient reports morning stiffness for 0 none.   Patient Denies nocturnal pain.  Difficulty dressing/grooming: Denies Difficulty climbing stairs: Denies Difficulty getting out of chair: Denies Difficulty using hands for taps, buttons, cutlery, and/or writing: Denies  Review of Systems  Constitutional: Negative for fatigue.  HENT: Negative for mouth dryness.   Eyes: Negative for dryness.  Respiratory: Negative for shortness of breath.   Cardiovascular: Negative for swelling in legs/feet.  Endocrine: Negative for increased urination.  Genitourinary: Negative for painful urination.  Musculoskeletal: Positive for arthralgias, joint pain and joint swelling.  Skin: Positive for rash.  Allergic/Immunologic: Negative for susceptible to infections.  Neurological: Negative for weakness.  Hematological: Negative for bruising/bleeding tendency.  Psychiatric/Behavioral: Negative for sleep disturbance.    PMFS History:  Patient Active Problem List   Diagnosis Date Noted  .  Rheumatoid arthritis involving multiple sites with positive rheumatoid factor ([place]) 08/18/2016  . Swollen wrist, left 08/18/2016  . 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 Primary osteoarthritis of both feet 08/18/2016  . OP (osteoporosis) 08/18/2016  . Degenerative joint disease involving multiple joints 08/18/2016  . High risk medication use 08/18/2016    Past Medical History:  Diagnosis Date  . Asthma   . RA (rheumatoid arthritis) (HCC)     Family History  Problem Relation Age of Onset  . Congestive Heart Failure Mother   . Heart attack Mother   . Diabetes Father   . Heart Problems Father        pacemaker  . Diverticulitis Sister   . Hypertension Sister   . Heart attack Brother   . Healthy Daughter    Past Surgical History:  Procedure Laterality Date  . MOHS SURGERY    . VAGINAL DELIVERY     Social History   Social History Narrative  . Not on file   Immunization History  Administered Date(s) Administered  . Influenza Inj Mdck Quad Pf 03/12/2017  . Influenza-Unspecified 03/20/2016, 03/11/2018  . Td 08/28/2009     Objective: Vital Signs: BP (!) 138/52 (BP Location: Left Arm, Patient Position: Sitting)   Pulse 66   Resp 16   Ht '5\' 7"'$  (1.702 m)   Wt 149 lb 3.2 oz (67.7 kg)   BMI 23.37 kg/m    Physical Exam Vitals signs and nursing note reviewed.  Constitutional:      Appearance: She  is well-developed.  HENT:     Head: Normocephalic and atraumatic.  Eyes:     Conjunctiva/sclera: Conjunctivae normal.  Neck:     Musculoskeletal: Normal range of motion.  Cardiovascular:     Rate and Rhythm: Normal rate and regular rhythm.     Heart sounds: Normal heart sounds.  Pulmonary:     Effort: Pulmonary effort is normal.     Breath sounds: Normal breath sounds.  Abdominal:     General: Bowel sounds are normal.     Palpations: Abdomen is soft.  Lymphadenopathy:     Cervical: No cervical adenopathy.  Skin:    General: Skin is warm and dry.      Capillary Refill: Capillary refill takes less than 2 seconds.  Neurological:     Mental Status: She is alert and oriented to person, place, and time.  Psychiatric:        Behavior: Behavior normal.      Musculoskeletal Exam: C-spine, thoracic spine, and lumbar spine good ROM.  No midline spinal tenderness.  No SI joint tenderness.  Shoulder joints, elbow joints, wrist joints, MCPs, PIPs, and DIPs good ROM with no synovitis.  Synovial thickening of right 2nd MCP joint.  Hip joints, knee joints, ankle joints, MTPs, PIPs, and DIPs good ROM with no synovitis.  No warmth or effusion of knee joints.  No tenderness or swelling of ankle joints.   CDAI Exam: CDAI Score: - Patient Global: -; Provider Global: - Swollen: -; Tender: - Joint Exam   No joint exam has been documented for this visit   There is currently no information documented on the homunculus. Go to the Rheumatology activity and complete the homunculus joint exam.  Investigation: No additional findings.  Imaging: No results found.  Recent Labs: Lab Results  Component Value Date   WBC 8.0 10/12/2018   HGB 12.8 10/12/2018   PLT 190 10/12/2018   NA 146 (H) 10/12/2018   K 4.2 10/12/2018   CL 108 (H) 10/12/2018   CO2 23 10/12/2018   GLUCOSE 99 10/12/2018   BUN 13 10/12/2018   CREATININE 0.94 10/12/2018   BILITOT 0.3 10/12/2018   ALKPHOS 121 (H) 10/12/2018   AST 14 10/12/2018   ALT 12 10/12/2018   PROT 6.5 10/12/2018   ALBUMIN 4.3 10/12/2018   CALCIUM 9.6 10/12/2018   GFRAA 76 10/12/2018   QFTBGOLD Negative 07/29/2016   QFTBGOLDPLUS Negative 10/12/2018    Speciality Comments: No specialty comments available.  Procedures:  No procedures performed Allergies: Doxycycline   Assessment / Plan:     Visit Diagnoses: Rheumatoid arthritis involving multiple sites with positive rheumatoid factor ([place]) - She has no synovitis on exam.  She has not had any recent rheumatoid arthritis flares.  She is clinically doing well on  Humira 40 mg sq injections every 21 days.  She has not noticed any increased joint pain or joint swelling when spacing humira.  She has no joint pain or joint swelling at this time.  She will continue on the current treatment regimen.  She does not need any refills at this time.  She was advised to notify us if she develops increased joint pain or joint swelling.  She will follow up in 5 months.   High risk medication use - Humira 40 mg every 21 days.  Last TB gold negative on 10/12/2018 and will monitor yearly.  Most recent CBC/CMP within normal limits except for mildly elevated alk phos on 10/12/2018.  Due for CBC/CMP today and  will monitor every 3 months.  Standing orders are in place.  She received the flu vaccine in October.  Her last skin exam was in April 2020.   - Plan: CBC with Differential/Platelet, COMPLETE METABOLIC PANEL WITH GFR  Primary osteoarthritis of both hands -She has no tenderness or inflammation at this time. She has mild PIP and DIP synovial thickening.  Complete fist formation bilaterally.  Joint protection and muscle strengthening were discussed.   Primary osteoarthritis of both feet -She is seeing a podiatrist.  We discussed the importance of wearing proper fitting shoes.   Age-related osteoporosis without current pathological fracture: DEXA 01/19/14 left forearm at the 33% radius site, BMD 0.588 T-score -3.3.  We reviewed DEXA results.  We reviewed the indications, contraindications, and potential side effects of Fosamax.  She states in the past she briefly tried fosamax but could not tolerate it.  She does not want to restart on Fosamax. She also declined reclast IV infusions.  She does not want to proceed with updating DEXA or treatment options at this time.  We explained the risks not being on treatment. We will try readdressing at a future visit.   History of asthma   Orders: Orders Placed This Encounter  Procedures  . CBC with Differential/Platelet  . COMPLETE METABOLIC  PANEL WITH GFR   No orders of the defined types were placed in this encounter.   Face-to-face time spent with patient was 30 minutes. Greater than 50% of time was spent in counseling and coordination of care.  Follow-Up Instructions: Return in about 5 months (around 06/13/2019) for Rheumatoid arthritis, Osteoporosis.   Dawn Neas, PA-C   I examined and evaluated the patient with Dawn Sams PA.  Patient's rheumatoid arthritis is well controlled on Humira.  She had no synovitis on examination today.  We also had detailed discussion regarding osteoporosis.  She does not want to have any treatment and declines to have repeat bone density.  The plan of care was discussed as noted above.  Dawn Merino, MD  Note - This record has been created using Editor, commissioning.  Chart creation errors have been sought, but may not always  have been located. Such creation errors do not reflect on  the standard of medical care.

## 2019-01-11 ENCOUNTER — Other Ambulatory Visit: Payer: Self-pay

## 2019-01-11 ENCOUNTER — Ambulatory Visit: Payer: BC Managed Care – PPO | Admitting: Rheumatology

## 2019-01-11 ENCOUNTER — Encounter: Payer: Self-pay | Admitting: Physician Assistant

## 2019-01-11 VITALS — BP 138/52 | HR 66 | Resp 16 | Ht 67.0 in | Wt 149.2 lb

## 2019-01-11 DIAGNOSIS — M0579 Rheumatoid arthritis with rheumatoid factor of multiple sites without organ or systems involvement: Secondary | ICD-10-CM | POA: Diagnosis not present

## 2019-01-11 DIAGNOSIS — M810 Age-related osteoporosis without current pathological fracture: Secondary | ICD-10-CM

## 2019-01-11 DIAGNOSIS — M19071 Primary osteoarthritis, right ankle and foot: Secondary | ICD-10-CM | POA: Diagnosis not present

## 2019-01-11 DIAGNOSIS — Z8709 Personal history of other diseases of the respiratory system: Secondary | ICD-10-CM

## 2019-01-11 DIAGNOSIS — M19041 Primary osteoarthritis, right hand: Secondary | ICD-10-CM | POA: Diagnosis not present

## 2019-01-11 DIAGNOSIS — M19072 Primary osteoarthritis, left ankle and foot: Secondary | ICD-10-CM

## 2019-01-11 DIAGNOSIS — Z79899 Other long term (current) drug therapy: Secondary | ICD-10-CM | POA: Diagnosis not present

## 2019-01-11 DIAGNOSIS — M19042 Primary osteoarthritis, left hand: Secondary | ICD-10-CM

## 2019-01-11 NOTE — Patient Instructions (Addendum)
Standing Labs We placed an order today for your standing lab work.    Please come back and get your standing labs in November and then every 3 months.  We have open lab daily Monday through Thursday from 8:30-12:30 PM and 1:30-4:30 PM and Friday from 8:30-12:30 PM and 1:30 -4:00 PM at the office of Dr. Bo Merino.   You may experience shorter wait times on Monday and Friday afternoons. The office is located at [address] [place], [place], [place] [address] No appointment is necessary.   Labs are drawn by [facility].  You may receive a bill from [facility] for your lab work.  If you wish to have your labs drawn at another location, please call the office 24 hours in advance to send orders.  If you have any questions regarding directions or hours of operation,  please call [phone].   Just as a reminder please drink plenty of water prior to coming for your lab work. Thanks!  Vaccines You are taking a medication(s) that can suppress your immune system.  The following immunizations are recommended: . Flu annually . Pneumonia (Pneumovax 23 and Prevnar 13 spaced at least 1 year apart) . Shingrix  Please check with your PCP to make sure you are up to date.

## 2019-01-13 LAB — CBC WITH DIFFERENTIAL/PLATELET
Basophils Absolute: 0 10*3/uL (ref 0.0–0.2)
Basos: 1 %
EOS (ABSOLUTE): 0.4 10*3/uL (ref 0.0–0.4)
Eos: 6 %
Hematocrit: 38.6 % (ref 34.0–46.6)
Hemoglobin: 12.7 g/dL (ref 11.1–15.9)
Immature Grans (Abs): 0 10*3/uL (ref 0.0–0.1)
Immature Granulocytes: 0 %
Lymphocytes Absolute: 3.4 10*3/uL — ABNORMAL HIGH (ref 0.7–3.1)
Lymphs: 48 %
MCH: 29.4 pg (ref 26.6–33.0)
MCHC: 32.9 g/dL (ref 31.5–35.7)
MCV: 89 fL (ref 79–97)
Monocytes Absolute: 0.6 10*3/uL (ref 0.1–0.9)
Monocytes: 8 %
Neutrophils Absolute: 2.6 10*3/uL (ref 1.4–7.0)
Neutrophils: 37 %
Platelets: 188 10*3/uL (ref 150–450)
RBC: 4.32 x10E6/uL (ref 3.77–5.28)
RDW: 12.4 % (ref 11.7–15.4)
WBC: 7 10*3/uL (ref 3.4–10.8)

## 2019-01-13 LAB — CMP14+EGFR
ALT: 9 IU/L (ref 0–32)
AST: 11 IU/L (ref 0–40)
Albumin/Globulin Ratio: 1.5 (ref 1.2–2.2)
Albumin: 4 g/dL (ref 3.8–4.8)
Alkaline Phosphatase: 121 IU/L — ABNORMAL HIGH (ref 39–117)
BUN/Creatinine Ratio: 15 (ref 12–28)
BUN: 14 mg/dL (ref 8–27)
Bilirubin Total: 0.4 mg/dL (ref 0.0–1.2)
CO2: 26 mmol/L (ref 20–29)
Calcium: 9.1 mg/dL (ref 8.7–10.3)
Chloride: 104 mmol/L (ref 96–106)
Creatinine, Ser: 0.96 mg/dL (ref 0.57–1.00)
GFR calc Af Amer: 74 mL/min/{1.73_m2} (ref >59)
GFR calc non Af Amer: 64 mL/min/{1.73_m2} (ref >59)
Globulin, Total: 2.6 g/dL (ref 1.5–4.5)
Glucose: 85 mg/dL (ref 65–99)
Potassium: 4.4 mmol/L (ref 3.5–5.2)
Sodium: 142 mmol/L (ref 134–144)
Total Protein: 6.6 g/dL (ref 6.0–8.5)

## 2019-01-13 NOTE — Progress Notes (Signed)
Alk phos is borderline elevated but stable.  Rest of CMP WNL.  Absolute lymphocytes is borderline elevated but trending down.  Rest of CBC WNL.

## 2019-01-17 ENCOUNTER — Other Ambulatory Visit: Payer: Self-pay | Admitting: Rheumatology

## 2019-01-17 NOTE — Telephone Encounter (Signed)
Last Visit: 01/11/19 Next Visit: due in November 2020. Message sent to the front to schedule.  Labs: 01/12/19 Alk phos is borderline elevated but stable. Rest of CMP WNL.  Absolute lymphocytes is borderline elevated but trending down. Rest of CBC WNL.   TB Gold: 10/12/18  Okay to refill per Dr. Estanislado Pandy

## 2019-02-15 ENCOUNTER — Ambulatory Visit (INDEPENDENT_AMBULATORY_CARE_PROVIDER_SITE_OTHER): Payer: BC Managed Care – PPO | Admitting: Family Medicine

## 2019-02-15 ENCOUNTER — Other Ambulatory Visit: Payer: Self-pay

## 2019-02-15 DIAGNOSIS — R062 Wheezing: Secondary | ICD-10-CM

## 2019-02-15 DIAGNOSIS — J310 Chronic rhinitis: Secondary | ICD-10-CM

## 2019-02-15 DIAGNOSIS — J329 Chronic sinusitis, unspecified: Secondary | ICD-10-CM | POA: Diagnosis not present

## 2019-02-15 MED ORDER — ALBUTEROL SULFATE HFA 108 (90 BASE) MCG/ACT IN AERS: INHALATION_SPRAY | RESPIRATORY_TRACT | 2 refills | Status: DC

## 2019-02-15 MED ORDER — AMOXICILLIN-POT CLAVULANATE 875-125 MG PO TABS: 1.0000 | ORAL_TABLET | Freq: Two times a day (BID) | ORAL | 0 refills | Status: DC

## 2019-02-15 MED ORDER — PREDNISONE 20 MG PO TABS: ORAL_TABLET | ORAL | 0 refills | Status: DC

## 2019-02-15 NOTE — Progress Notes (Signed)
   Subjective:  Audio only  Patient ID: CORIONNA BINKOWSKI, female    DOB: Apr 19, 1956, 63 y.o.   MRN: [id number]  Sinusitis This is a new problem. Episode onset: 3 -4 days. (Nose bleed on Sunday, head congestion, sneezing, wheezing off and on. ) Treatments tried: allergy med, inhaler.   Virtual Visit via Telephone Note  I connected with JOSALYN ESTELA on 02/15/19 at  9:30 AM EDT by telephone and verified that I am speaking with the correct person using two identifiers.  Location: Patient: home Provider: office   I discussed the limitations, risks, security and privacy concerns of performing an evaluation and management service by telephone and the availability of in person appointments. I also discussed with the patient that there may be a patient responsible charge related to this service. The patient expressed understanding and agreed to proceed.   History of Present Illness:    Observations/Objective:   Assessment and Plan:   Follow Up Instructions:    I discussed the assessment and treatment plan with the patient. The patient was provided an opportunity to ask questions and all were answered. The patient agreed with the plan and demonstrated an understanding of the instructions.   The patient was advised to call back or seek an in-person evaluation if the symptoms worsen or if the condition fails to improve as anticipated.  I provided 20 minutes of non-face-to-face time during this encounter.   Pt had nasal cong first  Then gets stopped up, Sunday had nose bleed   Pt has had mild wheeze   Fair amnt of drainage  On daily allegra    Review of Systems No headache, no major weight loss or weight gain, no chest pain no back pain abdominal pain no change in bowel habits complete ROS otherwise negative     Objective:   Physical Exam  Virtual      Assessment & Plan:  Impression probable rhinosinusitis with exacerbation reactive airways.  Patient states she  gets this every year with a change in the weather.  No known exposures to anyone with coronavirus.  Plan antibiotics prescribed.  Steroids prescribed.  Albuterol use PRN symptom care discussed warning signs discussed.

## 2019-03-02 ENCOUNTER — Telehealth: Payer: Self-pay | Admitting: Family Medicine

## 2019-03-02 MED ORDER — FLUCONAZOLE 150 MG PO TABS: ORAL_TABLET | ORAL | 0 refills | Status: DC

## 2019-03-02 MED ORDER — FLOVENT HFA 44 MCG/ACT IN AERO: 2.0000 | INHALATION_SPRAY | Freq: Two times a day (BID) | RESPIRATORY_TRACT | 2 refills | Status: DC

## 2019-03-02 NOTE — Telephone Encounter (Signed)
flovent 44 mcg per pfuff, two puffs bid plus three ref  Diflucan 150 one po three d apart

## 2019-03-02 NOTE — Telephone Encounter (Signed)
Patient has yeast infection from antibiotic she was put on last week and needing something called in ,also she would like to try the steroid inhaler you suggested at her last visit. Dawn Meyer

## 2019-03-02 NOTE — Telephone Encounter (Signed)
Prescriptions sent electronically to pharmacy. Patient notified. °

## 2019-03-10 ENCOUNTER — Other Ambulatory Visit: Payer: Self-pay | Admitting: Family Medicine

## 2019-04-05 ENCOUNTER — Other Ambulatory Visit: Payer: Self-pay

## 2019-04-05 DIAGNOSIS — Z20822 Contact with and (suspected) exposure to covid-19: Secondary | ICD-10-CM

## 2019-04-08 LAB — NOVEL CORONAVIRUS, NAA: SARS-CoV-2, NAA: NOT DETECTED

## 2019-06-30 ENCOUNTER — Encounter: Payer: Self-pay | Admitting: Family Medicine

## 2019-07-20 ENCOUNTER — Other Ambulatory Visit: Payer: Self-pay | Admitting: Family Medicine

## 2019-08-04 ENCOUNTER — Ambulatory Visit: Payer: BC Managed Care – PPO | Admitting: Rheumatology

## 2019-08-10 NOTE — Progress Notes (Signed)
Office Visit Note  Patient: Dawn Meyer             Date of Birth: 11-26-1955           MRN: [id number]             PCP: Mikey Kirschner, MD Referring: Mikey Kirschner, MD Visit Date: 08/18/2019 Occupation: '@GUAROCC'$ @  Subjective:  Neck pain   History of Present Illness: Dawn Meyer is a 64 y.o. female with history of seropositive rheumatoid arthritis, osteoarthritis, and osteoporosis.  Patient is on Humira 40 mg subcu injections every 21 days.  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  She states that she took ibuprofen that evening and the discomfort and inflammation resolved.  She states for the past 2-week she has been experiencing some increased discomfort in her neck.  She denies any stiffness or limitation in range of motion.  She has not had any nocturnal pain or symptoms of radiculopathy.   Activities of Daily Living:  Patient reports morning stiffness for 0 minutes.   Patient Denies nocturnal pain.  Difficulty dressing/grooming: Denies Difficulty climbing stairs: Denies Difficulty getting out of chair: Denies Difficulty using hands for taps, buttons, cutlery, and/or writing: Denies  Review of Systems  Constitutional: Negative for fatigue.  HENT: Negative for mouth sores, mouth dryness and nose dryness.   Eyes: Negative for itching and dryness.  Respiratory: Negative for shortness of breath, wheezing and difficulty breathing.   Cardiovascular: Negative for chest pain and palpitations.  Gastrointestinal: Negative for blood in stool, constipation and diarrhea.  Endocrine: Negative for increased urination.  Genitourinary: Negative for difficulty urinating and painful urination.  Musculoskeletal: Positive for arthralgias and joint pain. Negative for joint swelling, myalgias, morning stiffness, muscle tenderness and myalgias.  Skin: Negative for pallor, rash, hair loss and redness.    Allergic/Immunologic: Negative for susceptible to infections.  Neurological: Negative for dizziness, numbness, headaches, memory loss and weakness.  Hematological: Positive for bruising/bleeding tendency.  Psychiatric/Behavioral: Negative for confusion and sleep disturbance.    PMFS History:  Patient Active Problem List   Diagnosis Date Noted  . Rheumatoid arthritis involving multiple sites with positive rheumatoid factor ([place]) 08/18/2016  . Swollen wrist, left 08/18/2016  . 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 Primary osteoarthritis of both feet 08/18/2016  . OP (osteoporosis) 08/18/2016  . Degenerative joint disease involving multiple joints 08/18/2016  . High risk medication use 08/18/2016    Past Medical History:  Diagnosis Date  . Asthma   . RA (rheumatoid arthritis) (HCC)     Family History  Problem Relation Age of Onset  . Congestive Heart Failure Mother   . Heart attack Mother   . Diabetes Father   . Heart Problems Father        pacemaker  . Diverticulitis Sister   . Hypertension Sister   . Heart attack Brother   . Healthy Daughter    Past Surgical History:  Procedure Laterality Date  . MOHS SURGERY    . VAGINAL DELIVERY     Social History   Social History Narrative  . Not on file   Immunization History  Administered Date(s) Administered  . Influenza Inj Mdck Quad Pf 03/12/2017  . Influenza-Unspecified 03/20/2016, 03/11/2018  . Td 08/28/2009     Objective: Vital Signs: BP (!) 153/73 (BP Location: Left Arm, Patient Position: Sitting, Cuff Size: Normal)   Pulse 64  Resp 16   Ht 5' 7.5" (1.715 m)   Wt 154 lb (69.9 kg)   BMI 23.76 kg/m    Physical Exam Vitals and nursing note reviewed.  Constitutional:      Appearance: She is well-developed.  HENT:     Head: Normocephalic and atraumatic.  Eyes:     Conjunctiva/sclera: Conjunctivae normal.  Pulmonary:     Effort: Pulmonary effort is normal.  Abdominal:     General: Bowel  sounds are normal.     Palpations: Abdomen is soft.  Musculoskeletal:     Cervical back: Normal range of motion.  Lymphadenopathy:     Cervical: No cervical adenopathy.  Skin:    General: Skin is warm and dry.     Capillary Refill: Capillary refill takes less than 2 seconds.  Neurological:     Mental Status: She is alert and oriented to person, place, and time.  Psychiatric:        Behavior: Behavior normal.      Musculoskeletal Exam: C-spine has good range of motion with no discomfort at this time.  Thoracic and lumbar spine good range of motion.  No midline spinal tenderness.  Shoulder joints, elbow joints, wrist joints have good range of motion with no tenderness or inflammation.  She has synovial thickening of the right second and third MCP joints.  Hip joints have good range of motion with no discomfort.  Knee joints have good range of motion with no warmth or effusion.  Ankle joints have good range of motion with no tenderness or synovitis.  CDAI Exam: CDAI Score: 0.4  Patient Global: 2 mm; Provider Global: 2 mm Swollen: 0 ; Tender: 0  Joint Exam 08/18/2019   No joint exam has been documented for this visit   There is currently no information documented on the homunculus. Go to the Rheumatology activity and complete the homunculus joint exam.  Investigation: No additional findings.  Imaging: No results found.  Recent Labs: Lab Results  Component Value Date   WBC 7.0 01/12/2019   HGB 12.7 01/12/2019   PLT 188 01/12/2019   NA 142 01/12/2019   K 4.4 01/12/2019   CL 104 01/12/2019   CO2 26 01/12/2019   GLUCOSE 85 01/12/2019   BUN 14 01/12/2019   CREATININE 0.96 01/12/2019   BILITOT 0.4 01/12/2019   ALKPHOS 121 (H) 01/12/2019   AST 11 01/12/2019   ALT 9 01/12/2019   PROT 6.6 01/12/2019   ALBUMIN 4.0 01/12/2019   CALCIUM 9.1 01/12/2019   GFRAA 74 01/12/2019   QFTBGOLD Negative 07/29/2016   QFTBGOLDPLUS Negative 10/12/2018    Speciality Comments: Osteoporosis  manged by Dr. Estanislado Pandy.  She declines treatment-ACY 01/11/2019  Procedures:  No procedures performed Allergies: Doxycycline   Assessment / Plan:     Visit Diagnoses: Rheumatoid arthritis involving multiple sites with positive rheumatoid factor ([place]): She has no synovitis on exam today.  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  She took ibuprofen that evening and her pain and inflammation resolved by the following day.  She has not had any other recent flares.  She is on Humira 40 mg subcutaneous injections every 21 days.  She has not had any increased joint pain or joint swelling while spacing the dose of Humira.  She will continue on the current treatment regimen.  She is overdue to update lab work.  She plans on updating labs next week.  Once labs have resulted and  if they are stable we will refill Humira.  She was advised to notify us if she develops increased joint pain or joint swelling.  She will follow-up in the office in 5 months.  High risk medication use - Humira 40 mg every 21 days.  Last TB gold negative on 10/12/2018 and will monitor yearly.  Standing orders for CBC and CMP are placed today.  A future order for TB gold was placed.  She is overdue to update lab work but plans on having labs next week.- Plan: QuantiFERON-TB Gold Plus, CBC with Differential/Platelet, CMP14+EGFR  Primary osteoarthritis of both hands: She has PIP and DIP thickening consistent with osteoarthritis of both hands.  No tenderness or synovitis was noted.  She has complete fist formation bilaterally.  Joint protection and muscle strengthening were discussed.  Primary osteoarthritis of both feet - She is seeing a podiatrist.  She experiences intermittent discomfort in her feet.  Age-related osteoporosis without current pathological fracture - DEXA 01/19/14 left forearm at the 33% radius site, BMD 0.588 T-score -3.3.   Neck pain: She has been experiencing discomfort in her C-spine  for the past 2 weeks.  She has good range of motion on exam.  She has not had any symptoms of radiculopathy.  No nocturnal pain.  She declined x-rays of the C-spine today.  She was advised to notify us if her neck pain persists or worsens.  She was given a handout of neck exercises to perform.  History of asthma  Orders: Orders Placed This Encounter  Procedures  . QuantiFERON-TB Gold Plus  . CBC with Differential/Platelet  . CMP14+EGFR   No orders of the defined types were placed in this encounter.     Follow-Up Instructions: Return in about 5 months (around 01/18/2020) for Rheumatoid arthritis, Osteoarthritis.   Ofilia Neas, PA-C  Note - This record has been created using Dragon software.  Chart creation errors have been sought, but may not always  have been located. Such creation errors do not reflect on  the standard of medical care.

## 2019-08-18 ENCOUNTER — Other Ambulatory Visit: Payer: Self-pay

## 2019-08-18 ENCOUNTER — Encounter: Payer: Self-pay | Admitting: Physician Assistant

## 2019-08-18 ENCOUNTER — Ambulatory Visit: Payer: BC Managed Care – PPO | Admitting: Physician Assistant

## 2019-08-18 ENCOUNTER — Telehealth: Payer: Self-pay

## 2019-08-18 VITALS — BP 153/73 | HR 64 | Resp 16 | Ht 67.5 in | Wt 154.0 lb

## 2019-08-18 DIAGNOSIS — M19041 Primary osteoarthritis, right hand: Secondary | ICD-10-CM | POA: Diagnosis not present

## 2019-08-18 DIAGNOSIS — M19071 Primary osteoarthritis, right ankle and foot: Secondary | ICD-10-CM

## 2019-08-18 DIAGNOSIS — M0579 Rheumatoid arthritis with rheumatoid factor of multiple sites without organ or systems involvement: Secondary | ICD-10-CM

## 2019-08-18 DIAGNOSIS — Z79899 Other long term (current) drug therapy: Secondary | ICD-10-CM | POA: Diagnosis not present

## 2019-08-18 DIAGNOSIS — M810 Age-related osteoporosis without current pathological fracture: Secondary | ICD-10-CM

## 2019-08-18 DIAGNOSIS — M19042 Primary osteoarthritis, left hand: Secondary | ICD-10-CM

## 2019-08-18 DIAGNOSIS — M19072 Primary osteoarthritis, left ankle and foot: Secondary | ICD-10-CM

## 2019-08-18 DIAGNOSIS — M542 Cervicalgia: Secondary | ICD-10-CM

## 2019-08-18 DIAGNOSIS — Z8709 Personal history of other diseases of the respiratory system: Secondary | ICD-10-CM

## 2019-08-18 NOTE — Telephone Encounter (Signed)
Per Hazel Sams, PA-C, please refill humira pending lab results. Thanks!

## 2019-08-18 NOTE — Patient Instructions (Addendum)
Standing Labs We placed an order today for your standing lab work.    Please come back and get your standing labs in March and every 3 months   We have open lab daily Monday through Thursday from 8:30-12:30 PM and 1:30-4:30 PM and Friday from 8:30-12:30 PM and 1:30-4:00 PM at the office of Dr. Bo Merino.   You may experience shorter wait times on Monday and Friday afternoons. The office is located at [address] [place], [place], [place] [address] No appointment is necessary.   Labs are drawn by [facility].  You may receive a bill from [facility] for your lab work.  If you wish to have your labs drawn at another location, please call the office 24 hours in advance to send orders.  If you have any questions regarding directions or hours of operation,  please call [phone].   Just as a reminder please drink plenty of water prior to coming for your lab work. Thanks!    Neck Exercises Ask your health care provider which exercises are safe for you. Do exercises exactly as told by your health care provider and adjust them as directed. It is normal to feel mild stretching, pulling, tightness, or discomfort as you do these exercises. Stop right away if you feel sudden pain or your pain gets worse. Do not begin these exercises until told by your health care provider. Neck exercises can be important for many reasons. They can improve strength and maintain flexibility in your neck, which will help your upper back and prevent neck pain. Stretching exercises Rotation neck stretching  1. Sit in a chair or stand up. 2. Place your feet flat on the floor, shoulder width apart. 3. Slowly turn your head (rotate) to the right until a slight stretch is felt. Turn it all the way to the right so you can look over your right shoulder. Do not tilt or tip your head. 4. Hold this position for 10-30 seconds. 5. Slowly turn your head (rotate) to the left until a slight stretch is felt. Turn it all the  way to the left so you can look over your left shoulder. Do not tilt or tip your head. 6. Hold this position for 10-30 seconds. Repeat __________ times. Complete this exercise __________ times a day. Neck retraction 1. Sit in a sturdy chair or stand up. 2. Look straight ahead. Do not bend your neck. 3. Use your fingers to push your chin backward (retraction). Do not bend your neck for this movement. Continue to face straight ahead. If you are doing the exercise properly, you will feel a slight sensation in your throat and a stretch at the back of your neck. 4. Hold the stretch for 1-2 seconds. Repeat __________ times. Complete this exercise __________ times a day. Strengthening exercises Neck press 1. Lie on your back on a firm bed or on the floor with a pillow under your head. 2. Use your neck muscles to push your head down on the pillow and straighten your spine. 3. Hold the position as well as you can. Keep your head facing up (in a neutral position) and your chin tucked. 4. Slowly count to 5 while holding this position. Repeat __________ times. Complete this exercise __________ times a day. Isometrics These are exercises in which you strengthen the muscles in your neck while keeping your neck still (isometrics). 1. Sit in a supportive chair and place your hand on your forehead. 2. Keep your head and face facing straight ahead.  Do not flex or extend your neck while doing isometrics. 3. Push forward with your head and neck while pushing back with your hand. Hold for 10 seconds. 4. Do the sequence again, this time putting your hand against the back of your head. Use your head and neck to push backward against the hand pressure. 5. Finally, do the same exercise on either side of your head, pushing sideways against the pressure of your hand. Repeat __________ times. Complete this exercise __________ times a day. Prone head lifts 1. Lie face-down (prone position), resting on your elbows so  that your chest and upper back are raised. 2. Start with your head facing downward, near your chest. Position your chin either on or near your chest. 3. Slowly lift your head upward. Lift until you are looking straight ahead. Then continue lifting your head as far back as you can comfortably stretch. 4. Hold your head up for 5 seconds. Then slowly lower it to your starting position. Repeat __________ times. Complete this exercise __________ times a day. Supine head lifts 1. Lie on your back (supine position), bending your knees to point to the ceiling and keeping your feet flat on the floor. 2. Lift your head slowly off the floor, raising your chin toward your chest. 3. Hold for 5 seconds. Repeat __________ times. Complete this exercise __________ times a day. Scapular retraction 1. Stand with your arms at your sides. Look straight ahead. 2. Slowly pull both shoulders (scapulae) backward and downward (retraction) until you feel a stretch between your shoulder blades in your upper back. 3. Hold for 10-30 seconds. 4. Relax and repeat. Repeat __________ times. Complete this exercise __________ times a day. Contact a health care provider if:  Your neck pain or discomfort gets much worse when you do an exercise.  Your neck pain or discomfort does not improve within 2 hours after you exercise. If you have any of these problems, stop exercising right away. Do not do the exercises again unless your health care provider says that you can. Get help right away if:  You develop sudden, severe neck pain. If this happens, stop exercising right away. Do not do the exercises again unless your health care provider says that you can. This information is not intended to replace advice given to you by your health care provider. Make sure you discuss any questions you have with your health care provider. Document Revised: 03/10/2018 Document Reviewed: 03/10/2018 Elsevier Patient Education  [employer].

## 2019-08-22 ENCOUNTER — Other Ambulatory Visit: Payer: Self-pay | Admitting: Rheumatology

## 2019-08-31 NOTE — Telephone Encounter (Signed)
Patient reminded she is due to update labs. Patient states she her Covid vaccine and will update labs next week.

## 2019-09-14 ENCOUNTER — Telehealth: Payer: Self-pay | Admitting: Rheumatology

## 2019-09-14 ENCOUNTER — Other Ambulatory Visit: Payer: Self-pay

## 2019-09-14 DIAGNOSIS — Z79899 Other long term (current) drug therapy: Secondary | ICD-10-CM

## 2019-09-14 NOTE — Telephone Encounter (Signed)
Patient left a voicemail stating she spoke with Seth Bake last week regarding needing labwork.  Patient states the labwork orders were to be sent to [facility].  Patient states [facility] doesn't have the orders.  Patient requested a return call on her cell phone due to her husband "being a day sleeper."  [phone]

## 2019-09-14 NOTE — Telephone Encounter (Signed)
Spoke with patient and I have released and faxed lab orders (both electronically and paper copy) to [facility] on [address] in [place]. Patient states they will keep the paper copy and she will go next week to have labs drawn. I advised patient to call when she is at [facility] if they do not have orders.

## 2019-09-20 NOTE — Telephone Encounter (Signed)
I released and faxed lab orders on 09/14/2019 (see telephone encounter from that date) after my conversation with patient.

## 2019-09-21 MED ORDER — HUMIRA (2 PEN) 40 MG/0.4ML ~~LOC~~ AJKT: AUTO-INJECTOR | SUBCUTANEOUS | 0 refills | Status: DC

## 2019-09-21 NOTE — Telephone Encounter (Signed)
Last Visit: 08/18/2019  Next Visit: 01/25/2020 Labs: 09/20/2019 CBC and CMP WNL. TB gold pending. TB Gold: 10/12/2018 negative   Okay to refill per Dr. Estanislado Pandy.

## 2019-09-21 NOTE — Progress Notes (Signed)
CBC and CMP WNL. TB gold pending.

## 2019-09-21 NOTE — Addendum Note (Signed)
Addended by: Earnestine Mealing on: 09/21/2019 01:57 PM   Modules accepted: Orders

## 2019-09-23 LAB — QUANTIFERON-TB GOLD PLUS

## 2019-09-24 LAB — CMP14+EGFR
ALT: 11 IU/L (ref 0–32)
AST: 16 IU/L (ref 0–40)
Albumin/Globulin Ratio: 1.7 (ref 1.2–2.2)
Albumin: 4 g/dL (ref 3.8–4.8)
Alkaline Phosphatase: 108 IU/L (ref 39–117)
BUN/Creatinine Ratio: 14 (ref 12–28)
BUN: 14 mg/dL (ref 8–27)
Bilirubin Total: 0.3 mg/dL (ref 0.0–1.2)
CO2: 22 mmol/L (ref 20–29)
Calcium: 8.9 mg/dL (ref 8.7–10.3)
Chloride: 108 mmol/L — ABNORMAL HIGH (ref 96–106)
Creatinine, Ser: 1 mg/dL (ref 0.57–1.00)
GFR calc Af Amer: 70 mL/min/{1.73_m2} (ref >59)
GFR calc non Af Amer: 61 mL/min/{1.73_m2} (ref >59)
Globulin, Total: 2.3 g/dL (ref 1.5–4.5)
Glucose: 79 mg/dL (ref 65–99)
Potassium: 3.9 mmol/L (ref 3.5–5.2)
Sodium: 142 mmol/L (ref 134–144)
Total Protein: 6.3 g/dL (ref 6.0–8.5)

## 2019-09-24 LAB — CBC WITH DIFFERENTIAL/PLATELET
Basophils Absolute: 0 10*3/uL (ref 0.0–0.2)
Basos: 0 %
EOS (ABSOLUTE): 0.3 10*3/uL (ref 0.0–0.4)
Eos: 6 %
Hematocrit: 37.2 % (ref 34.0–46.6)
Hemoglobin: 12.5 g/dL (ref 11.1–15.9)
Immature Grans (Abs): 0 10*3/uL (ref 0.0–0.1)
Immature Granulocytes: 0 %
Lymphocytes Absolute: 3 10*3/uL (ref 0.7–3.1)
Lymphs: 55 %
MCH: 30.3 pg (ref 26.6–33.0)
MCHC: 33.6 g/dL (ref 31.5–35.7)
MCV: 90 fL (ref 79–97)
Monocytes Absolute: 0.5 10*3/uL (ref 0.1–0.9)
Monocytes: 9 %
Neutrophils Absolute: 1.6 10*3/uL (ref 1.4–7.0)
Neutrophils: 30 %
Platelets: 156 10*3/uL (ref 150–450)
RBC: 4.13 x10E6/uL (ref 3.77–5.28)
RDW: 12.4 % (ref 11.7–15.4)
WBC: 5.5 10*3/uL (ref 3.4–10.8)

## 2019-10-06 ENCOUNTER — Other Ambulatory Visit: Payer: Self-pay | Admitting: Family Medicine

## 2019-12-12 ENCOUNTER — Other Ambulatory Visit: Payer: Self-pay | Admitting: Rheumatology

## 2019-12-12 DIAGNOSIS — M0579 Rheumatoid arthritis with rheumatoid factor of multiple sites without organ or systems involvement: Secondary | ICD-10-CM

## 2019-12-13 NOTE — Telephone Encounter (Signed)
Last Visit: 08/18/2019 Next Visit: 01/25/2020 Labs: 09/20/2019 CBC and CMP WNL. TB Gold: 09/20/2019 Neg   Current Dose per office note on 08/18/2019:  Humira 40 mg every 21 days. Dx: Rheumatoid arthritis  Okay to refill per Dr. Estanislado Pandy

## 2020-01-11 NOTE — Progress Notes (Deleted)
Office Visit Note  Patient: Dawn Meyer             Date of Birth: 1956-04-21           MRN: [id number]             PCP: Mikey Kirschner, MD (Inactive) Referring: Mikey Kirschner, MD Visit Date: 01/25/2020 Occupation: @GUAROCC @  Subjective:  No chief complaint on file.   History of Present Illness: Dawn Meyer is a 64 y.o. female ***   Activities of Daily Living:  Patient reports morning stiffness for *** {minute/hour:19697}.   Patient {ACTIONS;DENIES/REPORTS:21021675::"Denies"} nocturnal pain.  Difficulty dressing/grooming: {ACTIONS;DENIES/REPORTS:21021675::"Denies"} Difficulty climbing stairs: {ACTIONS;DENIES/REPORTS:21021675::"Denies"} Difficulty getting out of chair: {ACTIONS;DENIES/REPORTS:21021675::"Denies"} Difficulty using hands for taps, buttons, cutlery, and/or writing: {ACTIONS;DENIES/REPORTS:21021675::"Denies"}  No Rheumatology ROS completed.   PMFS History:  Patient Active Problem List   Diagnosis Date Noted  . Rheumatoid arthritis involving multiple sites with positive rheumatoid factor ([place]) 08/18/2016  . Swollen wrist, left 08/18/2016  . 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 Primary osteoarthritis of both feet 08/18/2016  . OP (osteoporosis) 08/18/2016  . Degenerative joint disease involving multiple joints 08/18/2016  . High risk medication use 08/18/2016    Past Medical History:  Diagnosis Date  . Asthma   . RA (rheumatoid arthritis) (HCC)     Family History  Problem Relation Age of Onset  . Congestive Heart Failure Mother   . Heart attack Mother   . Diabetes Father   . Heart Problems Father        pacemaker  . Diverticulitis Sister   . Hypertension Sister   . Heart attack Brother   . Healthy Daughter    Past Surgical History:  Procedure Laterality Date  . MOHS SURGERY    . VAGINAL DELIVERY     Social History   Social History Narrative  . Not on file   Immunization History  Administered Date(s)  Administered  . Influenza Inj Mdck Quad Pf 03/12/2017  . Influenza-Unspecified 03/20/2016, 03/11/2018  . Janssen (J&J) SARS-COV-2 Vaccination 08/31/2019  . Td 08/28/2009     Objective: Vital Signs: There were no vitals taken for this visit.   Physical Exam   Musculoskeletal Exam: ***  CDAI Exam: CDAI Score: -- Patient Global: --; Provider Global: -- Swollen: --; Tender: -- Joint Exam 01/25/2020   No joint exam has been documented for this visit   There is currently no information documented on the homunculus. Go to the Rheumatology activity and complete the homunculus joint exam.  Investigation: No additional findings.  Imaging: No results found.  Recent Labs: Lab Results  Component Value Date   WBC 5.5 09/20/2019   HGB 12.5 09/20/2019   PLT 156 09/20/2019   NA 142 09/20/2019   K 3.9 09/20/2019   CL 108 (H) 09/20/2019   CO2 22 09/20/2019   GLUCOSE 79 09/20/2019   BUN 14 09/20/2019   CREATININE 1.00 09/20/2019   BILITOT 0.3 09/20/2019   ALKPHOS 108 09/20/2019   AST 16 09/20/2019   ALT 11 09/20/2019   PROT 6.3 09/20/2019   ALBUMIN 4.0 09/20/2019   CALCIUM 8.9 09/20/2019   GFRAA 70 09/20/2019   QFTBGOLD Negative 07/29/2016   QFTBGOLDPLUS WILL FOLLOW 09/20/2019    Speciality Comments: Osteoporosis manged by Dr. Estanislado Pandy.  She declines treatment-ACY 01/11/2019  Procedures:  No procedures performed Allergies: Doxycycline   Assessment / Plan:     Visit Diagnoses: Rheumatoid arthritis involving multiple sites with positive rheumatoid factor ([place])  High risk medication use - Humira 40 mg every 21 days.  Last TB gold negative on 10/12/2018 and will monitor yearly.    Primary osteoarthritis of both hands  Primary osteoarthritis of both feet  Age-related osteoporosis without current pathological fracture  History of asthma  Orders: No orders of the defined types were placed in this encounter.  No orders of the defined types were placed in this  encounter.   Face-to-face time spent with patient was *** minutes. Greater than 50% of time was spent in counseling and coordination of care.  Follow-Up Instructions: No follow-ups on file.   Ofilia Neas, PA-C  Note - This record has been created using Dragon software.  Chart creation errors have been sought, but may not always  have been located. Such creation errors do not reflect on  the standard of medical care.

## 2020-01-25 ENCOUNTER — Ambulatory Visit: Payer: BC Managed Care – PPO | Admitting: Physician Assistant

## 2020-01-25 DIAGNOSIS — M19041 Primary osteoarthritis, right hand: Secondary | ICD-10-CM

## 2020-01-25 DIAGNOSIS — M19071 Primary osteoarthritis, right ankle and foot: Secondary | ICD-10-CM

## 2020-01-25 DIAGNOSIS — M0579 Rheumatoid arthritis with rheumatoid factor of multiple sites without organ or systems involvement: Secondary | ICD-10-CM

## 2020-01-25 DIAGNOSIS — Z8709 Personal history of other diseases of the respiratory system: Secondary | ICD-10-CM

## 2020-01-25 DIAGNOSIS — Z79899 Other long term (current) drug therapy: Secondary | ICD-10-CM

## 2020-01-25 DIAGNOSIS — M810 Age-related osteoporosis without current pathological fracture: Secondary | ICD-10-CM

## 2020-02-02 ENCOUNTER — Telehealth: Payer: Self-pay | Admitting: Family Medicine

## 2020-02-02 MED ORDER — ALBUTEROL SULFATE HFA 108 (90 BASE) MCG/ACT IN AERS: INHALATION_SPRAY | RESPIRATORY_TRACT | 0 refills | Status: DC

## 2020-02-02 NOTE — Telephone Encounter (Signed)
Sent in refill for inhaler. Dr. Lovena Le

## 2020-02-02 NOTE — Telephone Encounter (Signed)
Please contact patient to have her set up appt with Dr.Taylor. thank you!

## 2020-02-02 NOTE — Telephone Encounter (Signed)
Pls have pt follow up in next 1-2 wks for recheck.  If continuing to wheeze. Thx. Dr. Celestia Khat in scirpt.

## 2020-02-02 NOTE — Telephone Encounter (Signed)
[place] requesting refill on Albuterol inhaler. Inhale 2 puffs po into lungs q 6 hours prn wheezing shortness of breath. Pt last seen 02/15/2019. Please advise. Thank you  Will call patient to get her set up to establish care.   Front-pt needs office visit with Dr.Taylor to establish care. Please contact pt to have her set up appt. Thank you!

## 2020-02-02 NOTE — Telephone Encounter (Signed)
Pt has issues with wheezing. Please advise. Thank you.

## 2020-02-03 NOTE — Telephone Encounter (Signed)
Schedule appointment for medication follow up for 9/27

## 2020-02-10 NOTE — Progress Notes (Addendum)
Office Visit Note  Patient: Dawn Meyer             Date of Birth: 1956/03/15           MRN: [id number]             PCP: Erven Colla, DO Referring: Mikey Kirschner, MD Visit Date: 02/22/2020 Occupation: @GUAROCC @  Subjective:  Medication monitoring   History of Present Illness: Dawn Meyer is a 64 y.o. female with history of seropositive rheumatoid arthritis, osteoarthritis, and osteoporosis.  She is on Humira 40 mg sq injections once every 21 days.  She denies any recent rheumatoid arthritis flares.  She reports that 3 to 4 weeks ago she fell while assisting her 60 year old father.  She states that she landed on her left wrist and had some discomfort and swelling which has since resolved.  She is not having any discomfort in her left wrist at this time.  She denies any other joint pain or joint swelling at this time. She denies any recent infections.  She received the Why vaccination in April 2021.  She continues to wear a mask and tries to social distance.    Activities of Daily Living:  Patient reports morning stiffness for 0 none.   Patient Denies nocturnal pain.  Difficulty dressing/grooming: Denies Difficulty climbing stairs: Denies Difficulty getting out of chair: Denies Difficulty using hands for taps, buttons, cutlery, and/or writing: Denies  Review of Systems  Constitutional: Negative for fatigue.  HENT: Negative for mouth sores, mouth dryness and nose dryness.   Eyes: Negative for pain, visual disturbance and dryness.  Respiratory: Negative for cough, hemoptysis, shortness of breath and difficulty breathing.   Cardiovascular: Negative for chest pain, palpitations, hypertension and swelling in legs/feet.  Gastrointestinal: Negative for blood in stool, constipation and diarrhea.  Endocrine: Negative for excessive thirst and increased urination.  Genitourinary: Negative for difficulty urinating and painful urination.    Musculoskeletal: Positive for joint swelling. Negative for arthralgias, joint pain, myalgias, muscle weakness, morning stiffness, muscle tenderness and myalgias.  Skin: Negative for color change, pallor, rash, hair loss, nodules/bumps, skin tightness, ulcers and sensitivity to sunlight.  Allergic/Immunologic: Negative for susceptible to infections.  Neurological: Negative for dizziness, numbness, headaches and weakness.  Hematological: Negative for bruising/bleeding tendency and swollen glands.  Psychiatric/Behavioral: Negative for depressed mood and sleep disturbance. The patient is not nervous/anxious.     PMFS History:  Patient Active Problem List   Diagnosis Date Noted  . Rheumatoid arthritis involving multiple sites with positive rheumatoid factor ([place]) 08/18/2016  . Swollen wrist, left 08/18/2016  . 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 Primary osteoarthritis of both feet 08/18/2016  . OP (osteoporosis) 08/18/2016  . Degenerative joint disease involving multiple joints 08/18/2016  . High risk medication use 08/18/2016    Past Medical History:  Diagnosis Date  . Asthma   . RA (rheumatoid arthritis) (HCC)     Family History  Problem Relation Age of Onset  . Congestive Heart Failure Mother   . Heart attack Mother   . Diabetes Father   . Heart Problems Father        pacemaker  . Diverticulitis Sister   . Hypertension Sister   . Heart attack Brother   . Healthy Daughter    Past Surgical History:  Procedure Laterality Date  . MOHS SURGERY    . VAGINAL DELIVERY     Social History   Social History Narrative  . Not  on file   Immunization History  Administered Date(s) Administered  . Influenza Inj Mdck Quad Pf 03/12/2017  . Influenza-Unspecified 03/20/2016, 03/11/2018  . Janssen (J&J) SARS-COV-2 Vaccination 08/31/2019  . Td 08/28/2009     Objective: Vital Signs: BP (!) 151/64 (BP Location: Left Arm, Patient Position: Sitting, Cuff Size: Normal)    Pulse 60   Resp 16   Ht 5' 7.5" (1.715 m)   Wt 152 lb 3.2 oz (69 kg)   BMI 23.49 kg/m    Physical Exam Vitals and nursing note reviewed.  Constitutional:      Appearance: She is well-developed.  HENT:     Head: Normocephalic and atraumatic.  Eyes:     Conjunctiva/sclera: Conjunctivae normal.  Pulmonary:     Effort: Pulmonary effort is normal.  Abdominal:     Palpations: Abdomen is soft.  Musculoskeletal:     Cervical back: Normal range of motion.  Skin:    General: Skin is warm and dry.     Capillary Refill: Capillary refill takes less than 2 seconds.  Neurological:     Mental Status: She is alert and oriented to person, place, and time.  Psychiatric:        Behavior: Behavior normal.      Musculoskeletal Exam: C-spine, thoracic spine, lumbar spine have good range of motion.  Shoulder joints, elbow joints, wrist joints, MCPs, PIPs, DIPs good range of motion with no synovitis.  Synovial thickening of the right second MCP joint noted.  Complete fist formation bilaterally.  Hip joints, knee joints, ankle joints, MTPs, PIPs, DIPs have good range of motion with no synovitis.  No warmth or effusion of bilateral knee joints.  No tenderness or swelling of ankle joints.  CDAI Exam: CDAI Score: 0.6  Patient Global: 3 mm; Provider Global: 3 mm Swollen: 1 ; Tender: 0  Joint Exam 02/22/2020      Right  Left  Ankle  Swollen         Investigation: No additional findings.  Imaging: No results found.  Recent Labs: Lab Results  Component Value Date   WBC 5.5 09/20/2019   HGB 12.5 09/20/2019   PLT 156 09/20/2019   NA 142 09/20/2019   K 3.9 09/20/2019   CL 108 (H) 09/20/2019   CO2 22 09/20/2019   GLUCOSE 79 09/20/2019   BUN 14 09/20/2019   CREATININE 1.00 09/20/2019   BILITOT 0.3 09/20/2019   ALKPHOS 108 09/20/2019   AST 16 09/20/2019   ALT 11 09/20/2019   PROT 6.3 09/20/2019   ALBUMIN 4.0 09/20/2019   CALCIUM 8.9 09/20/2019   GFRAA 70 09/20/2019   QFTBGOLD Negative  07/29/2016   QFTBGOLDPLUS WILL FOLLOW 09/20/2019    Speciality Comments: Osteoporosis manged by Dr. Estanislado Pandy.  She declines treatment-ACY 01/11/2019  Procedures:  No procedures performed Allergies: Doxycycline   Assessment / Plan:     Visit Diagnoses: Rheumatoid arthritis involving multiple sites with positive rheumatoid factor ([place]): Warmth of the right ankle joint was noted on exam today.  She is not experiencing any discomfort and has no joint tenderness at this time.  She is followed closely by her podiatrist.  She has synovial thickening of the right second MCP joint but no tenderness or inflammation was noted on exam today.  Overall she is clinically been doing well on Humira 40 mg subcutaneous injections every 21 days.  She has not had any recent rheumatoid arthritis flares.  She will continue on Humira as prescribed.  She was advised to notify  us if she develops increased joint pain or joint swelling.  She will follow-up in the office in 5 months.  High risk medication use - Humira 40 mg every 21 days.TB gold negative on 09/20/2019 and will continue to be monitored yearly.  CBC and CMP were drawn on 09/20/2019.  She is overdue to update lab work.  She has standing orders for CBC and CMP in placed for [facility].  She received her COVID-19 vaccination on 09/10/2019.  She was advised to notify us or her PCP if she develops a COVID-19 infection in order to receive the antibody infusion.  She was encouraged to continue to wear her mask and social distance.  She was advised to hold Humira if she develops signs or symptoms of an infection and to resume once the infection has cleared.  Primary osteoarthritis of both hands: She has PIP and DIP thickening consistent with osteoarthritis of both hands.  No tenderness or inflammation was noted.  She is able to make a complete fist bilaterally.  Joint protection and muscle strengthening were discussed.  Primary osteoarthritis of both feet: She has PIP and  DIP thickening consistent with osteoarthritis of both feet.  No tenderness to palpation on examination today.  She is followed closely by her podiatrist.  Age-related osteoporosis without current pathological fracture - DEXA 01/19/14 left forearm at the 33% radius site, BMD 0.588 T-score -3.3.  I discussed the importance of updating her bone density but she declined at this time.  History of asthma  Orders: No orders of the defined types were placed in this encounter.  No orders of the defined types were placed in this encounter.    Follow-Up Instructions: Return in about 5 months (around 07/23/2020) for Rheumatoid arthritis, Osteoarthritis, Osteoporosis.   Ofilia Neas, PA-C  Note - This record has been created using Dragon software.  Chart creation errors have been sought, but may not always  have been located. Such creation errors do not reflect on  the standard of medical care.

## 2020-02-20 ENCOUNTER — Ambulatory Visit: Payer: BC Managed Care – PPO | Admitting: Family Medicine

## 2020-02-20 ENCOUNTER — Encounter: Payer: Self-pay | Admitting: Family Medicine

## 2020-02-20 ENCOUNTER — Other Ambulatory Visit: Payer: Self-pay

## 2020-02-20 VITALS — BP 126/74 | HR 88 | Temp 98.0°F | Ht 67.5 in | Wt 152.8 lb

## 2020-02-20 DIAGNOSIS — Z720 Tobacco use: Secondary | ICD-10-CM | POA: Diagnosis not present

## 2020-02-20 DIAGNOSIS — M0579 Rheumatoid arthritis with rheumatoid factor of multiple sites without organ or systems involvement: Secondary | ICD-10-CM | POA: Diagnosis not present

## 2020-02-20 DIAGNOSIS — M158 Other polyosteoarthritis: Secondary | ICD-10-CM | POA: Diagnosis not present

## 2020-02-20 DIAGNOSIS — J449 Chronic obstructive pulmonary disease, unspecified: Secondary | ICD-10-CM

## 2020-02-20 MED ORDER — ALBUTEROL SULFATE HFA 108 (90 BASE) MCG/ACT IN AERS: INHALATION_SPRAY | RESPIRATORY_TRACT | 5 refills | Status: DC

## 2020-02-20 NOTE — Progress Notes (Signed)
Patient ID: Dawn Meyer, female    DOB: 1955/05/29, 64 y.o.   MRN: [id number]   Chief Complaint  Patient presents with  . Arthritis   Subjective:    HPI   Pt here to establish care and follow up on medications.  Sick with resp illness at changes of seasons.  When covid came up, feeling needing to keep the lungs open and using inhaler.  No diagnosis of copd or asthma.  However, pt is a smoker. Wearing mask and has been staying well. Using inhaler to "open lungs." When it was hot and humid needing to use inhaler. Was outside a lot. Smoker, and on a plan to quit. Seeing RA and taking humaira  Currently smoking- 3/4 to 1ppd . Down to 6-9 cig per day. Dec and stay at that for a few weeks. Then dec a few after that. Not wanting to take a med to stop it. Best friend had covid and then got very ill, unvaccinated and smokes.  occ hearing wheezing. Summer was worse. With large heat and humidity.  Had quant testing for TB- and was negative.  Albuterol using it daily in summer. In past week- much less.  Doing 1 puff occ.  Still using flovent daily.    Medical History Suly has a past medical history of Asthma and RA (rheumatoid arthritis) ([place]).   Outpatient Encounter Medications as of 02/20/2020  Medication Sig  . albuterol (VENTOLIN HFA) 108 (90 Base) MCG/ACT inhaler INHALE 2 PUFFS BY MOUTH INTO THE LUNGS EVERY 6 HOURS AS NEEDED FOR WHEEZING OR SHORTNESS OF BREATH  . fexofenadine (ALLEGRA) 180 MG tablet Take 180 mg by mouth daily as needed for allergies or rhinitis.  Marland Kitchen FLOVENT HFA 44 MCG/ACT inhaler INHALE TWO PUFFS INTO THE LUNGS TWO TIMES DAILY  . HUMIRA PEN 40 MG/0.4ML PNKT INJECT 1 PEN UNDER THE SKIN EVERY 21 DAYS.  . [DISCONTINUED] albuterol (VENTOLIN HFA) 108 (90 Base) MCG/ACT inhaler INHALE 2 PUFFS BY MOUTH INTO THE LUNGS EVERY 6 HOURS AS NEEDED FOR WHEEZING OR SHORTNESS OF BREATH   No facility-administered encounter medications on file as of 02/20/2020.      Review of Systems  Constitutional: Negative for chills and fever.  HENT: Negative for congestion, rhinorrhea and sore throat.   Respiratory: Negative for cough, shortness of breath and wheezing.   Cardiovascular: Negative for chest pain and leg swelling.  Gastrointestinal: Negative for abdominal pain, diarrhea, nausea and vomiting.  Genitourinary: Negative for dysuria and frequency.  Musculoskeletal: Negative for arthralgias and back pain.  Skin: Negative for rash.  Neurological: Negative for dizziness, weakness and headaches.     Vitals BP 126/74   Pulse 88   Temp 98 F (36.7 C)   Ht 5' 7.5" (1.715 m)   Wt 152 lb 12.8 oz (69.3 kg)   SpO2 99%   BMI 23.58 kg/m   Objective:   Physical Exam Vitals and nursing note reviewed.  Constitutional:      Appearance: Normal appearance.  HENT:     Head: Normocephalic and atraumatic.     Nose: Nose normal.     Mouth/Throat:     Mouth: Mucous membranes are moist.     Pharynx: Oropharynx is clear.  Eyes:     Extraocular Movements: Extraocular movements intact.     Conjunctiva/sclera: Conjunctivae normal.     Pupils: Pupils are equal, round, and reactive to light.  Cardiovascular:     Rate and Rhythm: Normal rate and regular rhythm.  Pulses: Normal pulses.     Heart sounds: Normal heart sounds.  Pulmonary:     Effort: Pulmonary effort is normal.     Breath sounds: Normal breath sounds. No wheezing, rhonchi or rales.  Musculoskeletal:        General: Normal range of motion.     Right lower leg: No edema.     Left lower leg: No edema.  Skin:    General: Skin is warm and dry.     Findings: No lesion or rash.  Neurological:     General: No focal deficit present.     Mental Status: She is alert and oriented to person, place, and time.  Psychiatric:        Mood and Affect: Mood normal.        Behavior: Behavior normal.      Assessment and Plan   1. Chronic obstructive pulmonary disease, unspecified COPD type  (HCC) - albuterol (VENTOLIN HFA) 108 (90 Base) MCG/ACT inhaler; INHALE 2 PUFFS BY MOUTH INTO THE LUNGS EVERY 6 HOURS AS NEEDED FOR WHEEZING OR SHORTNESS OF BREATH  Dispense: 6.7 g; Refill: 5  2. Tobacco abuse  3. Other osteoarthritis involving multiple joints  4. Rheumatoid arthritis involving multiple sites with positive rheumatoid factor (HCC)   Gave inhaler.  Advising to quit smoking. Pt trying to quit on own.  Not wanting to try medications or patches.  RA- seeing rheumatologist.  Cont meds.   F/u 68mo or prn.

## 2020-02-22 ENCOUNTER — Other Ambulatory Visit: Payer: Self-pay

## 2020-02-22 ENCOUNTER — Ambulatory Visit: Payer: BC Managed Care – PPO | Admitting: Physician Assistant

## 2020-02-22 ENCOUNTER — Encounter: Payer: Self-pay | Admitting: Physician Assistant

## 2020-02-22 VITALS — BP 151/64 | HR 60 | Resp 16 | Ht 67.5 in | Wt 152.2 lb

## 2020-02-22 DIAGNOSIS — M19072 Primary osteoarthritis, left ankle and foot: Secondary | ICD-10-CM

## 2020-02-22 DIAGNOSIS — M0579 Rheumatoid arthritis with rheumatoid factor of multiple sites without organ or systems involvement: Secondary | ICD-10-CM | POA: Diagnosis not present

## 2020-02-22 DIAGNOSIS — Z79899 Other long term (current) drug therapy: Secondary | ICD-10-CM

## 2020-02-22 DIAGNOSIS — M19071 Primary osteoarthritis, right ankle and foot: Secondary | ICD-10-CM | POA: Diagnosis not present

## 2020-02-22 DIAGNOSIS — Z8709 Personal history of other diseases of the respiratory system: Secondary | ICD-10-CM

## 2020-02-22 DIAGNOSIS — M19042 Primary osteoarthritis, left hand: Secondary | ICD-10-CM

## 2020-02-22 DIAGNOSIS — M19041 Primary osteoarthritis, right hand: Secondary | ICD-10-CM | POA: Diagnosis not present

## 2020-02-22 DIAGNOSIS — M810 Age-related osteoporosis without current pathological fracture: Secondary | ICD-10-CM

## 2020-02-22 NOTE — Patient Instructions (Signed)
COVID-19 vaccine recommendations:   COVID-19 vaccine is recommended for everyone (unless you are allergic to a vaccine component), even if you are on a medication that suppresses your immune system.   If you are on Methotrexate, Cellcept (mycophenolate), Rinvoq, Morrie Sheldon, and Olumiant- hold the medication for 1 week after each vaccine. Hold Methotrexate for 2 weeks after the single dose COVID-19 vaccine.   If you are on Orencia subcutaneous injection - hold medication one week prior to and one week after the first COVID-19 vaccine dose (only).   If you are on Orencia IV infusions- time vaccination administration so that the first COVID-19 vaccination will occur four weeks after the infusion and postpone the subsequent infusion by one week.   If you are on Cyclophosphamide or Rituxan infusions please contact your doctor prior to receiving the COVID-19 vaccine.   Do not take Tylenol or any anti-inflammatory medications (NSAIDs) 24 hours prior to the COVID-19 vaccination.   There is no direct evidence about the efficacy of the COVID-19 vaccine in individuals who are on medications that suppress the immune system.   Even if you are fully vaccinated, and you are on any medications that suppress your immune system, please continue to wear a mask, maintain at least six feet social distance and practice hand hygiene.   If you develop a COVID-19 infection, please contact your PCP or our office to determine if you need antibody infusion.  The booster vaccine is now available for immunocompromised patients. It is advised that if you had Pfizer vaccine you should get Coca-Cola booster.  If you had a Moderna vaccine then you should get a Moderna booster. Johnson and Wynetta Emery does not have a booster vaccine at this time.  Please see the following web sites for updated information.    [email or web address]://[email or web address]-[email or web address]-Patient-[email or web address]  [email or web address]  Standing Labs We placed an order today for your standing lab work.   Please have your standing labs drawn in September and every 3 months   If possible, please have your labs drawn 2 weeks prior to your appointment so that the provider can discuss your results at your appointment.  We have open lab daily Monday through Thursday from 8:30-12:30 PM and 1:30-4:30 PM and Friday from 8:30-12:30 PM and 1:30-4:00 PM at the office of Dr. Bo Merino, [facility].   Please be advised, patients with office appointments requiring lab work will take precedents over walk-in lab work.  If possible, please come for your lab work on Monday and Friday afternoons, as you may experience shorter wait times. The office is located at [address], [place], [place], [place] [address] No appointment is necessary.   Labs are drawn by [facility]. Please bring your co-pay at the time of your lab draw.  You may receive a bill from [facility] for your lab work.  If you wish to have your labs drawn at another location, please call the office 24 hours in advance to send orders.  If you have any questions regarding directions or hours of operation,  please call [phone].   As a reminder, please drink plenty of water prior to coming for your lab work. Thanks!

## 2020-03-13 ENCOUNTER — Telehealth: Payer: Self-pay | Admitting: Rheumatology

## 2020-03-13 ENCOUNTER — Other Ambulatory Visit: Payer: Self-pay | Admitting: *Deleted

## 2020-03-13 DIAGNOSIS — Z79899 Other long term (current) drug therapy: Secondary | ICD-10-CM

## 2020-03-13 NOTE — Telephone Encounter (Signed)
Lab Orders released. Patient advised.  

## 2020-03-13 NOTE — Telephone Encounter (Signed)
Patient left a message requesting orders for labs be sent to [facility]. Patient going today.  Patient requests you call to inform her once this has been done.

## 2020-03-15 LAB — CBC WITH DIFFERENTIAL/PLATELET
Basophils Absolute: 0 10*3/uL (ref 0.0–0.2)
Basos: 1 %
EOS (ABSOLUTE): 0.4 10*3/uL (ref 0.0–0.4)
Eos: 5 %
Hematocrit: 36.9 % (ref 34.0–46.6)
Hemoglobin: 12.7 g/dL (ref 11.1–15.9)
Immature Grans (Abs): 0 10*3/uL (ref 0.0–0.1)
Immature Granulocytes: 0 %
Lymphocytes Absolute: 3.3 10*3/uL — ABNORMAL HIGH (ref 0.7–3.1)
Lymphs: 50 %
MCH: 30.6 pg (ref 26.6–33.0)
MCHC: 34.4 g/dL (ref 31.5–35.7)
MCV: 89 fL (ref 79–97)
Monocytes Absolute: 0.5 10*3/uL (ref 0.1–0.9)
Monocytes: 8 %
Neutrophils Absolute: 2.4 10*3/uL (ref 1.4–7.0)
Neutrophils: 36 %
Platelets: 165 10*3/uL (ref 150–450)
RBC: 4.15 x10E6/uL (ref 3.77–5.28)
RDW: 12.5 % (ref 11.7–15.4)
WBC: 6.7 10*3/uL (ref 3.4–10.8)

## 2020-03-15 LAB — CMP14+EGFR
ALT: 14 IU/L (ref 0–32)
AST: 13 IU/L (ref 0–40)
Albumin/Globulin Ratio: 1.4 (ref 1.2–2.2)
Albumin: 3.9 g/dL (ref 3.8–4.8)
Alkaline Phosphatase: 125 IU/L — ABNORMAL HIGH (ref 44–121)
BUN/Creatinine Ratio: 13 (ref 12–28)
BUN: 12 mg/dL (ref 8–27)
Bilirubin Total: 0.4 mg/dL (ref 0.0–1.2)
CO2: 22 mmol/L (ref 20–29)
Calcium: 9.1 mg/dL (ref 8.7–10.3)
Chloride: 107 mmol/L — ABNORMAL HIGH (ref 96–106)
Creatinine, Ser: 0.91 mg/dL (ref 0.57–1.00)
GFR calc Af Amer: 78 mL/min/{1.73_m2} (ref >59)
GFR calc non Af Amer: 68 mL/min/{1.73_m2} (ref >59)
Globulin, Total: 2.8 g/dL (ref 1.5–4.5)
Glucose: 82 mg/dL (ref 65–99)
Potassium: 4.3 mmol/L (ref 3.5–5.2)
Sodium: 144 mmol/L (ref 134–144)
Total Protein: 6.7 g/dL (ref 6.0–8.5)

## 2020-03-15 NOTE — Progress Notes (Signed)
Absolute lymphocyte is borderline elevated. WBC count is WNL.  Rest of CBC WNL.   Alk phos is borderline elevated. We will continue to monitor closely.

## 2020-04-11 ENCOUNTER — Other Ambulatory Visit: Payer: Self-pay | Admitting: Rheumatology

## 2020-04-11 DIAGNOSIS — M0579 Rheumatoid arthritis with rheumatoid factor of multiple sites without organ or systems involvement: Secondary | ICD-10-CM

## 2020-04-11 NOTE — Telephone Encounter (Signed)
Last Visit: 02/22/2020 Next Visit: 07/25/2020 Labs: 03/13/2020 Absolute lymphocyte is borderline elevated. WBC count is WNL. Rest of CBC WNL.  Alk phos is borderline elevated. TB Gold: 09/20/2019 Neg   Current Dose per office note 02/22/2020: Humira 40 mg every 21 days.  DX: Rheumatoid arthritis involving multiple sites with positive rheumatoid factor   Okay to refill Humira?

## 2020-04-23 ENCOUNTER — Telehealth: Payer: Self-pay | Admitting: Pharmacist

## 2020-04-23 NOTE — Telephone Encounter (Addendum)
Submitted a Prior Authorization request to [facility] for [place] via Cover My Meds. Will update once we receive a response.  Cover My Meds key: [place] phone: [phone]  Knox Saliva, PharmD, MPH Clinical Pharmacist (Rheumatology and Pulmonology)

## 2020-04-24 NOTE — Telephone Encounter (Signed)
Received notification from [facility] regarding a prior authorization for [place]. Authorization has been APPROVED from 04/23/20 to 04/23/21.   Authorization # [id number]

## 2020-07-11 ENCOUNTER — Other Ambulatory Visit: Payer: Self-pay | Admitting: Physician Assistant

## 2020-07-11 DIAGNOSIS — M0579 Rheumatoid arthritis with rheumatoid factor of multiple sites without organ or systems involvement: Secondary | ICD-10-CM

## 2020-07-11 DIAGNOSIS — Z9225 Personal history of immunosupression therapy: Secondary | ICD-10-CM

## 2020-07-11 DIAGNOSIS — Z79899 Other long term (current) drug therapy: Secondary | ICD-10-CM

## 2020-07-11 DIAGNOSIS — Z111 Encounter for screening for respiratory tuberculosis: Secondary | ICD-10-CM

## 2020-07-11 NOTE — Progress Notes (Deleted)
Office Visit Note  Patient: Dawn Meyer             Date of Birth: 12/14/1955           MRN: [id number]             PCP: Erven Colla, DO Referring: Erven Colla, DO Visit Date: 07/25/2020 Occupation: @GUAROCC @  Subjective:  No chief complaint on file.   History of Present Illness: Dawn Meyer is a 65 y.o. female ***   Activities of Daily Living:  Patient reports morning stiffness for *** {minute/hour:19697}.   Patient {ACTIONS;DENIES/REPORTS:21021675::"Denies"} nocturnal pain.  Difficulty dressing/grooming: {ACTIONS;DENIES/REPORTS:21021675::"Denies"} Difficulty climbing stairs: {ACTIONS;DENIES/REPORTS:21021675::"Denies"} Difficulty getting out of chair: {ACTIONS;DENIES/REPORTS:21021675::"Denies"} Difficulty using hands for taps, buttons, cutlery, and/or writing: {ACTIONS;DENIES/REPORTS:21021675::"Denies"}  No Rheumatology ROS completed.   PMFS History:  Patient Active Problem List   Diagnosis Date Noted  . Rheumatoid arthritis involving multiple sites with positive rheumatoid factor ([place]) 08/18/2016  . Swollen wrist, left 08/18/2016  . 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 Primary osteoarthritis of both feet 08/18/2016  . OP (osteoporosis) 08/18/2016  . Degenerative joint disease involving multiple joints 08/18/2016  . High risk medication use 08/18/2016    Past Medical History:  Diagnosis Date  . Asthma   . RA (rheumatoid arthritis) (HCC)     Family History  Problem Relation Age of Onset  . Congestive Heart Failure Mother   . Heart attack Mother   . Diabetes Father   . Heart Problems Father        pacemaker  . Diverticulitis Sister   . Hypertension Sister   . Heart attack Brother   . Healthy Daughter    Past Surgical History:  Procedure Laterality Date  . MOHS SURGERY    . VAGINAL DELIVERY     Social History   Social History Narrative  . Not on file   Immunization History  Administered Date(s) Administered  .  Influenza Inj Mdck Quad Pf 03/12/2017  . Influenza-Unspecified 03/20/2016, 03/11/2018, 03/13/2020  . Janssen (J&J) SARS-COV-2 Vaccination 08/31/2019  . Td 08/28/2009     Objective: Vital Signs: There were no vitals taken for this visit.   Physical Exam   Musculoskeletal Exam: ***  CDAI Exam: CDAI Score: -- Patient Global: --; Provider Global: -- Swollen: --; Tender: -- Joint Exam 07/25/2020   No joint exam has been documented for this visit   There is currently no information documented on the homunculus. Go to the Rheumatology activity and complete the homunculus joint exam.  Investigation: No additional findings.  Imaging: No results found.  Recent Labs: Lab Results  Component Value Date   WBC 6.7 03/13/2020   HGB 12.7 03/13/2020   PLT 165 03/13/2020   NA 144 03/13/2020   K 4.3 03/13/2020   CL 107 (H) 03/13/2020   CO2 22 03/13/2020   GLUCOSE 82 03/13/2020   BUN 12 03/13/2020   CREATININE 0.91 03/13/2020   BILITOT 0.4 03/13/2020   ALKPHOS 125 (H) 03/13/2020   AST 13 03/13/2020   ALT 14 03/13/2020   PROT 6.7 03/13/2020   ALBUMIN 3.9 03/13/2020   CALCIUM 9.1 03/13/2020   GFRAA 78 03/13/2020   QFTBGOLD Negative 07/29/2016   QFTBGOLDPLUS WILL FOLLOW 09/20/2019    Speciality Comments: Osteoporosis manged by Dr. Estanislado Pandy.  She declines treatment-ACY 01/11/2019  Procedures:  No procedures performed Allergies: Doxycycline   Assessment / Plan:     Visit Diagnoses: No diagnosis found.  Orders: No orders of the defined  types were placed in this encounter.  No orders of the defined types were placed in this encounter.   Face-to-face time spent with patient was *** minutes. Greater than 50% of time was spent in counseling and coordination of care.  Follow-Up Instructions: No follow-ups on file.   Earnestine Mealing, CMA  Note - This record has been created using Editor, commissioning.  Chart creation errors have been sought, but may not always  have been  located. Such creation errors do not reflect on  the standard of medical care.

## 2020-07-12 NOTE — Telephone Encounter (Signed)
I will give 85month supply of Humira.  Hopefully she can get labs next month.

## 2020-07-12 NOTE — Telephone Encounter (Signed)
Last Visit: 02/22/2020 Next Visit: 07/25/2020 Labs: 03/13/2020, Absolute lymphocyte is borderline elevated. WBC count is WNL. Rest of CBC WNL.  Alk phos is borderline elevated. We will continue to monitor closely.  TB Gold: 09/20/2019, not resulted, will have TB Gold ordered with next lab  Current Dose per office note 02/22/2020, Humira 40 mg every 21 days  [id number] arthritis involving multiple sites with positive rheumatoid factor   Last Fill: 04/11/2020  Okay to refill Humira?

## 2020-07-20 ENCOUNTER — Other Ambulatory Visit: Payer: Self-pay | Admitting: *Deleted

## 2020-07-20 DIAGNOSIS — Z111 Encounter for screening for respiratory tuberculosis: Secondary | ICD-10-CM

## 2020-07-20 DIAGNOSIS — Z79899 Other long term (current) drug therapy: Secondary | ICD-10-CM

## 2020-07-20 DIAGNOSIS — Z9225 Personal history of immunosupression therapy: Secondary | ICD-10-CM

## 2020-07-20 DIAGNOSIS — M0579 Rheumatoid arthritis with rheumatoid factor of multiple sites without organ or systems involvement: Secondary | ICD-10-CM

## 2020-07-25 ENCOUNTER — Ambulatory Visit: Payer: BC Managed Care – PPO | Admitting: Rheumatology

## 2020-07-25 DIAGNOSIS — Z79899 Other long term (current) drug therapy: Secondary | ICD-10-CM

## 2020-07-25 DIAGNOSIS — M19042 Primary osteoarthritis, left hand: Secondary | ICD-10-CM

## 2020-07-25 DIAGNOSIS — M0579 Rheumatoid arthritis with rheumatoid factor of multiple sites without organ or systems involvement: Secondary | ICD-10-CM

## 2020-07-25 DIAGNOSIS — M19072 Primary osteoarthritis, left ankle and foot: Secondary | ICD-10-CM

## 2020-07-25 DIAGNOSIS — Z8709 Personal history of other diseases of the respiratory system: Secondary | ICD-10-CM

## 2020-07-25 DIAGNOSIS — M810 Age-related osteoporosis without current pathological fracture: Secondary | ICD-10-CM

## 2020-08-09 NOTE — Progress Notes (Signed)
Absolute lymphocytes are borderline elevated. WBC count WNL.  CMP WNL.  We will continue to monitor lab work every 3 months.

## 2020-08-11 LAB — CBC WITH DIFFERENTIAL/PLATELET
Basophils Absolute: 0 10*3/uL (ref 0.0–0.2)
Basos: 0 %
EOS (ABSOLUTE): 0.5 10*3/uL — ABNORMAL HIGH (ref 0.0–0.4)
Eos: 6 %
Hematocrit: 39.5 % (ref 34.0–46.6)
Hemoglobin: 13.5 g/dL (ref 11.1–15.9)
Immature Grans (Abs): 0 10*3/uL (ref 0.0–0.1)
Immature Granulocytes: 0 %
Lymphocytes Absolute: 3.4 10*3/uL — ABNORMAL HIGH (ref 0.7–3.1)
Lymphs: 44 %
MCH: 30.5 pg (ref 26.6–33.0)
MCHC: 34.2 g/dL (ref 31.5–35.7)
MCV: 89 fL (ref 79–97)
Monocytes Absolute: 0.6 10*3/uL (ref 0.1–0.9)
Monocytes: 8 %
Neutrophils Absolute: 3.2 10*3/uL (ref 1.4–7.0)
Neutrophils: 42 %
Platelets: 180 10*3/uL (ref 150–450)
RBC: 4.42 x10E6/uL (ref 3.77–5.28)
RDW: 12.7 % (ref 11.7–15.4)
WBC: 7.7 10*3/uL (ref 3.4–10.8)

## 2020-08-11 LAB — QUANTIFERON-TB GOLD PLUS
QuantiFERON Mitogen Value: >10 IU/mL
QuantiFERON Nil Value: 0.39 IU/mL
QuantiFERON TB1 Ag Value: 0.43 IU/mL
QuantiFERON TB2 Ag Value: 0.57 IU/mL
QuantiFERON-TB Gold Plus: NEGATIVE

## 2020-08-11 LAB — CMP14+EGFR
ALT: 14 IU/L (ref 0–32)
AST: 16 IU/L (ref 0–40)
Albumin/Globulin Ratio: 1.8 (ref 1.2–2.2)
Albumin: 4.4 g/dL (ref 3.8–4.8)
Alkaline Phosphatase: 118 IU/L (ref 44–121)
BUN/Creatinine Ratio: 14 (ref 12–28)
BUN: 13 mg/dL (ref 8–27)
Bilirubin Total: 0.5 mg/dL (ref 0.0–1.2)
CO2: 21 mmol/L (ref 20–29)
Calcium: 9.4 mg/dL (ref 8.7–10.3)
Chloride: 104 mmol/L (ref 96–106)
Creatinine, Ser: 0.92 mg/dL (ref 0.57–1.00)
Globulin, Total: 2.4 g/dL (ref 1.5–4.5)
Glucose: 99 mg/dL (ref 65–99)
Potassium: 4 mmol/L (ref 3.5–5.2)
Sodium: 141 mmol/L (ref 134–144)
Total Protein: 6.8 g/dL (ref 6.0–8.5)
eGFR: 70 mL/min/{1.73_m2} (ref >59)

## 2020-08-13 NOTE — Progress Notes (Signed)
TB gold negative

## 2020-08-14 ENCOUNTER — Other Ambulatory Visit: Payer: Self-pay | Admitting: Family Medicine

## 2020-08-15 NOTE — Progress Notes (Signed)
Office Visit Note  Patient: Dawn Meyer             Date of Birth: 1955-12-30           MRN: [id number]             PCP: Erven Colla, DO Referring: Erven Colla, DO Visit Date: 08/28/2020 Occupation: @GUAROCC @  Subjective:  Pain in both feet   History of Present Illness: Dawn Meyer is a 65 y.o. female with history of seropositive rheumatoid arthritis, osteoarthritis, and osteoporosis.  She is on Humira 40 mg subcutaneous injections every 21 days.  She denies any recent rheumatoid arthritis flares.  She states that she has been experiencing increased pain in both feet and has scheduled appointment with Dr. Jacqualyn Posey at [facility] on 09/18/2020 for further evaluation.  She denies any swelling in her feet but she is concerned about previous damage and callus formation on the plantar aspect of her feet.  She continues to have ongoing pain and swelling in the right 2nd MCP joint.  She denies any other joint pain or joint swelling at this time.  She denies any recent infections. She has been under tremendous amount of stress her father passed away at the end of 07/13/20.  She and her husband will be moving into the family home so she has started to pack which has caused some increased pain in her hands.    Activities of Daily Living:  Patient reports morning stiffness for 0 minutes.   Patient Denies nocturnal pain.  Difficulty dressing/grooming: Denies Difficulty climbing stairs: Denies Difficulty getting out of chair: Denies Difficulty using hands for taps, buttons, cutlery, and/or writing: Denies  Review of Systems  Constitutional: Negative for fatigue.  HENT: Negative for mouth sores, mouth dryness and nose dryness.   Eyes: Negative for pain, visual disturbance and dryness.  Respiratory: Negative for cough, hemoptysis, shortness of breath and difficulty breathing.   Cardiovascular: Negative for chest pain, palpitations, hypertension and swelling in  legs/feet.  Gastrointestinal: Negative for blood in stool, constipation and diarrhea.  Endocrine: Negative for increased urination.  Genitourinary: Negative for painful urination.  Musculoskeletal: Negative for arthralgias, joint pain, joint swelling, myalgias, muscle weakness, morning stiffness, muscle tenderness and myalgias.  Skin: Negative for color change, pallor, rash, hair loss, nodules/bumps, skin tightness, ulcers and sensitivity to sunlight.  Allergic/Immunologic: Negative for susceptible to infections.  Neurological: Negative for dizziness, numbness, headaches and weakness.  Hematological: Negative for swollen glands.  Psychiatric/Behavioral: Negative for depressed mood and sleep disturbance. The patient is not nervous/anxious.     PMFS History:  Patient Active Problem List   Diagnosis Date Noted  . Rheumatoid arthritis involving multiple sites with positive rheumatoid factor ([place]) 08/18/2016  . Swollen wrist, left 08/18/2016  . 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 Primary osteoarthritis of both feet 08/18/2016  . OP (osteoporosis) 08/18/2016  . Degenerative joint disease involving multiple joints 08/18/2016  . High risk medication use 08/18/2016    Past Medical History:  Diagnosis Date  . Asthma   . RA (rheumatoid arthritis) (HCC)     Family History  Problem Relation Age of Onset  . Congestive Heart Failure Mother   . Heart attack Mother   . Diabetes Father   . Heart Problems Father        pacemaker  . Diverticulitis Sister   . Hypertension Sister   . Heart attack Brother   . Healthy Daughter  Past Surgical History:  Procedure Laterality Date  . MOHS SURGERY    . VAGINAL DELIVERY     Social History   Social History Narrative  . Not on file   Immunization History  Administered Date(s) Administered  . Influenza Inj Mdck Quad Pf 03/12/2017  . Influenza-Unspecified 03/20/2016, 03/11/2018, 03/13/2020  . Janssen (J&J) SARS-COV-2 Vaccination  08/31/2019  . Td 08/28/2009     Objective: Vital Signs: BP (!) 168/97 (BP Location: Left Arm, Patient Position: Sitting, Cuff Size: Normal)   Pulse 71   Resp 14   Ht $R'5\' 7"'cZ$  (1.702 m)   Wt 150 lb 12.8 oz (68.4 kg)   BMI 23.62 kg/m    Physical Exam Vitals and nursing note reviewed.  Constitutional:      Appearance: She is well-developed.  HENT:     Head: Normocephalic and atraumatic.  Eyes:     Conjunctiva/sclera: Conjunctivae normal.  Pulmonary:     Effort: Pulmonary effort is normal.  Abdominal:     Palpations: Abdomen is soft.  Musculoskeletal:     Cervical back: Normal range of motion.  Skin:    General: Skin is warm and dry.     Capillary Refill: Capillary refill takes less than 2 seconds.  Neurological:     Mental Status: She is alert and oriented to person, place, and time.  Psychiatric:        Behavior: Behavior normal.      Musculoskeletal Exam: C-spine, thoracic spine, and lumbar spine good ROM with no discomfort.  Shoulder joints, elbow joints, wrist joints, MCPs, PIPs, and DIPs good ROM.  Synovial thickening of the right 2nd MCP.  Complete fist formation bilaterally.  Hip joints and knee joints have good range of motion with no discomfort.  No warmth or effusion of knee joints noted.  No tenderness over MTP joints noted.  Some overcrowding of toes noted.  Callus formation on the plantar aspect of MTP joints.  Tenderness and warmth over the right ankle joint noted.   CDAI Exam: CDAI Score: 0.8  Patient Global: 4 mm; Provider Global: 4 mm Swollen: 0 ; Tender: 0  Joint Exam 08/28/2020   No joint exam has been documented for this visit   There is currently no information documented on the homunculus. Go to the Rheumatology activity and complete the homunculus joint exam.  Investigation: No additional findings.  Imaging: No results found.  Recent Labs: Lab Results  Component Value Date   WBC 7.7 08/08/2020   HGB 13.5 08/08/2020   PLT 180 08/08/2020    NA 141 08/08/2020   K 4.0 08/08/2020   CL 104 08/08/2020   CO2 21 08/08/2020   GLUCOSE 99 08/08/2020   BUN 13 08/08/2020   CREATININE 0.92 08/08/2020   BILITOT 0.5 08/08/2020   ALKPHOS 118 08/08/2020   AST 16 08/08/2020   ALT 14 08/08/2020   PROT 6.8 08/08/2020   ALBUMIN 4.4 08/08/2020   CALCIUM 9.4 08/08/2020   GFRAA 78 03/13/2020   QFTBGOLD Negative 07/29/2016   QFTBGOLDPLUS Negative 08/08/2020    Speciality Comments: Osteoporosis manged by Dr. Estanislado Pandy.  She declines treatment-ACY 01/11/2019  Procedures:  No procedures performed Allergies: Doxycycline   Assessment / Plan:     Visit Diagnoses: Rheumatoid arthritis involving multiple sites with positive rheumatoid factor ([place]): She has tenderness and synovitis over the right second MCP joint.  She has been experiencing increased pain in both feet for the past several months.  She has tenderness and warmth over the right  ankle joint on examination today.  She is currently on Humira 40 mg subcutaneous injections every 21 days, and she has been spacing the dose of Humira due to clinically doing well.  Due to new and worsening symptoms, we will try increasing the frequency of Humira injections back to every 14 days.  She was in agreement.  She was advised to notify us if she cannot tolerate the increased frequency of dosing or develops more frequent infections.  She will follow-up in the office in 3 months to assess her response to the increased frequency of humira.   High risk medication use - Humira 40 mg sq injections every 14 days. CBC and CMP were drawn on 08/08/2020.  She will be due to update lab work in June and every 3 months to monitor for drug toxicity.  Standing orders for CBC and CMP are in place.  TB gold negative on 08/08/2020 and will continue to be monitored yearly.  - Plan: CBC with Differential/Platelet, CMP14+EGFR, CMP14+EGFR She has not had any recent infections.  We discussed the importance of holding Humira if she  develops signs or symptoms of an infection and to resume once the infection has completely cleared.  She voiced understanding. Discussed the importance of yearly skin exams while on Humira due to the increased risk for skin cancer.  According to the patient her dermatologist recommends starting on Heliocare and niacinamide supplements for skin cancer prevention.    Primary osteoarthritis of both hands: She has PIP and DIP thickening consistent with osteoarthritis of both hands.  She has been experiencing increased pain and stiffness in both hands likely exacerbated from starting to prepare to move.  Discussed the importance of joint protection and muscle strengthening.   Primary osteoarthritis of both feet: She has PIP and DIP thickening consistent with osteoarthritic changes.  No tenderness over MTP joints noted.  She has good range of motion of both ankle joints with tenderness and warmth over the right ankle.  She has been experiencing increased pain in both feet over the past several months.  She has has overcrowding of toes as well as callus formation on the plantar aspect of her feet.  She has tried wearing toe spacers as well as Band-Aids on the plantar aspect of her feet but has not noticed much improvement in her symptoms.  She has an upcoming appointment at [facility] with Dr. Jacqualyn Posey for further evaluation and management.  Age-related osteoporosis without current pathological fracture - DEXA 01/19/14 left forearm at the 33% radius site, BMD 0.588 T-score -3.3.  History of asthma   Orders: Orders Placed This Encounter  Procedures  . CMP14+EGFR   No orders of the defined types were placed in this encounter.     Follow-Up Instructions: Return in about 3 months (around 11/27/2020) for Rheumatoid arthritis, Osteoarthritis, Osteoporosis.   Ofilia Neas, PA-C  Note - This record has been created using Dragon software.  Chart creation errors have been sought, but may not always   have been located. Such creation errors do not reflect on  the standard of medical care.

## 2020-08-28 ENCOUNTER — Encounter: Payer: Self-pay | Admitting: Physician Assistant

## 2020-08-28 ENCOUNTER — Ambulatory Visit: Payer: BC Managed Care – PPO | Admitting: Physician Assistant

## 2020-08-28 ENCOUNTER — Other Ambulatory Visit: Payer: Self-pay

## 2020-08-28 VITALS — BP 168/97 | HR 71 | Resp 14 | Ht 67.0 in | Wt 150.8 lb

## 2020-08-28 DIAGNOSIS — M810 Age-related osteoporosis without current pathological fracture: Secondary | ICD-10-CM

## 2020-08-28 DIAGNOSIS — M0579 Rheumatoid arthritis with rheumatoid factor of multiple sites without organ or systems involvement: Secondary | ICD-10-CM

## 2020-08-28 DIAGNOSIS — M19072 Primary osteoarthritis, left ankle and foot: Secondary | ICD-10-CM

## 2020-08-28 DIAGNOSIS — M19041 Primary osteoarthritis, right hand: Secondary | ICD-10-CM

## 2020-08-28 DIAGNOSIS — Z79899 Other long term (current) drug therapy: Secondary | ICD-10-CM

## 2020-08-28 DIAGNOSIS — M19071 Primary osteoarthritis, right ankle and foot: Secondary | ICD-10-CM

## 2020-08-28 DIAGNOSIS — Z8709 Personal history of other diseases of the respiratory system: Secondary | ICD-10-CM

## 2020-08-28 DIAGNOSIS — M19042 Primary osteoarthritis, left hand: Secondary | ICD-10-CM

## 2020-08-28 MED ORDER — HUMIRA (2 PEN) 40 MG/0.4ML ~~LOC~~ AJKT: 40.0000 mg | AUTO-INJECTOR | SUBCUTANEOUS | 0 refills | Status: DC

## 2020-08-28 NOTE — Patient Instructions (Signed)
Standing Labs We placed an order today for your standing lab work.   Please have your standing labs drawn in June and every 3 months   If possible, please have your labs drawn 2 weeks prior to your appointment so that the provider can discuss your results at your appointment.  We have open lab daily Monday through Thursday from 1:30-4:30 PM and Friday from 1:30-4:00 PM at the office of Dr. Shaili Deveshwar, [facility].   Please be advised, all patients with office appointments requiring lab work will take precedents over walk-in lab work.  If possible, please come for your lab work on Monday and Friday afternoons, as you may experience shorter wait times. The office is located at [address], [address], [place], [place] [address] No appointment is necessary.   Labs are drawn by [facility]. Please bring your co-pay at the time of your lab draw.  You may receive a bill from [facility] for your lab work.  If you wish to have your labs drawn at another location, please call the office 24 hours in advance to send orders.  If you have any questions regarding directions or hours of operation,  please call [phone].   As a reminder, please drink plenty of water prior to coming for your lab work. Thanks!   

## 2020-08-28 NOTE — Telephone Encounter (Signed)
Humira dose change you advised today at patient's appointment. Please review "no print" prescription and sign. Thanks!

## 2020-09-14 ENCOUNTER — Other Ambulatory Visit: Payer: Self-pay | Admitting: Rheumatology

## 2020-09-14 DIAGNOSIS — M0579 Rheumatoid arthritis with rheumatoid factor of multiple sites without organ or systems involvement: Secondary | ICD-10-CM

## 2020-09-14 NOTE — Telephone Encounter (Incomplete)
Next Visit: 12/05/2020  Last Visit: 08/28/2020  Last Fill: 08/28/2020  DX:  Rheumatoid arthritis involving multiple sites with positive rheumatoid factor   Current Dose per office note 08/28/2020, Humira 40 mg sq injections every 14 days  Labs: 08/08/2020, Absolute lymphocytes are borderline elevated. WBC count WNL. CMP WNL. We will continue to monitor lab work every 3 months  TB Gold: 08/08/2020, TB gold negative.  Okay to refill Humira?

## 2020-09-18 ENCOUNTER — Ambulatory Visit: Payer: BC Managed Care – PPO | Admitting: Podiatry

## 2020-09-19 ENCOUNTER — Encounter: Payer: Self-pay | Admitting: Family Medicine

## 2020-09-19 ENCOUNTER — Ambulatory Visit (INDEPENDENT_AMBULATORY_CARE_PROVIDER_SITE_OTHER): Payer: BC Managed Care – PPO | Admitting: Family Medicine

## 2020-09-19 ENCOUNTER — Other Ambulatory Visit: Payer: Self-pay

## 2020-09-19 VITALS — HR 69 | Temp 99.1°F | Resp 16 | Wt 148.4 lb

## 2020-09-19 DIAGNOSIS — H66001 Acute suppurative otitis media without spontaneous rupture of ear drum, right ear: Secondary | ICD-10-CM | POA: Diagnosis not present

## 2020-09-19 DIAGNOSIS — J45909 Unspecified asthma, uncomplicated: Secondary | ICD-10-CM | POA: Diagnosis not present

## 2020-09-19 MED ORDER — PREDNISONE 20 MG PO TABS
ORAL_TABLET | ORAL | 0 refills | Status: DC
Start: 2020-09-19 — End: 2020-11-30

## 2020-09-19 MED ORDER — AMOXICILLIN-POT CLAVULANATE 875-125 MG PO TABS: 1.0000 | ORAL_TABLET | Freq: Two times a day (BID) | ORAL | 0 refills | Status: DC

## 2020-09-19 NOTE — Progress Notes (Signed)
Patient ID: Dawn Meyer, female    DOB: [birth year], 65 y.o.   MRN: [id number]   Chief Complaint  Patient presents with  . Sinus Problem    Went to urgent care and got antibiotic an dit moved into chest and now has cough- finished antibiotic yesterday    Subjective:  CC: [facility] from [facility]  This is not a new problem.  Presents today for follow-up from [facility] clinic visit on April 20.  Reports that [facility], was diagnosed with otitis media and sinus infection, received Augmentin for 7 days.  Reports that she usually gets an antibiotic for full 10 days to get rid of her symptoms.  Patient is a current cigarette smoker, is on Humira for her rheumatoid arthritis.  Reports that she has been using her Flovent inhaler twice per day and albuterol daily at night.  Reports that wheezing has occurred, worse at night.  Has seasonal allergies.  Denies fever, chills.  Endorses congestion, sinus pain and pressure, cough and wheezing.  Wishes to have antibiotic for full 10 days and prednisone.  Had leftover prednisone, admits to self medicating with prednisone on Saturday, Sunday, Monday and Tuesday.    Medical History Dawn Meyer has a past medical history of Asthma and RA (rheumatoid arthritis) ([place]).   Outpatient Encounter Medications as of 09/19/2020  Medication Sig  . amoxicillin-clavulanate (AUGMENTIN) 875-125 MG tablet Take 1 tablet by mouth 2 (two) times daily.  . predniSONE (DELTASONE) 20 MG tablet Take 3 tablets by mouth for 3 days, then 2 tablets by  mouth for 3 days, then 1 tablet by mouth for 3 days.  . Adalimumab (HUMIRA PEN) 40 MG/0.4ML PNKT Inject 40 mg into the skin every 14 (fourteen) days.  Marland Kitchen albuterol (VENTOLIN HFA) 108 (90 Base) MCG/ACT inhaler INHALE 2 PUFFS BY MOUTH INTO THE LUNGS EVERY 6 HOURS AS NEEDED FOR WHEEZING OR SHORTNESS OF BREATH  . fexofenadine (ALLEGRA) 180 MG tablet Take 180 mg by mouth daily as needed for allergies or  rhinitis.  Marland Kitchen FLOVENT HFA 44 MCG/ACT inhaler INHALE TWO PUFFS INTO THE LUNGS TWO TIMES DAILY  . NIACINAMIDE PO Take by mouth daily.  . Polypodium Leucotomos (HELIOCARE PO) Take by mouth as needed.   No facility-administered encounter medications on file as of 09/19/2020.     Review of Systems  Constitutional: Negative for chills and fever.  HENT: Positive for congestion, sinus pressure and sinus pain. Negative for ear pain.   Respiratory: Positive for cough and wheezing. Negative for chest tightness and shortness of breath.   Cardiovascular: Negative for chest pain.  Gastrointestinal: Negative for abdominal pain.     Vitals Pulse 69   Temp 99.1 F (37.3 C)   Resp 16   Wt 148 lb 6.4 oz (67.3 kg)   SpO2 100%   BMI 23.24 kg/m   Objective:   Physical Exam Vitals reviewed.  Constitutional:      Appearance: Normal appearance.  HENT:     Right Ear: Tympanic membrane is erythematous.     Left Ear: Tympanic membrane normal.     Nose:     Right Sinus: No maxillary sinus tenderness or frontal sinus tenderness.     Left Sinus: No maxillary sinus tenderness or frontal sinus tenderness.  Cardiovascular:     Rate and Rhythm: Normal rate and regular rhythm.     Heart sounds: Normal heart sounds.  Pulmonary:     Effort: Pulmonary effort is  normal.     Breath sounds: Normal breath sounds. No wheezing or rhonchi.  Skin:    General: Skin is warm and dry.  Neurological:     General: No focal deficit present.     Mental Status: She is alert.  Psychiatric:        Behavior: Behavior normal.      Assessment and Plan   1. Acute suppurative otitis media of right ear without spontaneous rupture of tympanic membrane, recurrence not specified - amoxicillin-clavulanate (AUGMENTIN) 875-125 MG tablet; Take 1 tablet by mouth 2 (two) times daily.  Dispense: 6 tablet; Refill: 0  2. Asthma with bronchitis - predniSONE (DELTASONE) 20 MG tablet; Take 3 tablets by mouth for 3 days, then 2  tablets by  mouth for 3 days, then 1 tablet by mouth for 3 days.  Dispense: 18 tablet; Refill: 0    Will not take Humira dose until prednisone taper is complete. Recommend touching base with prescriber of humira to determine when that dose should be resumed safely.  She understands the importance of not taking Humira at the same time as a prednisone taper.  Warning given on steroid therapy.  Will complete 10-day course of Augmentin with 3 more days.  Right TM continues to be erythematous, denies any ear pain currently.  Lungs were clear upon auscultation, oxygen saturations 100%.  No shortness of breath, able to converse throughout visit without difficulty.  Agrees with plan of care discussed today. Understands warning signs to seek further care: chest pain, shortness of breath, any significant change in health.  Understands to follow-up if symptoms worsen, do not improve.  Recommend discussing Humira dosing with rheumatologist after prednisone taper is complete.  Reports she discussed this with rheumatologist yesterday.  Pecolia Ades, NP 09/19/20

## 2020-09-19 NOTE — Patient Instructions (Addendum)
Do not take Humira this weekend. Please contact prescribers of Humira and ask when dose should be resumed after completing prednisone taper.  Prednisone warning: Prednisone is an anti-inflammatory medication. It is best to only use it for short periods of time. It can cause  your blood pressure to increase and it can cause your blood sugar to increase. If you are a diabetic, please check your blood sugar twice per day while you are taking prednisone. If your sugars are > 200 or < 70 please seek medical attention right away.     Steps to Quit Smoking Smoking tobacco is the leading cause of preventable death. It can affect almost every organ in the body. Smoking puts you and people around you at risk for many serious, long-lasting (chronic) diseases. Quitting smoking can be hard, but it is one of the best things that you can do for your health. It is never too late to quit. How do I get ready to quit? When you decide to quit smoking, make a plan to help you succeed. Before you quit:  Pick a date to quit. Set a date within the next 2 weeks to give you time to prepare.  Write down the reasons why you are quitting. Keep this list in places where you will see it often.  Tell your family, friends, and co-workers that you are quitting. Their support is important.  Talk with your doctor about the choices that may help you quit.  Find out if your health insurance will pay for these treatments.  Know the people, places, things, and activities that make you want to smoke (triggers). Avoid them. What first steps can I take to quit smoking?  Throw away all cigarettes at home, at work, and in your car.  Throw away the things that you use when you smoke, such as ashtrays and lighters.  Clean your car. Make sure to empty the ashtray.  Clean your home, including curtains and carpets. What can I do to help me quit smoking? Talk with your doctor about taking medicines and seeing a counselor at the same  time. You are more likely to succeed when you do both.  If you are pregnant or breastfeeding, talk with your doctor about counseling or other ways to quit smoking. Do not take medicine to help you quit smoking unless your doctor tells you to do so. To quit smoking: Quit right away  Quit smoking totally, instead of slowly cutting back on how much you smoke over a period of time.  Go to counseling. You are more likely to quit if you go to counseling sessions regularly. Take medicine You may take medicines to help you quit. Some medicines need a prescription, and some you can buy over-the-counter. Some medicines may contain a drug called nicotine to replace the nicotine in cigarettes. Medicines may:  Help you to stop having the desire to smoke (cravings).  Help to stop the problems that come when you stop smoking (withdrawal symptoms). Your doctor may ask you to use:  Nicotine patches, gum, or lozenges.  Nicotine inhalers or sprays.  Non-nicotine medicine that is taken by mouth. Find resources Find resources and other ways to help you quit smoking and remain smoke-free after you quit. These resources are most helpful when you use them often. They include:  Online chats with a Social worker.  Phone quitlines.  Printed Furniture conservator/restorer.  Support groups or group counseling.  Text messaging programs.  Mobile phone apps. Use apps on your mobile  phone or tablet that can help you stick to your quit plan. There are many free apps for mobile phones and tablets as well as websites. Examples include Quit Guide from the [employer] and smokefree.gov   What things can I do to make it easier to quit?  Talk to your family and friends. Ask them to support and encourage you.  Call a phone quitline (1-800-QUIT-NOW), reach out to support groups, or work with a Social worker.  Ask people who smoke to not smoke around you.  Avoid places that make you want to smoke, such as: ? Bars. ? Parties. ? Smoke-break  areas at work.  Spend time with people who do not smoke.  Lower the stress in your life. Stress can make you want to smoke. Try these things to help your stress: ? Getting regular exercise. ? Doing deep-breathing exercises. ? Doing yoga. ? Meditating. ? Doing a body scan. To do this, close your eyes, focus on one area of your body at a time from head to toe. Notice which parts of your body are tense. Try to relax the muscles in those areas.   How will I feel when I quit smoking? Day 1 to 3 weeks Within the first 24 hours, you may start to have some problems that come from quitting tobacco. These problems are very bad 2-3 days after you quit, but they do not often last for more than 2-3 weeks. You may get these symptoms:  Mood swings.  Feeling restless, nervous, angry, or annoyed.  Trouble concentrating.  Dizziness.  Strong desire for high-sugar foods and nicotine.  Weight gain.  Trouble pooping (constipation).  Feeling like you may vomit (nausea).  Coughing or a sore throat.  Changes in how the medicines that you take for other issues work in your body.  Depression.  Trouble sleeping (insomnia). Week 3 and afterward After the first 2-3 weeks of quitting, you may start to notice more positive results, such as:  Better sense of smell and taste.  Less coughing and sore throat.  Slower heart rate.  Lower blood pressure.  Clearer skin.  Better breathing.  Fewer sick days. Quitting smoking can be hard. Do not give up if you fail the first time. Some people need to try a few times before they succeed. Do your best to stick to your quit plan, and talk with your doctor if you have any questions or concerns. Summary  Smoking tobacco is the leading cause of preventable death. Quitting smoking can be hard, but it is one of the best things that you can do for your health.  When you decide to quit smoking, make a plan to help you succeed.  Quit smoking right away, not  slowly over a period of time.  When you start quitting, seek help from your doctor, family, or friends. This information is not intended to replace advice given to you by your health care provider. Make sure you discuss any questions you have with your health care provider. Document Revised: 02/04/2019 Document Reviewed: 07/31/2018 Elsevier Patient Education  2021 [place]. How to Perform a Sinus Rinse A sinus rinse is a home treatment. It rinses your sinuses with a mixture of salt and water (saline solution). Sinuses are air-filled spaces in your skull behind the bones of your face and forehead. They open into your nasal cavity. A sinus rinse can help to clear your nasal cavity. It can clear mucus, dirt, dust, or pollen. You may do a sinus rinse when  you have:  A cold.  A virus.  Allergies.  A sinus infection.  A stuffy nose. Talk with your doctor about whether a sinus rinse might help you. What are the risks? A sinus rinse is normally very safe and helpful. However, there are a few risks. These include:  A burning feeling in the sinuses. This may happen if you do not make the saline solution as instructed. Be sure to follow all directions when making the saline solution.  Nasal irritation.  Infection from unclean water. This is rare, but possible. Do not do a sinus rinse if you have had:  Ear or nasal surgery.  An ear infection.  Blocked ears. Supplies needed:  Saline solution or powder.  Distilled or germ-free (sterile) water may be needed to mix with saline powder. ? You may use boiled and cooled tap water. Boil tap water for 5 minutes; cool until it is lukewarm. Use within 24 hours. ? Do not use regular tap water to mix with the saline solution.  Neti pot or nasal rinse bottle. This releases the saline solution into your nose and through your sinuses. You can buy neti pots and rinse bottles: ? At your local pharmacy. ? At a health food store. ? Online. How to  perform a sinus rinse 1. Wash your hands with soap and water. 2. Wash your device using the directions that came with it. 3. Dry your device. 4. Use the solution that comes with your device or one that is sold separately in stores. Follow the mixing directions on the package if you need to mix with sterile or distilled water. 5. Fill your device with the amount of saline solution stated in the device instructions. 6. Stand over a sink and tilt your head sideways over the sink. 7. Place the spout of the device in your upper nostril (the one closer to the ceiling). 8. Gently pour or squeeze the saline solution into your nasal cavity. The liquid should drain to your lower nostril if you are not too stuffed up (congested). 9. While rinsing, breathe through your open mouth. 10. Gently blow your nose to clear any mucus and rinse solution. Blowing too hard may cause ear pain. 11. Repeat in your other nostril. 12. Clean and rinse your device with clean water. 13. Air-dry your device. Talk with your doctor or pharmacist if you have questions about how to do a sinus rinse.   Summary  A sinus rinse is a home treatment. It rinses your sinuses with a mixture of salt and water (saline solution).  A sinus rinse is normally very safe and helpful. Follow all instructions carefully.  Talk with your doctor about whether a sinus rinse might help you. This information is not intended to replace advice given to you by your health care provider. Make sure you discuss any questions you have with your health care provider. Document Revised: 02/21/2020 Document Reviewed: 02/21/2020 Elsevier Patient Education  2021 [employer].

## 2020-09-24 ENCOUNTER — Encounter: Payer: Self-pay | Admitting: Podiatry

## 2020-09-24 ENCOUNTER — Other Ambulatory Visit: Payer: Self-pay

## 2020-09-24 ENCOUNTER — Ambulatory Visit: Payer: BC Managed Care – PPO | Admitting: Podiatry

## 2020-09-24 DIAGNOSIS — M2062 Acquired deformities of toe(s), unspecified, left foot: Secondary | ICD-10-CM | POA: Diagnosis not present

## 2020-09-24 DIAGNOSIS — M21611 Bunion of right foot: Secondary | ICD-10-CM | POA: Diagnosis not present

## 2020-09-24 DIAGNOSIS — Q828 Other specified congenital malformations of skin: Secondary | ICD-10-CM

## 2020-09-24 DIAGNOSIS — M79672 Pain in left foot: Secondary | ICD-10-CM

## 2020-09-24 DIAGNOSIS — M216X1 Other acquired deformities of right foot: Secondary | ICD-10-CM

## 2020-09-24 DIAGNOSIS — M79671 Pain in right foot: Secondary | ICD-10-CM

## 2020-09-26 NOTE — Progress Notes (Signed)
Subjective:   Patient ID: Dawn Meyer, female   DOB: 65 y.o.   MRN: [id number]   HPI 65 year old female presents the office with concerns of bilateral foot pain.  She gets callus lesions with her right side worse than left causing discomfort.  She has previously tried custom orthotics which did not help.  She is made an over-the-counter insert to offload which seems to do better.  She denies any open lesions.  She does have a history of rheumatoid arthritis.   Review of Systems  All other systems reviewed and are negative.  Past Medical History:  Diagnosis Date  . Asthma   . RA (rheumatoid arthritis) ([place])     Past Surgical History:  Procedure Laterality Date  . MOHS SURGERY    . VAGINAL DELIVERY       Current Outpatient Medications:  .  Adalimumab (HUMIRA PEN) 40 MG/0.4ML PNKT, Inject 40 mg into the skin every 14 (fourteen) days., Disp: 6 each, Rfl: 0 .  albuterol (VENTOLIN HFA) 108 (90 Base) MCG/ACT inhaler, INHALE 2 PUFFS BY MOUTH INTO THE LUNGS EVERY 6 HOURS AS NEEDED FOR WHEEZING OR SHORTNESS OF BREATH, Disp: 6.7 g, Rfl: 5 .  amoxicillin-clavulanate (AUGMENTIN) 875-125 MG tablet, Take 1 tablet by mouth 2 (two) times daily., Disp: 6 tablet, Rfl: 0 .  fexofenadine (ALLEGRA) 180 MG tablet, Take 180 mg by mouth daily as needed for allergies or rhinitis., Disp: , Rfl:  .  FLOVENT HFA 44 MCG/ACT inhaler, INHALE TWO PUFFS INTO THE LUNGS TWO TIMES DAILY, Disp: 10.6 g, Rfl: 5 .  NIACINAMIDE PO, Take by mouth daily., Disp: , Rfl:  .  Polypodium Leucotomos (HELIOCARE PO), Take by mouth as needed., Disp: , Rfl:  .  predniSONE (DELTASONE) 20 MG tablet, Take 3 tablets by mouth for 3 days, then 2 tablets by  mouth for 3 days, then 1 tablet by mouth for 3 days., Disp: 18 tablet, Rfl: 0  Allergies  Allergen Reactions  . Doxycycline Rash         Objective:  Physical Exam  General: AAO x3, NAD  Dermatological: Hyperkeratotic lesion submetatarsal 3 right foot as well as left  plantar hallux.  There is no underlying ulceration drainage or any signs of infection.  Minimal hyperkeratotic tissue submetatarsal 5 left foot again without any underlying ulceration.  There is no open lesions today.  Vascular: Dorsalis Pedis artery and Posterior Tibial artery pedal pulses are 2/4 bilateral with immedate capillary fill time. There is no pain with calf compression, swelling, warmth, erythema.   Neruologic: Grossly intact via light touch bilateral.   Musculoskeletal: Bunion deformities present on the right side.  Prominent metatarsal heads plantarly to the fat pad.  Hallux extensus present on the left side.  muscular strength 5/5 in all groups tested bilateral.  Gait: Unassisted, Nonantalgic.       Assessment:   Hyperkeratotic lesions due to probably metatarsal heads, digital deformity     Plan:  -Treatment options discussed including all alternatives, risks, and complications -Etiology of symptoms were discussed -This time I sharply debrided the hyperkeratotic lesions without any complications or bleeding x3.  There is no underlying ulceration.  Recommend moisturizer and offloading daily.  Dispensed various offloading pads.  She also has an over-the-counter insert that is been helpful.  Would hold off on custom orthotics at this point as she is tried this previously without any relief.  Trula Slade DPM

## 2020-10-16 ENCOUNTER — Other Ambulatory Visit: Payer: Self-pay | Admitting: Family Medicine

## 2020-10-16 DIAGNOSIS — J449 Chronic obstructive pulmonary disease, unspecified: Secondary | ICD-10-CM

## 2020-10-16 NOTE — Telephone Encounter (Signed)
Seen 09/19/20 for asthma

## 2020-11-21 NOTE — Progress Notes (Deleted)
Office Visit Note  Patient: Dawn Meyer             Date of Birth: [birth year]           MRN: [id number]             PCP: Erven Colla, DO Referring: Erven Colla, DO Visit Date: 12/05/2020 Occupation: @GUAROCC @  Subjective:  No chief complaint on file.   History of Present Illness: Dawn Meyer is a 65 y.o. female ***   Activities of Daily Living:  Patient reports morning stiffness for *** {minute/hour:19697}.   Patient {ACTIONS;DENIES/REPORTS:21021675::"Denies"} nocturnal pain.  Difficulty dressing/grooming: {ACTIONS;DENIES/REPORTS:21021675::"Denies"} Difficulty climbing stairs: {ACTIONS;DENIES/REPORTS:21021675::"Denies"} Difficulty getting out of chair: {ACTIONS;DENIES/REPORTS:21021675::"Denies"} Difficulty using hands for taps, buttons, cutlery, and/or writing: {ACTIONS;DENIES/REPORTS:21021675::"Denies"}  No Rheumatology ROS completed.   PMFS History:  Patient Active Problem List   Diagnosis Date Noted  . Acute suppurative otitis media of right ear without spontaneous rupture of tympanic membrane 09/19/2020  . Asthma with bronchitis 09/19/2020  . Allergic rhinitis 11/17/2017  . Deviated nasal septum 11/17/2017  . Recurrent sinusitis 11/17/2017  . Smokes 11/17/2017  . Rheumatoid arthritis involving multiple sites with positive rheumatoid factor ([facility]) 08/18/2016  . Swollen wrist, left 08/18/2016  . 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 Primary osteoarthritis of both feet 08/18/2016  . OP (osteoporosis) 08/18/2016  . Degenerative joint disease involving multiple joints 08/18/2016  . High risk medication use 08/18/2016    Past Medical History:  Diagnosis Date  . Asthma   . RA (rheumatoid arthritis) ([facility])     Family History  Problem Relation Age of Onset  . Congestive Heart Failure Mother   . Heart attack Mother   . Diabetes Father   . Heart Problems Father        pacemaker  . Diverticulitis Sister   . Hypertension Sister   .  Heart attack Brother   . Healthy Daughter    Past Surgical History:  Procedure Laterality Date  . MOHS SURGERY    . VAGINAL DELIVERY     Social History   Social History Narrative  . Not on file   Immunization History  Administered Date(s) Administered  . Influenza Inj Mdck Quad Pf 03/12/2017  . Influenza-Unspecified 03/20/2016, 03/11/2018, 03/13/2020  . Janssen (J&J) SARS-COV-2 Vaccination 08/31/2019  . Td 08/28/2009     Objective: Vital Signs: There were no vitals taken for this visit.   Physical Exam   Musculoskeletal Exam: ***  CDAI Exam: CDAI Score: -- Patient Global: --; Provider Global: -- Swollen: --; Tender: -- Joint Exam 12/05/2020   No joint exam has been documented for this visit   There is currently no information documented on the homunculus. Go to the Rheumatology activity and complete the homunculus joint exam.  Investigation: No additional findings.  Imaging: No results found.  Recent Labs: Lab Results  Component Value Date   WBC 7.7 08/08/2020   HGB 13.5 08/08/2020   PLT 180 08/08/2020   NA 141 08/08/2020   K 4.0 08/08/2020   CL 104 08/08/2020   CO2 21 08/08/2020   GLUCOSE 99 08/08/2020   BUN 13 08/08/2020   CREATININE 0.92 08/08/2020   BILITOT 0.5 08/08/2020   ALKPHOS 118 08/08/2020   AST 16 08/08/2020   ALT 14 08/08/2020   PROT 6.8 08/08/2020   ALBUMIN 4.4 08/08/2020   CALCIUM 9.4 08/08/2020   GFRAA 78 03/13/2020   QFTBGOLD Negative 07/29/2016   QFTBGOLDPLUS Negative 08/08/2020    Speciality Comments:  Osteoporosis manged by Dr. Estanislado Pandy.  She declines treatment-ACY 01/11/2019  Procedures:  No procedures performed Allergies: Doxycycline   Assessment / Plan:     Visit Diagnoses: No diagnosis found.  Orders: No orders of the defined types were placed in this encounter.  No orders of the defined types were placed in this encounter.   Face-to-face time spent with patient was *** minutes. Greater than 50% of time was  spent in counseling and coordination of care.  Follow-Up Instructions: No follow-ups on file.   Earnestine Mealing, CMA  Note - This record has been created using Editor, commissioning.  Chart creation errors have been sought, but may not always  have been located. Such creation errors do not reflect on  the standard of medical care.

## 2020-11-30 ENCOUNTER — Ambulatory Visit
Admission: RE | Admit: 2020-11-30 | Discharge: 2020-11-30 | Disposition: A | Discharge status: Home / Self Care | Payer: BC Managed Care – PPO | Source: Ambulatory Visit | Attending: Emergency Medicine | Admitting: Emergency Medicine

## 2020-11-30 ENCOUNTER — Telehealth: Payer: Self-pay | Admitting: Family Medicine

## 2020-11-30 VITALS — BP 175/82 | HR 60 | Temp 98.4°F | Resp 18

## 2020-11-30 DIAGNOSIS — J069 Acute upper respiratory infection, unspecified: Secondary | ICD-10-CM | POA: Diagnosis not present

## 2020-11-30 DIAGNOSIS — R062 Wheezing: Secondary | ICD-10-CM

## 2020-11-30 MED ORDER — PREDNISONE 10 MG (21) PO TBPK: ORAL_TABLET | Freq: Every day | ORAL | 0 refills | Status: DC

## 2020-11-30 NOTE — ED Triage Notes (Signed)
Pt presents with c/o nasal congestion cough and wheezing that began yesterday tested positive today has RA requesting antiviral

## 2020-11-30 NOTE — Discharge Instructions (Addendum)
You should remain isolated in your home for 5 days from symptom onset AND greater than 72 hours after symptoms resolution (absence of fever without the use of fever-reducing medication and improvement in respiratory symptoms), whichever is longer Get plenty of rest and push fluids Prednisone prescribed.   Use OTC zyrtec for nasal congestion, runny nose, and/or sore throat Use OTC flonase for nasal congestion and runny nose Use medications daily for symptom relief Use OTC medications like ibuprofen or tylenol as needed fever or pain Call or go to the ED if you have any new or worsening symptoms such as fever, worsening cough, shortness of breath, chest tightness, chest pain, turning blue, changes in mental status, etc..Marland Kitchen

## 2020-11-30 NOTE — Telephone Encounter (Signed)
FYI - Called pt and advised her we had no openings today and she should go to urgent care since she is wheezing. Pt said she was very upset and disappointed that nothing could be called in for her. I told her with wheezing she should have a provider listen to her lungs and it would not be good practice to just call in meds without being evualated.

## 2020-11-30 NOTE — ED Provider Notes (Signed)
[facility]   [id number] 11/30/20 Arrival Time: 2025   CC: COVID symptoms  SUBJECTIVE: History from: patient.  Dawn Meyer is a 65 y.o. female who presents with nasal congestion, wheezing that began one day ago.  Husband with covid.  Denies alleviating or aggravating factors.  Reports/ denies previous symptoms in the past.   Denies fever, chills, fatigue, sinus pain, rhinorrhea, sore throat, SOB, wheezing, chest pain, nausea, changes in bowel or bladder habits.    Request antiviral  ROS: As per HPI.  All other pertinent ROS negative.     Past Medical History:  Diagnosis Date   Asthma    RA (rheumatoid arthritis) ([facility])    Past Surgical History:  Procedure Laterality Date   MOHS SURGERY     VAGINAL DELIVERY     Allergies  Allergen Reactions   Doxycycline Rash   No current facility-administered medications on file prior to encounter.   Current Outpatient Medications on File Prior to Encounter  Medication Sig Dispense Refill   Adalimumab (HUMIRA PEN) 40 MG/0.4ML PNKT Inject 40 mg into the skin every 14 (fourteen) days. 6 each 0   albuterol (VENTOLIN HFA) 108 (90 Base) MCG/ACT inhaler INHALE TWO PUFFS INTO THE LUNGS EVERY SIX HOURS AS NEEDED FOR WHEEZING OR SHORTNESS OF BREATH 8.5 g 5   amoxicillin-clavulanate (AUGMENTIN) 875-125 MG tablet Take 1 tablet by mouth 2 (two) times daily. 6 tablet 0   fexofenadine (ALLEGRA) 180 MG tablet Take 180 mg by mouth daily as needed for allergies or rhinitis.     FLOVENT HFA 44 MCG/ACT inhaler INHALE TWO PUFFS INTO THE LUNGS TWO TIMES DAILY 10.6 g 5   NIACINAMIDE PO Take by mouth daily.     Polypodium Leucotomos (HELIOCARE PO) Take by mouth as needed.     Social History   Socioeconomic History   Marital status: Married    Spouse name: Not on file   Number of children: Not on file   Years of education: Not on file   Highest education level: Not on file  Occupational History   Not on file  Tobacco Use   Smoking  status: Every Day    Packs/day: 0.25    Years: 40.00    Pack years: 10.00    Types: Cigarettes   Smokeless tobacco: Never  Vaping Use   Vaping Use: Never used  Substance and Sexual Activity   Alcohol use: No   Drug use: No   Sexual activity: Not on file  Other Topics Concern   Not on file  Social History Narrative   Not on file   Social Determinants of Health   Financial Resource Strain: Not on file  Food Insecurity: Not on file  Transportation Needs: Not on file  Physical Activity: Not on file  Stress: Not on file  Social Connections: Not on file  Intimate Partner Violence: Not on file   Family History  Problem Relation Age of Onset   Congestive Heart Failure Mother    Heart attack Mother    Diabetes Father    Heart Problems Father        pacemaker   Diverticulitis Sister    Hypertension Sister    Heart attack Brother    Healthy Daughter     OBJECTIVE:  Vitals:   11/30/20 1714  BP: (!) 175/82  Pulse: 60  Resp: 18  Temp: 98.4 F (36.9 C)  SpO2: 98%     General appearance: alert; appears fatigued, but nontoxic; speaking in full  sentences and tolerating own secretions HEENT: NCAT; Ears: EACs clear, TMs pearly gray; Eyes: PERRL.  EOM grossly intact.  Nose: nares patent without rhinorrhea, Throat: oropharynx clear, tonsils non erythematous or enlarged, uvula midline  Neck: supple without LAD Lungs: unlabored respirations, symmetrical air entry; cough: absent; no respiratory distress; subtle wheezing Heart: regular rate and rhythm.   Skin: warm and dry Psychological: alert and cooperative; normal mood and affect  ASSESSMENT & PLAN:  1. Viral URI with cough   2. Wheezing     Meds ordered this encounter  Medications   predniSONE (STERAPRED UNI-PAK 21 TAB) 10 MG (21) TBPK tablet    Sig: Take by mouth daily. Take 6 tabs by mouth daily  for 2 days, then 5 tabs for 2 days, then 4 tabs for 2 days, then 3 tabs for 2 days, 2 tabs for 2 days, then 1 tab by mouth  daily for 2 days    Dispense:  42 tablet    Refill:  0    Order Specific Question:   Supervising Provider    Answer:   Raylene Everts [[id number]]    You should remain isolated in your home for 5 days from symptom onset AND greater than 72 hours after symptoms resolution (absence of fever without the use of fever-reducing medication and improvement in respiratory symptoms), whichever is longer Get plenty of rest and push fluids Prednisone prescribed.   Use OTC zyrtec for nasal congestion, runny nose, and/or sore throat Use OTC flonase for nasal congestion and runny nose Use medications daily for symptom relief Use OTC medications like ibuprofen or tylenol as needed fever or pain Call or go to the ED if you have any new or worsening symptoms such as fever, worsening cough, shortness of breath, chest tightness, chest pain, turning blue, changes in mental status, etc...   Reviewed expectations re: course of current medical issues. Questions answered. Outlined signs and symptoms indicating need for more acute intervention. Patient verbalized understanding. After Visit Summary given.          Lestine Box, PA-C 11/30/20 1744

## 2020-11-30 NOTE — Telephone Encounter (Signed)
Patient called in states her husband had an appointment on Tuesday after he was dx with Covid. She now has Covid and had some wheezing over night. Would like to discuss with someone.

## 2020-12-05 ENCOUNTER — Ambulatory Visit: Payer: BC Managed Care – PPO | Admitting: Rheumatology

## 2020-12-12 ENCOUNTER — Ambulatory Visit: Payer: BC Managed Care – PPO | Admitting: Family Medicine

## 2020-12-14 ENCOUNTER — Other Ambulatory Visit: Payer: Self-pay

## 2020-12-14 ENCOUNTER — Ambulatory Visit (INDEPENDENT_AMBULATORY_CARE_PROVIDER_SITE_OTHER): Payer: BC Managed Care – PPO | Admitting: Family Medicine

## 2020-12-14 VITALS — BP 160/87 | HR 71 | Temp 98.2°F | Ht 67.0 in | Wt 147.0 lb

## 2020-12-14 DIAGNOSIS — Z8616 Personal history of COVID-19: Secondary | ICD-10-CM

## 2020-12-14 DIAGNOSIS — Z13220 Encounter for screening for lipoid disorders: Secondary | ICD-10-CM | POA: Diagnosis not present

## 2020-12-14 DIAGNOSIS — J449 Chronic obstructive pulmonary disease, unspecified: Secondary | ICD-10-CM

## 2020-12-14 DIAGNOSIS — I10 Essential (primary) hypertension: Secondary | ICD-10-CM | POA: Diagnosis not present

## 2020-12-14 MED ORDER — FLUTICASONE PROPIONATE HFA 44 MCG/ACT IN AERO: INHALATION_SPRAY | RESPIRATORY_TRACT | 2 refills | Status: DC

## 2020-12-14 MED ORDER — ALBUTEROL SULFATE HFA 108 (90 BASE) MCG/ACT IN AERS: INHALATION_SPRAY | RESPIRATORY_TRACT | 2 refills | Status: DC

## 2020-12-14 NOTE — Progress Notes (Signed)
Patient ID: Dawn Meyer, female    DOB: Jan 16, 1956, 65 y.o.   MRN: [id number]   Chief Complaint  Patient presents with   recent covid infection    2 to 3 weeks ago , check up     Subjective:    HPI  Pt follow up for recent covid infection and recheck breathing. Dx early July with covid and feeling better now. Pt doing better after covid medication. Husband had covid also. Pt didn't take paxlovid. On prednisone and flovent. Albuterol prn. In humidity using inhaler more in summer. Thick sputum when coughing occasionally. Denies fever, or other uri symptoms.  Medical History Chrys has a past medical history of Asthma and RA (rheumatoid arthritis) (HCC).   Outpatient Encounter Medications as of 12/14/2020  Medication Sig   Adalimumab (HUMIRA PEN) 40 MG/0.4ML PNKT Inject 40 mg into the skin every 14 (fourteen) days.   fexofenadine (ALLEGRA) 180 MG tablet Take 180 mg by mouth daily as needed for allergies or rhinitis.   [DISCONTINUED] albuterol (VENTOLIN HFA) 108 (90 Base) MCG/ACT inhaler INHALE TWO PUFFS INTO THE LUNGS EVERY SIX HOURS AS NEEDED FOR WHEEZING OR SHORTNESS OF BREATH   [DISCONTINUED] FLOVENT HFA 44 MCG/ACT inhaler INHALE TWO PUFFS INTO THE LUNGS TWO TIMES DAILY   albuterol (VENTOLIN HFA) 108 (90 Base) MCG/ACT inhaler INHALE TWO PUFFS INTO THE LUNGS EVERY SIX HOURS AS NEEDED FOR WHEEZING OR SHORTNESS OF BREATH   fluticasone (FLOVENT HFA) 44 MCG/ACT inhaler INHALE TWO PUFFS INTO THE LUNGS TWO TIMES DAILY   NIACINAMIDE PO Take by mouth daily.   Polypodium Leucotomos (HELIOCARE PO) Take by mouth as needed.   [DISCONTINUED] amoxicillin-clavulanate (AUGMENTIN) 875-125 MG tablet Take 1 tablet by mouth 2 (two) times daily. (Patient not taking: Reported on 12/14/2020)   [DISCONTINUED] predniSONE (STERAPRED UNI-PAK 21 TAB) 10 MG (21) TBPK tablet Take by mouth daily. Take 6 tabs by mouth daily  for 2 days, then 5 tabs for 2 days, then 4 tabs for 2 days, then 3 tabs for 2  days, 2 tabs for 2 days, then 1 tab by mouth daily for 2 days (Patient not taking: Reported on 12/14/2020)   No facility-administered encounter medications on file as of 12/14/2020.     Review of Systems  Constitutional:  Negative for chills and fever.  HENT:  Negative for congestion, rhinorrhea and sore throat.   Respiratory:  Positive for cough (intermittent). Negative for shortness of breath and wheezing.   Cardiovascular:  Negative for chest pain and leg swelling.  Gastrointestinal:  Negative for abdominal pain, diarrhea, nausea and vomiting.  Genitourinary:  Negative for dysuria and frequency.  Musculoskeletal:  Negative for arthralgias and back pain.  Skin:  Negative for rash.  Neurological:  Negative for dizziness, weakness and headaches.    Vitals BP (!) 160/87   Pulse 71   Temp 98.2 F (36.8 C)   Ht 5\' 7"  (1.702 m)   Wt 147 lb (66.7 kg)   SpO2 100%   BMI 23.02 kg/m   Objective:   Physical Exam Vitals and nursing note reviewed.  Constitutional:      Appearance: Normal appearance.  HENT:     Head: Normocephalic and atraumatic.  Eyes:     Extraocular Movements: Extraocular movements intact.     Conjunctiva/sclera: Conjunctivae normal.     Pupils: Pupils are equal, round, and reactive to light.  Cardiovascular:     Rate and Rhythm: Normal rate and regular rhythm.     Pulses: Normal pulses.  Heart sounds: Normal heart sounds.  Pulmonary:     Effort: Pulmonary effort is normal.     Breath sounds: Normal breath sounds. No wheezing, rhonchi or rales.  Musculoskeletal:        General: Normal range of motion.     Right lower leg: No edema.     Left lower leg: No edema.  Skin:    General: Skin is warm and dry.     Findings: No lesion or rash.  Neurological:     General: No focal deficit present.     Mental Status: She is alert and oriented to person, place, and time.  Psychiatric:        Mood and Affect: Mood normal.        Behavior: Behavior normal.      Assessment and Plan   1. Chronic obstructive pulmonary disease, unspecified COPD type (HCC) - albuterol (VENTOLIN HFA) 108 (90 Base) MCG/ACT inhaler; INHALE TWO PUFFS INTO THE LUNGS EVERY SIX HOURS AS NEEDED FOR WHEEZING OR SHORTNESS OF BREATH  Dispense: 8.5 g; Refill: 2  2. Lipid screening - Lipid panel  3. Hypertension, unspecified type - CBC - CMP14+EGFR - Lipid panel  4. History of COVID-19   Pt on prednisone recently- may caused slight inc in bp. Discussed needing to dec smoking. Pt declining to start a bp meds.  Discussed has had elevated bp for last few years. Discussed risk vs benefit of taking bp med to dec heart disease, stroke, and end organ damage. Pt voiced understanding and will follow up for recheck on next visit.  Pt given lab order to take when she gets labs for rheumatologist.  Htn- uncontrolled.  Pt currently on prednisone. Declining bp med at this time. Wanting to work on dec salt.  Reviewed with pt last 3 times had elevated bp, pt is aware and declining med at this time.  Return in about 6 months (around 06/16/2021) for f/u htn, copd.  BP Readings from Last 3 Encounters: 12/14/20 (!) 160/87 11/30/20 (!) 175/82 08/28/20 (!) 168/97

## 2020-12-18 NOTE — Progress Notes (Deleted)
Office Visit Note  Patient: Dawn Meyer             Date of Birth: 02-06-1956           MRN: [id number]             PCP: Erven Colla, DO Referring: Erven Colla, DO Visit Date: 01/01/2021 Occupation: '@GUAROCC'$ @  Subjective:  No chief complaint on file.   History of Present Illness: Dawn Meyer is a 65 y.o. female ***   Activities of Daily Living:  Patient reports morning stiffness for *** {minute/hour:19697}.   Patient {ACTIONS;DENIES/REPORTS:21021675::"Denies"} nocturnal pain.  Difficulty dressing/grooming: {ACTIONS;DENIES/REPORTS:21021675::"Denies"} Difficulty climbing stairs: {ACTIONS;DENIES/REPORTS:21021675::"Denies"} Difficulty getting out of chair: {ACTIONS;DENIES/REPORTS:21021675::"Denies"} Difficulty using hands for taps, buttons, cutlery, and/or writing: {ACTIONS;DENIES/REPORTS:21021675::"Denies"}  No Rheumatology ROS completed.   PMFS History:  Patient Active Problem List   Diagnosis Date Noted   Acute suppurative otitis media of right ear without spontaneous rupture of tympanic membrane 09/19/2020   Asthma with bronchitis 09/19/2020   Allergic rhinitis 11/17/2017   Deviated nasal septum 11/17/2017   Recurrent sinusitis 11/17/2017   Smokes 11/17/2017   Rheumatoid arthritis involving multiple sites with positive rheumatoid factor ([place]) 08/18/2016   Swollen wrist, left 08/18/2016   Primary osteoarthritis of both hands 08/18/2016   Primary osteoarthritis of both feet 08/18/2016   OP (osteoporosis) 08/18/2016   Degenerative joint disease involving multiple joints 08/18/2016   High risk medication use 08/18/2016    Past Medical History:  Diagnosis Date   Asthma    RA (rheumatoid arthritis) ([place])     Family History  Problem Relation Age of Onset   Congestive Heart Failure Mother    Heart attack Mother    Diabetes Father    Heart Problems Father        pacemaker   Diverticulitis Sister    Hypertension Sister    Heart attack Brother     Healthy Daughter    Past Surgical History:  Procedure Laterality Date   MOHS SURGERY     VAGINAL DELIVERY     Social History   Social History Narrative   Not on file   Immunization History  Administered Date(s) Administered   Influenza Inj Mdck Quad Pf 03/12/2017   Influenza-Unspecified 03/20/2016, 03/11/2018, 03/13/2020   Janssen (J&J) SARS-COV-2 Vaccination 08/31/2019   Td 08/28/2009     Objective: Vital Signs: There were no vitals taken for this visit.   Physical Exam   Musculoskeletal Exam: ***  CDAI Exam: CDAI Score: -- Patient Global: --; Provider Global: -- Swollen: --; Tender: -- Joint Exam 01/01/2021   No joint exam has been documented for this visit   There is currently no information documented on the homunculus. Go to the Rheumatology activity and complete the homunculus joint exam.  Investigation: No additional findings.  Imaging: No results found.  Recent Labs: Lab Results  Component Value Date   WBC 7.7 08/08/2020   HGB 13.5 08/08/2020   PLT 180 08/08/2020   NA 141 08/08/2020   K 4.0 08/08/2020   CL 104 08/08/2020   CO2 21 08/08/2020   GLUCOSE 99 08/08/2020   BUN 13 08/08/2020   CREATININE 0.92 08/08/2020   BILITOT 0.5 08/08/2020   ALKPHOS 118 08/08/2020   AST 16 08/08/2020   ALT 14 08/08/2020   PROT 6.8 08/08/2020   ALBUMIN 4.4 08/08/2020   CALCIUM 9.4 08/08/2020   GFRAA 78 03/13/2020   QFTBGOLD Negative 07/29/2016   QFTBGOLDPLUS Negative 08/08/2020    Speciality Comments:  Osteoporosis manged by Dr. Estanislado Pandy.  She declines treatment-ACY 01/11/2019  Procedures:  No procedures performed Allergies: Doxycycline   Assessment / Plan:     Visit Diagnoses: No diagnosis found.  Orders: No orders of the defined types were placed in this encounter.  No orders of the defined types were placed in this encounter.   Face-to-face time spent with patient was *** minutes. Greater than 50% of time was spent in counseling and  coordination of care.  Follow-Up Instructions: No follow-ups on file.   Earnestine Mealing, CMA  Note - This record has been created using Editor, commissioning.  Chart creation errors have been sought, but may not always  have been located. Such creation errors do not reflect on  the standard of medical care.

## 2020-12-21 ENCOUNTER — Ambulatory Visit: Payer: BC Managed Care – PPO | Admitting: Family Medicine

## 2020-12-26 ENCOUNTER — Encounter: Payer: Self-pay | Admitting: Family Medicine

## 2021-01-01 ENCOUNTER — Ambulatory Visit: Payer: BC Managed Care – PPO | Admitting: Rheumatology

## 2021-01-01 DIAGNOSIS — Z79899 Other long term (current) drug therapy: Secondary | ICD-10-CM

## 2021-01-01 DIAGNOSIS — M19041 Primary osteoarthritis, right hand: Secondary | ICD-10-CM

## 2021-01-01 DIAGNOSIS — M810 Age-related osteoporosis without current pathological fracture: Secondary | ICD-10-CM

## 2021-01-01 DIAGNOSIS — M0579 Rheumatoid arthritis with rheumatoid factor of multiple sites without organ or systems involvement: Secondary | ICD-10-CM

## 2021-01-01 DIAGNOSIS — M19072 Primary osteoarthritis, left ankle and foot: Secondary | ICD-10-CM

## 2021-01-01 DIAGNOSIS — Z8709 Personal history of other diseases of the respiratory system: Secondary | ICD-10-CM

## 2021-01-08 ENCOUNTER — Other Ambulatory Visit: Payer: Self-pay | Admitting: *Deleted

## 2021-01-08 ENCOUNTER — Telehealth: Payer: Self-pay

## 2021-01-08 DIAGNOSIS — Z79899 Other long term (current) drug therapy: Secondary | ICD-10-CM

## 2021-01-08 NOTE — Telephone Encounter (Signed)
Patient called requesting labwork orders be sent to [facility] in [place].  Patient plans to go this morning, 01/08/21.

## 2021-01-08 NOTE — Telephone Encounter (Signed)
Lab Orders released.  

## 2021-01-09 LAB — CMP14+EGFR
ALT: 11 IU/L (ref 0–32)
AST: 18 IU/L (ref 0–40)
Albumin/Globulin Ratio: 1.5 (ref 1.2–2.2)
Albumin: 4.1 g/dL (ref 3.8–4.8)
Alkaline Phosphatase: 104 IU/L (ref 44–121)
BUN/Creatinine Ratio: 12 (ref 12–28)
BUN: 11 mg/dL (ref 8–27)
Bilirubin Total: 0.4 mg/dL (ref 0.0–1.2)
CO2: 22 mmol/L (ref 20–29)
Calcium: 9.4 mg/dL (ref 8.7–10.3)
Chloride: 106 mmol/L (ref 96–106)
Creatinine, Ser: 0.92 mg/dL (ref 0.57–1.00)
Globulin, Total: 2.7 g/dL (ref 1.5–4.5)
Glucose: 81 mg/dL (ref 65–99)
Potassium: 3.9 mmol/L (ref 3.5–5.2)
Sodium: 140 mmol/L (ref 134–144)
Total Protein: 6.8 g/dL (ref 6.0–8.5)
eGFR: 70 mL/min/{1.73_m2} (ref >59)

## 2021-01-09 LAB — CBC
Hematocrit: 39.5 % (ref 34.0–46.6)
Hemoglobin: 13.5 g/dL (ref 11.1–15.9)
MCH: 31.3 pg (ref 26.6–33.0)
MCHC: 34.2 g/dL (ref 31.5–35.7)
MCV: 92 fL (ref 79–97)
Platelets: 164 10*3/uL (ref 150–450)
RBC: 4.31 x10E6/uL (ref 3.77–5.28)
RDW: 13.1 % (ref 11.7–15.4)
WBC: 7.7 10*3/uL (ref 3.4–10.8)

## 2021-01-09 LAB — LIPID PANEL
Chol/HDL Ratio: 3.7 ratio (ref 0.0–4.4)
Cholesterol, Total: 172 mg/dL (ref 100–199)
HDL: 47 mg/dL (ref >39)
LDL Chol Calc (NIH): 103 mg/dL — ABNORMAL HIGH (ref 0–99)
Triglycerides: 122 mg/dL (ref 0–149)
VLDL Cholesterol Cal: 22 mg/dL (ref 5–40)

## 2021-02-07 NOTE — Progress Notes (Signed)
Office Visit Note  Patient: Dawn Meyer             Date of Birth: May 25, 1956           MRN: [id number]             PCP: Erven Colla, DO Referring: Erven Colla, DO Visit Date: 02/21/2021 Occupation: '@GUAROCC'$ @  Subjective:  Pain in right hand   History of Present Illness: Dawn Meyer is a 65 y.o. female with a history of seropositive rheumatoid arthritis.  According to the patient she lost her father in February 2022.  She has been very busy selling his house and renovating her house.  They have been extra stress on her right hand.  She states she has been noticing pain and swelling in her right third MCP joint.  She states due to her busy schedule she is also forgotten some of the doses of Humira.  Now she is in her home and more settled.  She has been taking Humira on a regular basis.  None of the other joints are swollen.She has been going to the podiatrist on a regular basis due to osteoarthritis in her feet.  Activities of Daily Living:  Patient reports morning stiffness for 0 minutes.   Patient Denies nocturnal pain.  Difficulty dressing/grooming: Denies Difficulty climbing stairs: Denies Difficulty getting out of chair: Denies Difficulty using hands for taps, buttons, cutlery, and/or writing: Denies  Review of Systems  Constitutional:  Negative for fatigue.  HENT:  Negative for mouth sores, mouth dryness and nose dryness.   Eyes:  Negative for pain, itching and dryness.  Respiratory:  Negative for shortness of breath and difficulty breathing.   Cardiovascular:  Negative for chest pain and palpitations.  Gastrointestinal:  Negative for blood in stool, constipation and diarrhea.  Endocrine: Negative for increased urination.  Genitourinary:  Negative for difficulty urinating.  Musculoskeletal:  Positive for joint pain, joint pain and joint swelling. Negative for myalgias, morning stiffness, muscle tenderness and myalgias.  Skin:  Negative for color  change, rash and redness.  Allergic/Immunologic: Negative for susceptible to infections.  Neurological:  Negative for dizziness, numbness, headaches, memory loss and weakness.  Hematological:  Positive for bruising/bleeding tendency.  Psychiatric/Behavioral:  Negative for confusion.    PMFS History:  Patient Active Problem List   Diagnosis Date Noted   Acute suppurative otitis media of right ear without spontaneous rupture of tympanic membrane 09/19/2020   Asthma with bronchitis 09/19/2020   Allergic rhinitis 11/17/2017   Deviated nasal septum 11/17/2017   Recurrent sinusitis 11/17/2017   Smokes 11/17/2017   Rheumatoid arthritis involving multiple sites with positive rheumatoid factor ([place]) 08/18/2016   Swollen wrist, left 08/18/2016   Primary osteoarthritis of both hands 08/18/2016   Primary osteoarthritis of both feet 08/18/2016   OP (osteoporosis) 08/18/2016   Degenerative joint disease involving multiple joints 08/18/2016   High risk medication use 08/18/2016    Past Medical History:  Diagnosis Date   Asthma    RA (rheumatoid arthritis) ([place])     Family History  Problem Relation Age of Onset   Congestive Heart Failure Mother    Heart attack Mother    Diabetes Father    Heart Problems Father        pacemaker   Diverticulitis Sister    Hypertension Sister    Heart attack Brother    Healthy Daughter    Past Surgical History:  Procedure Laterality Date   MOHS SURGERY  VAGINAL DELIVERY     Social History   Social History Narrative   Not on file   Immunization History  Administered Date(s) Administered   Influenza Inj Mdck Quad Pf 03/12/2017   Influenza-Unspecified 03/20/2016, 03/11/2018, 03/13/2020   Janssen (J&J) SARS-COV-2 Vaccination 08/31/2019   Td 08/28/2009     Objective: Vital Signs: BP (!) 174/78 (BP Location: Left Arm, Patient Position: Sitting, Cuff Size: Normal)   Pulse 64   Ht 5' 7.5" (1.715 m)   Wt 149 lb (67.6 kg)   BMI 22.99 kg/m     Physical Exam Vitals and nursing note reviewed.  Constitutional:      Appearance: She is well-developed.  HENT:     Head: Normocephalic and atraumatic.  Eyes:     Conjunctiva/sclera: Conjunctivae normal.  Cardiovascular:     Rate and Rhythm: Normal rate and regular rhythm.     Heart sounds: Normal heart sounds.  Pulmonary:     Effort: Pulmonary effort is normal.     Breath sounds: Normal breath sounds.  Abdominal:     General: Bowel sounds are normal.     Palpations: Abdomen is soft.  Musculoskeletal:     Cervical back: Normal range of motion.  Lymphadenopathy:     Cervical: No cervical adenopathy.  Skin:    General: Skin is warm and dry.     Capillary Refill: Capillary refill takes less than 2 seconds.  Neurological:     Mental Status: She is alert and oriented to person, place, and time.  Psychiatric:        Behavior: Behavior normal.     Musculoskeletal Exam: C-spine was in good range of motion.  Shoulder joints, elbow joints, wrist joints in good range of motion.  She has synovitis over right third MCP joint.  Hip joints, knee joints, ankles, MTPs and PIPs with good range of motion with no synovitis.  CDAI Exam: CDAI Score: 2.8  Patient Global: 4 mm; Provider Global: 4 mm Swollen: 1 ; Tender: 1  Joint Exam 02/21/2021      Right  Left  MCP 3  Swollen Tender        Investigation: No additional findings.  Imaging: No results found.  Recent Labs: Lab Results  Component Value Date   WBC 7.7 01/08/2021   HGB 13.5 01/08/2021   PLT 164 01/08/2021   NA 140 01/08/2021   K 3.9 01/08/2021   CL 106 01/08/2021   CO2 22 01/08/2021   GLUCOSE 81 01/08/2021   BUN 11 01/08/2021   CREATININE 0.92 01/08/2021   BILITOT 0.4 01/08/2021   ALKPHOS 104 01/08/2021   AST 18 01/08/2021   ALT 11 01/08/2021   PROT 6.8 01/08/2021   ALBUMIN 4.1 01/08/2021   CALCIUM 9.4 01/08/2021   GFRAA 78 03/13/2020   QFTBGOLD Negative 07/29/2016   QFTBGOLDPLUS Negative 08/08/2020     Speciality Comments: Osteoporosis manged by Dr. Estanislado Pandy.  She declines treatment-ACY 01/11/2019  Procedures:  No procedures performed Allergies: Doxycycline   Assessment / Plan:     Visit Diagnoses: Rheumatoid arthritis involving multiple sites with positive rheumatoid factor (HCC)-she is having a flare of rheumatoid arthritis that she has been doing extra work in the home.  Her father passed away in 07/26/20 and she had to clean up his house and sell his house.  She also renovated her house.  She states she missed her Humira doses.  Now she has been taking Humira on a regular basis.  Per her request I will  send her prescription for prednisone taper starting at 20 mg and taper by 5 mg every 4 days.  Side effects of prednisone were also discussed.  High risk medication use - Humira 40 mg sq injections every 14 days.  Her last labs were in August.  She has been advised to get labs every 3 months to monitor for drug toxicity.  TB gold was negative in March 2022.  Information regarding realization was placed in the AVS.  She was advised to stop Humira in case she develops an infection and restart once infection resolves.  She has been getting yearly skin examination to screen for nonmelanoma skin cancer.  Primary osteoarthritis of both hands-joint protection muscle strengthening was discussed.  Primary osteoarthritis of both feet-she goes to [facility].  Age-related osteoporosis without current pathological fracture - DEXA 01/19/14 left forearm at the 33% radius site, BMD 0.588 T-score -3.3.  I discussed monitoring DEXA but she declined.  History of asthma  Elevated blood pressure reading-patient states that her PCP is aware of her blood pressure reading.  She will be monitoring her blood pressure at home.  I discussed that prednisone may increase her blood pressure as well.  She will contact her PCP once she gets blood pressure readings.  Orders: No orders of the defined types  were placed in this encounter.  Meds ordered this encounter  Medications   predniSONE (DELTASONE) 5 MG tablet    Sig: Take 4 tabs po qd x 4 days, 3  tabs po qd x 4 days, 2  tabs po qd x 4 days, 1  tab po qd x 4 days    Dispense:  40 tablet    Refill:  0      Follow-Up Instructions: Return in about 5 months (around 07/23/2021) for Rheumatoid arthritis, Osteoarthritis.   Bo Merino, MD  Note - This record has been created using Editor, commissioning.  Chart creation errors have been sought, but may not always  have been located. Such creation errors do not reflect on  the standard of medical care.

## 2021-02-21 ENCOUNTER — Ambulatory Visit: Payer: BC Managed Care – PPO | Admitting: Rheumatology

## 2021-02-21 ENCOUNTER — Other Ambulatory Visit: Payer: Self-pay

## 2021-02-21 ENCOUNTER — Encounter: Payer: Self-pay | Admitting: Rheumatology

## 2021-02-21 VITALS — BP 174/78 | HR 64 | Ht 67.5 in | Wt 149.0 lb

## 2021-02-21 DIAGNOSIS — M19042 Primary osteoarthritis, left hand: Secondary | ICD-10-CM

## 2021-02-21 DIAGNOSIS — M0579 Rheumatoid arthritis with rheumatoid factor of multiple sites without organ or systems involvement: Secondary | ICD-10-CM

## 2021-02-21 DIAGNOSIS — M19072 Primary osteoarthritis, left ankle and foot: Secondary | ICD-10-CM

## 2021-02-21 DIAGNOSIS — R030 Elevated blood-pressure reading, without diagnosis of hypertension: Secondary | ICD-10-CM

## 2021-02-21 DIAGNOSIS — M810 Age-related osteoporosis without current pathological fracture: Secondary | ICD-10-CM

## 2021-02-21 DIAGNOSIS — M19071 Primary osteoarthritis, right ankle and foot: Secondary | ICD-10-CM | POA: Diagnosis not present

## 2021-02-21 DIAGNOSIS — Z8709 Personal history of other diseases of the respiratory system: Secondary | ICD-10-CM

## 2021-02-21 DIAGNOSIS — M19041 Primary osteoarthritis, right hand: Secondary | ICD-10-CM

## 2021-02-21 DIAGNOSIS — Z79899 Other long term (current) drug therapy: Secondary | ICD-10-CM

## 2021-02-21 MED ORDER — PREDNISONE 5 MG PO TABS: ORAL_TABLET | ORAL | 0 refills | Status: DC

## 2021-02-21 NOTE — Patient Instructions (Signed)
Standing Labs We placed an order today for your standing lab work.   Please have your standing labs drawn in November and every 3 months  If possible, please have your labs drawn 2 weeks prior to your appointment so that the provider can discuss your results at your appointment.  Please note that you may see your imaging and lab results in [place] before we have reviewed them. We may be awaiting multiple results to interpret others before contacting you. Please allow our office up to 72 hours to thoroughly review all of the results before contacting the office for clarification of your results.  We have open lab daily: Monday through Thursday from 1:30-4:30 PM and Friday from 1:30-4:00 PM at the office of Dr. Bo Merino, [facility].   Please be advised, all patients with office appointments requiring lab work will take precedent over walk-in lab work.  If possible, please come for your lab work on Monday and Friday afternoons, as you may experience shorter wait times. The office is located at [address] [place], [place], [place] [address] No appointment is necessary.   Labs are drawn by [facility]. Please bring your co-pay at the time of your lab draw.  You may receive a bill from [facility] for your lab work.  If you wish to have your labs drawn at another location, please call the office 24 hours in advance to send orders.  If you have any questions regarding directions or hours of operation,  please call [phone].   As a reminder, please drink plenty of water prior to coming for your lab work. Thanks!   Vaccines You are taking a medication(s) that can suppress your immune system.  The following immunizations are recommended: Flu annually Covid-19  Td/Tdap (tetanus, diphtheria, pertussis) every 10 years Pneumonia (Prevnar 15 then Pneumovax 23 at least 1 year apart.  Alternatively, can take Prevnar 20 without needing additional dose) Shingrix: 2 doses from 4  weeks to 6 months apart  Please check with your PCP to make sure you are up to date.  If you test POSITIVE for COVID19 and have MILD to MODERATE symptoms: First, call your PCP if you would like to receive COVID19 treatment AND Hold your medications during the infection and for at least 1 week after your symptoms have resolved: Injectable medication (Benlysta, Cimzia, Cosentyx, Enbrel, Humira, Orencia, Remicade, Simponi, Stelara, Taltz, Tremfya) Methotrexate Leflunomide (Arava) Azathioprine Mycophenolate (Cellcept) Roma Kayser, or Rinvoq Otezla If you take Actemra or Kevzara, you DO NOT need to hold these for COVID19 infection.  If you test POSITIVE for COVID19 and have NO symptoms: First, call your PCP if you would like to receive COVID19 treatment AND Hold your medications for at least 10 days after the day that you tested positive Injectable medication (Benlysta, Cimzia, Cosentyx, Enbrel, Humira, Orencia, Remicade, Simponi, Stelara, Taltz, Tremfya) Methotrexate Leflunomide (Arava) Azathioprine Mycophenolate (Cellcept) Roma Kayser, or Rinvoq Otezla If you take Actemra or Kevzara, you DO NOT need to hold these for COVID19 infection.  If you have signs or symptoms of an infection or start antibiotics: First, call your PCP for workup of your infection. Hold your medication through the infection, until you complete your antibiotics, and until symptoms resolve if you take the following: Injectable medication (Actemra, Benlysta, Cimzia, Cosentyx, Enbrel, Humira, Kevzara, Orencia, Remicade, Simponi, Stelara, Taltz, Tremfya) Methotrexate Leflunomide (Arava) Mycophenolate (Cellcept) Morrie Sheldon, Olumiant, or Rinvoq

## 2021-03-20 ENCOUNTER — Other Ambulatory Visit: Payer: Self-pay | Admitting: Physician Assistant

## 2021-03-20 DIAGNOSIS — M0579 Rheumatoid arthritis with rheumatoid factor of multiple sites without organ or systems involvement: Secondary | ICD-10-CM

## 2021-03-20 NOTE — Telephone Encounter (Signed)
Next Visit: 07/25/2021  Last Visit: 02/21/2021  Last Fill: 09/14/2020  FE:[id number] arthritis involving multiple sites with positive rheumatoid factor  Current Dose per office note 02/21/2021: Humira 40 mg sq injections every 14 days  Labs: 01/08/2021 WNL  TB Gold: 08/08/2020 Neg    Okay to refill Humira?

## 2021-04-01 ENCOUNTER — Encounter: Payer: BC Managed Care – PPO | Admitting: Family Medicine

## 2021-04-11 ENCOUNTER — Ambulatory Visit (INDEPENDENT_AMBULATORY_CARE_PROVIDER_SITE_OTHER): Payer: BC Managed Care – PPO | Admitting: Family Medicine

## 2021-04-11 ENCOUNTER — Other Ambulatory Visit: Payer: Self-pay

## 2021-04-11 VITALS — BP 152/86 | HR 86 | Ht 67.5 in | Wt 150.8 lb

## 2021-04-11 DIAGNOSIS — Z0001 Encounter for general adult medical examination with abnormal findings: Secondary | ICD-10-CM

## 2021-04-11 DIAGNOSIS — Z1211 Encounter for screening for malignant neoplasm of colon: Secondary | ICD-10-CM | POA: Diagnosis not present

## 2021-04-11 DIAGNOSIS — Z720 Tobacco use: Secondary | ICD-10-CM

## 2021-04-11 DIAGNOSIS — Z0000 Encounter for general adult medical examination without abnormal findings: Secondary | ICD-10-CM | POA: Insufficient documentation

## 2021-04-11 MED ORDER — ALBUTEROL SULFATE HFA 108 (90 BASE) MCG/ACT IN AERS: INHALATION_SPRAY | RESPIRATORY_TRACT | 3 refills | Status: DC

## 2021-04-11 NOTE — Progress Notes (Signed)
Subjective:  Patient ID: Dawn Meyer, female    DOB: 13-Oct-1955  Age: 65 y.o. MRN: [id number]  CC: Physical  HPI MARLISSA EMERICK is a 65 y.o. female presents to the clinic today to establish care and for an annual physical.  Preventative Healthcare Pap smear: Overdue. States that she is planning on see [facility] for pap smear.  Mammogram: Overdue. States that she will get mammogram at [facility]. Colonoscopy: Has never has a colonoscopy. Will place referral. Immunizations Tetanus - Due. Pneumococcal - Due.  Flu - Done. Zoster - Due. Hepatitis C screening - Declines. Suspect that this has been done by Rheumatology.  Labs: Has had recent labs. Will review today. Alcohol use: No.  Smoking/tobacco use: Current smoker. No recent DEXA. Has known osteoporosis.   PMH, Surgical Hx, Family Hx, Social History reviewed and updated as below.  Past Medical History:  Diagnosis Date   Asthma    RA (rheumatoid arthritis) ([place])    Past Surgical History:  Procedure Laterality Date   MOHS SURGERY     VAGINAL DELIVERY     Family History  Problem Relation Age of Onset   Congestive Heart Failure Mother    Heart attack Mother    Diabetes Father    Heart Problems Father        pacemaker   Diverticulitis Sister    Hypertension Sister    Heart attack Brother    Healthy Daughter    Social History   Tobacco Use   Smoking status: Every Day    Packs/day: 0.25    Years: 40.00    Pack years: 10.00    Types: Cigarettes   Smokeless tobacco: Never  Substance Use Topics   Alcohol use: No    Review of Systems  Constitutional: Negative.   Respiratory:         Chest tightness & SOB at times.  Cardiovascular: Negative.    Objective:   Today's Vitals: BP (!) 152/86   Pulse 86   Ht 5' 7.5" (1.715 m)   Wt 150 lb 12.8 oz (68.4 kg)   BMI 23.27 kg/m   Physical Exam Vitals and nursing note reviewed.  Constitutional:      General: She is not in acute distress.     Appearance: Normal appearance. She is not ill-appearing.  HENT:     Head: Normocephalic and atraumatic.     Mouth/Throat:     Pharynx: Oropharynx is clear.  Eyes:     General:        Right eye: No discharge.        Left eye: No discharge.     Conjunctiva/sclera: Conjunctivae normal.  Cardiovascular:     Rate and Rhythm: Normal rate and regular rhythm.  Pulmonary:     Effort: Pulmonary effort is normal.     Breath sounds: Normal breath sounds. No wheezing or rales.  Abdominal:     General: There is no distension.     Palpations: Abdomen is soft.     Tenderness: There is no abdominal tenderness.  Neurological:     Mental Status: She is alert.  Psychiatric:        Mood and Affect: Mood normal.        Behavior: Behavior normal.     Assessment & Plan:   Problem List Items Addressed This Visit       Other   Tobacco abuse   Relevant Medications   albuterol (VENTOLIN HFA) 108 (90 Base) MCG/ACT inhaler  Encounter for preventative adult health care examination - Primary    Patient will see OB GYN for pap smear and mammogram. Referral for colonoscopy placed.  Wants to wait on immunizations at this time. Labs reviewed today.  Discussed need for treatment of osteoporosis. Declines at this time. Will follow up with rheumatology regarding this. Discussed lung cancer screening and need for smoking cessation. Declines medications for smoking cessation at this time.      Other Visit Diagnoses     Encounter for screening colonoscopy       Relevant Orders   Ambulatory referral to Gastroenterology       Meds ordered this encounter  Medications   albuterol (VENTOLIN HFA) 108 (90 Base) MCG/ACT inhaler    Sig: INHALE TWO PUFFS INTO THE LUNGS EVERY SIX HOURS AS NEEDED FOR WHEEZING OR SHORTNESS OF BREATH    Dispense:  18 g    Refill:  3     Follow-up: Return in about 6 months (around 10/09/2021) for Follow up Chronic medical issues.  [place]

## 2021-04-11 NOTE — Assessment & Plan Note (Signed)
Patient will see OB GYN for pap smear and mammogram. Referral for colonoscopy placed.  Wants to wait on immunizations at this time. Labs reviewed today.  Discussed need for treatment of osteoporosis. Declines at this time. Will follow up with rheumatology regarding this. Discussed lung cancer screening and need for smoking cessation. Declines medications for smoking cessation at this time.

## 2021-04-11 NOTE — Patient Instructions (Signed)
Please see OB GYN for pap smear and mammogram.  I am going to place the referral for colonoscopy.  Cut back on the smoking.  Follow up in 6 months.  Take care  Dr. Lacinda Axon

## 2021-04-15 ENCOUNTER — Ambulatory Visit: Payer: BC Managed Care – PPO | Admitting: Podiatry

## 2021-04-15 ENCOUNTER — Telehealth: Payer: Self-pay

## 2021-04-15 ENCOUNTER — Other Ambulatory Visit: Payer: Self-pay

## 2021-04-15 DIAGNOSIS — M216X1 Other acquired deformities of right foot: Secondary | ICD-10-CM

## 2021-04-15 DIAGNOSIS — M2062 Acquired deformities of toe(s), unspecified, left foot: Secondary | ICD-10-CM | POA: Diagnosis not present

## 2021-04-15 DIAGNOSIS — M21611 Bunion of right foot: Secondary | ICD-10-CM | POA: Diagnosis not present

## 2021-04-15 DIAGNOSIS — Q828 Other specified congenital malformations of skin: Secondary | ICD-10-CM | POA: Diagnosis not present

## 2021-04-15 NOTE — Telephone Encounter (Signed)
Received notification from [employer] that pt's current PA is expiring.   Submitted a Prior Authorization request to [facility] for HUMIRA via CoverMyMeds. Will update once we receive a response.   Key: [id number]

## 2021-04-16 ENCOUNTER — Encounter: Payer: Self-pay | Admitting: Internal Medicine

## 2021-04-16 NOTE — Telephone Encounter (Signed)
Received notification from [facility] regarding a prior authorization for Dawn Meyer. Authorization has been APPROVED from 04/16/2021 to 04/16/2022.   Patient must continue to fill through [facility]: [phone]  Authorization # [id number]  [employer] portal has been updated with new PA info and approval letter has been sent to scan center.

## 2021-04-21 NOTE — Progress Notes (Signed)
Subjective: 65 year old female presents the office with concern of bilateral foot pain, callus formation.  She denies any changes in the last saw her otherwise.  No open sores or swelling or redness or any drainage that she has noticed from the areas.  She tries to continue offloading moisturizer.  Objective: AAO x3, NAD DP/PT pulses palpable bilaterally, CRT less than 3 seconds Hyperkeratotic lesions noted submetatarsal 3 on the right foot as well as left plantar hallux.  Also callus formation present submetatarsal 5 on the left foot.  There is no underlying ulceration drainage or any signs of infection.  There is no open lesions.  Prominent metatarsal heads plantarly with atrophy of the fat pad.  Bunions are present as well.  Hallux extensors on the left side resulting in the plantar callus. No pain with calf compression, swelling, warmth, erythema  Assessment: Hyperkeratotic lesions to digital deformity, prominent metatarsal heads  Plan: -All treatment options discussed with the patient including all alternatives, risks, complications.  -Sharply debrided the hyperkeratotic lesions with any complications x3 as a courtesy.  Continue moisturizer and offloading daily.  Discussed offloading pads and shoe modifications.  Monitoring skin breakdown or signs of infection. -Patient encouraged to call the office with any questions, concerns, change in symptoms.   Trula Slade DPM

## 2021-04-26 ENCOUNTER — Telehealth: Payer: Self-pay | Admitting: Rheumatology

## 2021-04-26 NOTE — Telephone Encounter (Signed)
Patient called pharmacy to get refill on Humira, and was told her drug card was not longer on file, and patient cannot pay amount required for medication. Please call to advise patient.

## 2021-04-29 NOTE — Telephone Encounter (Signed)
Per the [employer] portal, pt's copay card information is:  RxBIN: [id number] RxPCN: [id number] RxGRP: [id number] ID: [id number] Suf: 01  [facility].

## 2021-04-30 NOTE — Telephone Encounter (Signed)
[facility] the [facility]ay card is still on file and was indeed billed, however the card only covered an additional ~$500 and leaving the pt with a copay of >$500. Pt was then instructed to contact the copay program for follow up. Will reach out to patient to re-explain the situation and advise her on next steps.

## 2021-04-30 NOTE — Telephone Encounter (Signed)
Can provide sample of Humira to patient, but she will need to contact [employer] rep to opt into additional debit card fund to allow her to fill Humira through pharmacy through 05/25/21.  From 05/26/21, her copay card will be replenished with funds. She will likely encounter same roadblock next year once she depletes funds and will need to contact copay card company ASAP to receive additional copay assistance. This process must be fully owned by patient since provider's office is not allowed to initiate or complete for patient.  Knox Saliva, PharmD, MPH, BCPS Clinical Pharmacist (Rheumatology and Pulmonology)

## 2021-04-30 NOTE — Telephone Encounter (Signed)
Called and discussed next steps with patient, explained that she needed to inform rep that she has used up all the funds on her copay card and needs to apply for the bridge program. Pt verbalized understanding, but goes on to state that she was due for her shot last Friday and has concerns about length of process. Advised [facility] and request they rebill the Rx if they tell her that process will take less than 24 hours, but to reach out to [place] here at the clinic if process will take longer than 24 hours and we can see about potentially providing a sample.  Will reach out to [facility] to inquire on current sample situation.

## 2021-05-07 NOTE — Telephone Encounter (Signed)
Called patient to follow-up on Humira copay card. They set up one-time debit card for patient and she states that she hasn't received bil yet but presumes her copay issues for Humira have been resolved. She inquired if she will run into same barrier next year. I advised that it is difficult to predict, but the steps to continue to receive copay assistance will be the same for next year. She verbalized understanding and expressed gratitude  Knox Saliva, PharmD, MPH, BCPS Clinical Pharmacist (Rheumatology and Pulmonology)

## 2021-05-24 ENCOUNTER — Telehealth: Payer: Self-pay | Admitting: Family Medicine

## 2021-05-24 NOTE — Telephone Encounter (Signed)
FYI- patient called on 12/28 wanting appointment my co-worker explain we didn't have any more appointment and she was referred to Urgent Care on [address] she refused and we offered appointment on 05/29/2021 that was the first appointment we had  . She came today at side door for appointment we explain that her appointment was 05/29/21 with Barbee Shropshire not today she was upset and rude and wanting to be seen. Explained again we didn't have any appointment with one provider off and one out sick. She didn't care about that she wanted to be seen the nurse said referred to Urgent Care she refused again. Had to reschedule for 05/28/21 at 4pm side door

## 2021-05-28 ENCOUNTER — Ambulatory Visit: Payer: BC Managed Care – PPO | Admitting: Nurse Practitioner

## 2021-05-29 ENCOUNTER — Ambulatory Visit: Payer: BC Managed Care – PPO | Admitting: Nurse Practitioner

## 2021-06-10 ENCOUNTER — Ambulatory Visit: Payer: BC Managed Care – PPO

## 2021-07-11 NOTE — Progress Notes (Signed)
Office Visit Note  Patient: Dawn Meyer             Date of Birth: 1956/01/27           MRN: [id number]             PCP: Coral Spikes, DO Referring: Erven Colla, DO Visit Date: 07/25/2021 Occupation: @GUAROCC @  Subjective:  Medication monitoring   History of Present Illness: Dawn Meyer is a 66 y.o. female with history of seropositive rheumatoid arthritis, osteoarthritis, and osteoporosis.  Patient remains on Humira 40 mg sq injections every 14 days.  She is tolerating Humira without any side effects or injection site reactions.  She denies any signs or symptoms of a rheumatoid arthritis flare.  She has not had any morning stiffness, nocturnal pain, or difficulty with ADLs.  She denies any joint swelling at this time.   She states that about 2 weeks ago she was treated for sinus infection with Augmentin.  She states that she did not have to hold Humira during that time.  She denies any other recent infections.  She denies any other new medical conditions.  She continues to have regular skin examinations due to her history of squamous cell carcinomas.  She reports that she will be scheduling an updated colonoscopy, Pap smear, and mammogram soon. She is not on any treatment for osteoporosis.   Activities of Daily Living:  Patient reports morning stiffness for 0 minutes.   Patient Denies nocturnal pain.  Difficulty dressing/grooming: Denies Difficulty climbing stairs: Denies Difficulty getting out of chair: Denies Difficulty using hands for taps, buttons, cutlery, and/or writing: Denies  Review of Systems  Constitutional:  Negative for fatigue.  HENT:  Negative for mouth sores, mouth dryness and nose dryness.   Eyes:  Negative for pain, itching and dryness.  Respiratory:  Negative for shortness of breath and difficulty breathing.   Cardiovascular:  Negative for chest pain and palpitations.  Gastrointestinal:  Negative for blood in stool, constipation and diarrhea.   Endocrine: Negative for increased urination.  Genitourinary:  Negative for difficulty urinating.  Musculoskeletal:  Negative for joint pain, joint pain, joint swelling, myalgias, morning stiffness, muscle tenderness and myalgias.  Skin:  Negative for color change, rash and redness.  Allergic/Immunologic: Negative for susceptible to infections.  Neurological:  Negative for dizziness, numbness, headaches, memory loss and weakness.  Hematological:  Positive for bruising/bleeding tendency.  Psychiatric/Behavioral:  Negative for confusion.    PMFS History:  Patient Active Problem List   Diagnosis Date Noted   Acute frontal sinusitis 07/17/2021   Encounter for preventative adult health care examination 04/11/2021   Allergic rhinitis 11/17/2017   Tobacco abuse 11/17/2017   Rheumatoid arthritis involving multiple sites with positive rheumatoid factor ([place]) 08/18/2016   OP (osteoporosis) 08/18/2016    Past Medical History:  Diagnosis Date   Asthma    RA (rheumatoid arthritis) ([place])     Family History  Problem Relation Age of Onset   Congestive Heart Failure Mother    Heart attack Mother    Diabetes Father    Heart Problems Father        pacemaker   Diverticulitis Sister    Hypertension Sister    Heart attack Brother    Healthy Daughter    Past Surgical History:  Procedure Laterality Date   MOHS SURGERY     SQUAMOUS CELL CARCINOMA EXCISION     VAGINAL DELIVERY     Social History   Social History Narrative  Not on file   Immunization History  Administered Date(s) Administered   Influenza Inj Mdck Quad Pf 03/12/2017   Influenza-Unspecified 03/20/2016, 03/11/2018, 03/13/2020, 03/16/2021   Janssen (J&J) SARS-COV-2 Vaccination 08/31/2019   Td 08/28/2009     Objective: Vital Signs: BP (!) 160/84 (BP Location: Left Arm, Patient Position: Sitting, Cuff Size: Normal)    Pulse 60    Ht 5\' 7"  (1.702 m)    Wt 153 lb (69.4 kg)    BMI 23.96 kg/m    Physical Exam Vitals and  nursing note reviewed.  Constitutional:      Appearance: She is well-developed.  HENT:     Head: Normocephalic and atraumatic.  Eyes:     Conjunctiva/sclera: Conjunctivae normal.  Cardiovascular:     Rate and Rhythm: Normal rate and regular rhythm.     Heart sounds: Normal heart sounds.  Pulmonary:     Effort: Pulmonary effort is normal.     Breath sounds: Normal breath sounds.  Abdominal:     General: Bowel sounds are normal.     Palpations: Abdomen is soft.  Musculoskeletal:     Cervical back: Normal range of motion.  Lymphadenopathy:     Cervical: No cervical adenopathy.  Skin:    General: Skin is warm and dry.     Capillary Refill: Capillary refill takes less than 2 seconds.  Neurological:     Mental Status: She is alert and oriented to person, place, and time.  Psychiatric:        Behavior: Behavior normal.     Musculoskeletal Exam: C-spine, thoracic spine, lumbar spine have good range of motion.  No midline spinal tenderness or SI joint tenderness.  Shoulder joints, elbow joints, wrist joints, MCPs, PIPs, DIPs have good range of motion with no synovitis.  She was able to make a complete fist bilaterally.  PIP and DIP thickening consistent with osteoarthritis of both hands.  Hip joints have good range of motion with no groin pain.  No tenderness upon palpation over the trochanteric bursa bilaterally.  Knee joints have good range of motion with no warmth or effusion.  Ankle joints have good range of motion with no tenderness or joint swelling.  No tenderness or synovitis over MTP joints.  CDAI Exam: CDAI Score: 0.7  Patient Global: 5 mm; Provider Global: 2 mm Swollen: 0 ; Tender: 0  Joint Exam 07/25/2021   No joint exam has been documented for this visit   There is currently no information documented on the homunculus. Go to the Rheumatology activity and complete the homunculus joint exam.  Investigation: No additional findings.  Imaging: No results found.  Recent  Labs: Lab Results  Component Value Date   WBC 7.7 01/08/2021   HGB 13.5 01/08/2021   PLT 164 01/08/2021   NA 140 01/08/2021   K 3.9 01/08/2021   CL 106 01/08/2021   CO2 22 01/08/2021   GLUCOSE 81 01/08/2021   BUN 11 01/08/2021   CREATININE 0.92 01/08/2021   BILITOT 0.4 01/08/2021   ALKPHOS 104 01/08/2021   AST 18 01/08/2021   ALT 11 01/08/2021   PROT 6.8 01/08/2021   ALBUMIN 4.1 01/08/2021   CALCIUM 9.4 01/08/2021   GFRAA 78 03/13/2020   QFTBGOLD Negative 07/29/2016   QFTBGOLDPLUS Negative 08/08/2020    Speciality Comments: Osteoporosis manged by Dr. Estanislado Pandy.  She declines treatment-ACY 01/11/2019  Procedures:  No procedures performed Allergies: Doxycycline   Assessment / Plan:     Visit Diagnoses: Rheumatoid arthritis involving multiple sites  with positive rheumatoid factor ([place]): She has no joint tenderness or synovitis on examination today.  She has not had any signs or symptoms of a rheumatoid arthritis flare since her last office visit.  She has clinically been doing well on Humira 40 mg subcutaneous injections every 14 days.  She has been tolerating Humira without any side effects or injection site reactions.  She has not missed any doses of Humira recently.  She has not been experiencing any morning stiffness, nocturnal pain, or difficulty with ADLs.  She will remain on Humira as monotherapy.  She was advised to notify us if she develops signs or symptoms of a flare.  She will follow-up in the office in 5 months.  High risk medication use - Humira 40 mg sq injections every 14 days. - Plan: CBC with Differential/Platelet, COMPLETE METABOLIC PANEL WITH GFR, QuantiFERON-TB Gold Plus CBC and CMP drawn on 01/08/2021.  She is overdue to update lab work.  Orders for CBC and CMP were released.  Her next lab work will be due in June and every 3 months to monitor for drug toxicity.  TB Gold negative on 08/08/2020.  Order for TB gold released today. Discussed the importance of holding  Humira if she develops signs or symptoms of an infection and to resume once the infection is completely cleared. Discussed the importance of yearly skin examinations while on Humira due to the increased risk for nonmelanoma skin cancers.  Screening for tuberculosis -Order for TB gold released.  Plan: QuantiFERON-TB Gold Plus  Primary osteoarthritis of both hands: She has PIP and DIP thickening consistent with osteoarthritis of both hands.  No tenderness or inflammation was noted on examination today.  Discussed the importance of joint protection and muscle strengthening.  Primary osteoarthritis of both feet: She is not experiencing any increased discomfort in her feet at this time.  She continues to follow-up with her podiatrist on a regular basis.  On examination she had no tenderness over the MTP joints.  Discussed the importance of wearing proper fitting shoes.  Age-related osteoporosis without current pathological fracture - DEXA 01/19/14 left forearm at the 33% radius site, BMD 0.588 T-score -3.3.  I discussed monitoring DEXA but she declined.  She is currently not on any therapy for management of osteoporosis.  History of asthma   Orders: Orders Placed This Encounter  Procedures   CBC with Differential/Platelet   COMPLETE METABOLIC PANEL WITH GFR   QuantiFERON-TB Gold Plus   No orders of the defined types were placed in this encounter.   Follow-Up Instructions: Return in about 5 months (around 12/25/2021) for Rheumatoid arthritis, Osteoarthritis, Osteoporosis.   Ofilia Neas, PA-C  Note - This record has been created using Dragon software.  Chart creation errors have been sought, but may not always  have been located. Such creation errors do not reflect on  the standard of medical care.

## 2021-07-16 ENCOUNTER — Ambulatory Visit (INDEPENDENT_AMBULATORY_CARE_PROVIDER_SITE_OTHER): Payer: BC Managed Care – PPO | Admitting: Family Medicine

## 2021-07-16 ENCOUNTER — Other Ambulatory Visit: Payer: Self-pay

## 2021-07-16 ENCOUNTER — Encounter: Payer: Self-pay | Admitting: Family Medicine

## 2021-07-16 VITALS — BP 150/92 | HR 91 | Temp 98.6°F | Ht 67.5 in | Wt 150.0 lb

## 2021-07-16 DIAGNOSIS — J0110 Acute frontal sinusitis, unspecified: Secondary | ICD-10-CM | POA: Diagnosis not present

## 2021-07-16 MED ORDER — AMOXICILLIN-POT CLAVULANATE 875-125 MG PO TABS: 1.0000 | ORAL_TABLET | Freq: Two times a day (BID) | ORAL | 0 refills | Status: DC

## 2021-07-16 NOTE — Patient Instructions (Signed)
Antibiotic as prescribed.  Check BP at home. If continues to be elevated please let me know.  Take care  Dr. Lacinda Axon

## 2021-07-17 ENCOUNTER — Ambulatory Visit (INDEPENDENT_AMBULATORY_CARE_PROVIDER_SITE_OTHER): Payer: Self-pay | Admitting: *Deleted

## 2021-07-17 VITALS — Ht 67.0 in | Wt 148.0 lb

## 2021-07-17 DIAGNOSIS — J0110 Acute frontal sinusitis, unspecified: Secondary | ICD-10-CM | POA: Insufficient documentation

## 2021-07-17 DIAGNOSIS — Z1211 Encounter for screening for malignant neoplasm of colon: Secondary | ICD-10-CM

## 2021-07-17 NOTE — Progress Notes (Signed)
Subjective:  Patient ID: Dawn Meyer, female    DOB: 1955-12-16  Age: 66 y.o. MRN: [id number]  CC: Chief Complaint  Patient presents with   Sinus Problem    X 4 days , congestion and drainage , taking mucinex     HPI:  66 year old female with rheumatoid arthritis on Humira presents with the above complaint.  Symptoms for 4 days.  She reports sinus congestion and drainage.  Mild cough.  Pain and pressure is predominantly of the left frontal sinus.  She has been taking Mucinex without resolution.  No fever.  No sick contacts.  She continues to smoke but has cut back quite a bit.  No other associated symptoms.  No other complaints.  Patient Active Problem List   Diagnosis Date Noted   Acute frontal sinusitis 07/17/2021   Encounter for preventative adult health care examination 04/11/2021   Allergic rhinitis 11/17/2017   Tobacco abuse 11/17/2017   Rheumatoid arthritis involving multiple sites with positive rheumatoid factor ([place]) 08/18/2016   OP (osteoporosis) 08/18/2016    Social Hx   Social History   Socioeconomic History   Marital status: Married    Spouse name: Not on file   Number of children: Not on file   Years of education: Not on file   Highest education level: Not on file  Occupational History   Not on file  Tobacco Use   Smoking status: Every Day    Packs/day: 0.25    Years: 40.00    Pack years: 10.00    Types: Cigarettes   Smokeless tobacco: Never  Vaping Use   Vaping Use: Never used  Substance and Sexual Activity   Alcohol use: No   Drug use: No   Sexual activity: Not on file  Other Topics Concern   Not on file  Social History Narrative   Not on file   Social Determinants of Health   Financial Resource Strain: Not on file  Food Insecurity: Not on file  Transportation Needs: Not on file  Physical Activity: Not on file  Stress: Not on file  Social Connections: Not on file    Review of Systems Per HPI  Objective:  BP (!) 150/92     Pulse 91    Temp 98.6 F (37 C)    Ht 5' 7.5" (1.715 m)    Wt 150 lb (68 kg)    SpO2 98%    BMI 23.15 kg/m   BP/Weight 07/16/2021 04/11/2021 05/26/7508  Systolic BP 258 527 782  Diastolic BP 92 86 78  Wt. (Lbs) 150 150.8 149  BMI 23.15 23.27 22.99    Physical Exam Vitals and nursing note reviewed.  Constitutional:      General: She is not in acute distress.    Appearance: Normal appearance. She is not ill-appearing.  HENT:     Head: Normocephalic and atraumatic.     Comments: Left frontal sinus tenderness to palpation.    Mouth/Throat:     Pharynx: Oropharynx is clear.  Eyes:     General:        Right eye: No discharge.        Left eye: No discharge.     Conjunctiva/sclera: Conjunctivae normal.  Cardiovascular:     Rate and Rhythm: Normal rate and regular rhythm.  Pulmonary:     Effort: Pulmonary effort is normal.     Breath sounds: Normal breath sounds. No wheezing or rales.  Neurological:     Mental Status: She is alert.  Psychiatric:        Mood and Affect: Mood normal.        Behavior: Behavior normal.    Lab Results  Component Value Date   WBC 7.7 01/08/2021   HGB 13.5 01/08/2021   HCT 39.5 01/08/2021   PLT 164 01/08/2021   GLUCOSE 81 01/08/2021   CHOL 172 01/08/2021   TRIG 122 01/08/2021   HDL 47 01/08/2021   LDLCALC 103 (H) 01/08/2021   ALT 11 01/08/2021   AST 18 01/08/2021   NA 140 01/08/2021   K 3.9 01/08/2021   CL 106 01/08/2021   CREATININE 0.92 01/08/2021   BUN 11 01/08/2021   CO2 22 01/08/2021   TSH 0.702 09/12/2017     Assessment & Plan:   Problem List Items Addressed This Visit       Respiratory   Acute frontal sinusitis - Primary    Given immunosuppression, treating with Augmentin.      Relevant Medications   amoxicillin-clavulanate (AUGMENTIN) 875-125 MG tablet    Meds ordered this encounter  Medications   amoxicillin-clavulanate (AUGMENTIN) 875-125 MG tablet    Sig: Take 1 tablet by mouth 2 (two) times daily.    Dispense:   14 tablet    Refill:  [place]

## 2021-07-17 NOTE — Assessment & Plan Note (Signed)
Given immunosuppression, treating with Augmentin.

## 2021-07-17 NOTE — Progress Notes (Signed)
Gastroenterology Pre-Procedure Review  Request Date: 07/17/2021 Requesting Physician: Dr. Thersa Salt @ [facility], no previous TCS  PATIENT REVIEW QUESTIONS: The patient responded to the following health history questions as indicated:    1. Diabetes Melitis: no 2. Joint replacements in the past 12 months: no 3. Major health problems in the past 3 months: no 4. Has an artificial valve or MVP: no 5. Has a defibrillator: no 6. Has been advised in past to take antibiotics in advance of a procedure like teeth cleaning: no 7. Family history of colon cancer: no  8. Alcohol Use: no 9. Illicit drug Use: no 10. History of sleep apnea: no  11. History of coronary artery or other vascular stents placed within the last 12 months: no 12. History of any prior anesthesia complications: no 13. Body mass index is 23.18 kg/m.    MEDICATIONS & ALLERGIES:    Patient reports the following regarding taking any blood thinners:   Plavix? no Aspirin? no Coumadin? no Brilinta? no Xarelto? no Eliquis? no Pradaxa? no Savaysa? no Effient? no  Patient confirms/reports the following medications:  Current Outpatient Medications  Medication Sig Dispense Refill   albuterol (VENTOLIN HFA) 108 (90 Base) MCG/ACT inhaler INHALE TWO PUFFS INTO THE LUNGS EVERY SIX HOURS AS NEEDED FOR WHEEZING OR SHORTNESS OF BREATH (Patient taking differently: as needed. INHALE TWO PUFFS INTO THE LUNGS EVERY SIX HOURS AS NEEDED FOR WHEEZING OR SHORTNESS OF BREATH) 18 g 3   amoxicillin-clavulanate (AUGMENTIN) 875-125 MG tablet Take 1 tablet by mouth 2 (two) times daily. 14 tablet 0   fexofenadine (ALLEGRA) 180 MG tablet Take 180 mg by mouth as needed for allergies or rhinitis.     HUMIRA PEN 40 MG/0.4ML PNKT INJECT 1 PEN UNDER THE SKIN EVERY 14 DAYS. 6 each 0   NIACINAMIDE PO Take by mouth daily.     Polypodium Leucotomos (HELIOCARE PO) Take by mouth as needed.     No current facility-administered medications for  this visit.    Patient confirms/reports the following allergies:  Allergies  Allergen Reactions   Doxycycline Rash    No orders of the defined types were placed in this encounter.   AUTHORIZATION INFORMATION Primary Insurance: [facility],  [place] #: [id number],  Group #: [id number] Pre-Cert / Josem Kaufmann required: No, not required  SCHEDULE INFORMATION: Procedure has been scheduled as follows:  Date: , Time:   Location: [facility] with Dr. Abbey Chatters  This Gastroenterology Pre-Precedure Review Form is being routed to the following provider(s): Aliene Altes, PA-C

## 2021-07-17 NOTE — Progress Notes (Signed)
Per chart review, apparent history of COPD. Needs OV due to ASA III.

## 2021-07-23 NOTE — Progress Notes (Signed)
Made pt aware ov needed.  She requested in office visit.  Scheduled ov for 07/30/2021 at 1:30 with Neil Crouch, PA-C.

## 2021-07-24 ENCOUNTER — Ambulatory Visit: Payer: BC Managed Care – PPO | Admitting: Gastroenterology

## 2021-07-25 ENCOUNTER — Ambulatory Visit: Payer: BC Managed Care – PPO | Admitting: Physician Assistant

## 2021-07-25 ENCOUNTER — Encounter: Payer: Self-pay | Admitting: Physician Assistant

## 2021-07-25 ENCOUNTER — Other Ambulatory Visit: Payer: Self-pay

## 2021-07-25 VITALS — BP 160/84 | HR 60 | Ht 67.0 in | Wt 153.0 lb

## 2021-07-25 DIAGNOSIS — M19071 Primary osteoarthritis, right ankle and foot: Secondary | ICD-10-CM | POA: Diagnosis not present

## 2021-07-25 DIAGNOSIS — Z111 Encounter for screening for respiratory tuberculosis: Secondary | ICD-10-CM

## 2021-07-25 DIAGNOSIS — M0579 Rheumatoid arthritis with rheumatoid factor of multiple sites without organ or systems involvement: Secondary | ICD-10-CM

## 2021-07-25 DIAGNOSIS — Z79899 Other long term (current) drug therapy: Secondary | ICD-10-CM | POA: Diagnosis not present

## 2021-07-25 DIAGNOSIS — M19041 Primary osteoarthritis, right hand: Secondary | ICD-10-CM | POA: Diagnosis not present

## 2021-07-25 DIAGNOSIS — M19042 Primary osteoarthritis, left hand: Secondary | ICD-10-CM

## 2021-07-25 DIAGNOSIS — Z8709 Personal history of other diseases of the respiratory system: Secondary | ICD-10-CM

## 2021-07-25 DIAGNOSIS — M810 Age-related osteoporosis without current pathological fracture: Secondary | ICD-10-CM

## 2021-07-25 DIAGNOSIS — M19072 Primary osteoarthritis, left ankle and foot: Secondary | ICD-10-CM

## 2021-07-25 NOTE — Patient Instructions (Signed)
Standing Labs ?We placed an order today for your standing lab work.  ? ?Please have your standing labs drawn in June and every 3 months  ? ?If possible, please have your labs drawn 2 weeks prior to your appointment so that the provider can discuss your results at your appointment. ? ?Please note that you may see your imaging and lab results in MyChart before we have reviewed them. ?We may be awaiting multiple results to interpret others before contacting you. ?Please allow our office up to 72 hours to thoroughly review all of the results before contacting the office for clarification of your results. ? ?We have open lab daily: ?Monday through Thursday from 1:30-4:30 PM and Friday from 1:30-4:00 PM ?at the office of Dr. Shaili Deveshwar, [facility].   ?Please be advised, all patients with office appointments requiring lab work will take precedent over walk-in lab work.  ?If possible, please come for your lab work on Monday and Friday afternoons, as you may experience shorter wait times. ?The office is located at [address], [address], [place], [place] [address] ?No appointment is necessary.   ?Labs are drawn by [facility]. Please bring your co-pay at the time of your lab draw.  You may receive a bill from [facility] for your lab work. ? ?Please note if you are on Hydroxychloroquine and and an order has been placed for a Hydroxychloroquine level, you will need to have it drawn 4 hours or more after your last dose. ? ?If you wish to have your labs drawn at another location, please call the office 24 hours in advance to send orders. ? ?If you have any questions regarding directions or hours of operation,  ?please call [phone].   ?As a reminder, please drink plenty of water prior to coming for your lab work. Thanks! ? ?

## 2021-07-26 NOTE — Progress Notes (Signed)
CBC and CMP WNL

## 2021-07-29 LAB — CBC WITH DIFFERENTIAL/PLATELET
Absolute Monocytes: 516 cells/uL (ref 200–950)
Basophils Absolute: 43 cells/uL (ref 0–200)
Basophils Relative: 0.5 %
Eosinophils Absolute: 490 cells/uL (ref 15–500)
Eosinophils Relative: 5.7 %
HCT: 39.9 % (ref 35.0–45.0)
Hemoglobin: 13.2 g/dL (ref 11.7–15.5)
Lymphs Abs: 3638 cells/uL (ref 850–3900)
MCH: 31.2 pg (ref 27.0–33.0)
MCHC: 33.1 g/dL (ref 32.0–36.0)
MCV: 94.3 fL (ref 80.0–100.0)
MPV: 10.3 fL (ref 7.5–12.5)
Monocytes Relative: 6 %
Neutro Abs: 3913 cells/uL (ref 1500–7800)
Neutrophils Relative %: 45.5 %
Platelets: 186 10*3/uL (ref 140–400)
RBC: 4.23 10*6/uL (ref 3.80–5.10)
RDW: 12.1 % (ref 11.0–15.0)
Total Lymphocyte: 42.3 %
WBC: 8.6 10*3/uL (ref 3.8–10.8)

## 2021-07-29 LAB — QUANTIFERON-TB GOLD PLUS
Mitogen-NIL: >10 IU/mL
NIL: 0.32 IU/mL
QuantiFERON-TB Gold Plus: NEGATIVE
TB1-NIL: <0 IU/mL
TB2-NIL: 0.01 IU/mL

## 2021-07-29 LAB — COMPLETE METABOLIC PANEL WITH GFR
AG Ratio: 1.5 (calc) (ref 1.0–2.5)
ALT: 11 U/L (ref 6–29)
AST: 14 U/L (ref 10–35)
Albumin: 4 g/dL (ref 3.6–5.1)
Alkaline phosphatase (APISO): 95 U/L (ref 37–153)
BUN: 18 mg/dL (ref 7–25)
CO2: 26 mmol/L (ref 20–32)
Calcium: 9.3 mg/dL (ref 8.6–10.4)
Chloride: 107 mmol/L (ref 98–110)
Creat: 0.9 mg/dL (ref 0.50–1.05)
Globulin: 2.6 g/dL (calc) (ref 1.9–3.7)
Glucose, Bld: 81 mg/dL (ref 65–99)
Potassium: 3.9 mmol/L (ref 3.5–5.3)
Sodium: 140 mmol/L (ref 135–146)
Total Bilirubin: 0.5 mg/dL (ref 0.2–1.2)
Total Protein: 6.6 g/dL (ref 6.1–8.1)
eGFR: 72 mL/min/{1.73_m2} (ref >=60)

## 2021-07-29 NOTE — Progress Notes (Signed)
Primary Care Physician:  Coral Spikes, DO ? ?Primary Gastroenterologist:  Garfield Cornea, MD ? ? ?Chief Complaint  ?Patient presents with  ? Colonoscopy  ? ? ?HPI:  Dawn Meyer is a 66 y.o. female here at the request of Dr. Lacinda Axon to schedule colonoscopy. She was brought in for evaluation given history of lung disease. There was question of copd in her records. Patient states she has history of asthma, since childhood. Rarely has a flare. Uses rescue inhaler on occasion. There was consideration of testing for copd at one point by new pcp but this was never done. She denies DOE or SOB. She is very active. She denies any bowel concerns. No melena, brbpr. No gerd. No weight loss. No family history of colon cancer. No prior colonoscopy. She is on Humira chronically for RA. ? ? ?Current Outpatient Medications  ?Medication Sig Dispense Refill  ? albuterol (VENTOLIN HFA) 108 (90 Base) MCG/ACT inhaler INHALE TWO PUFFS INTO THE LUNGS EVERY SIX HOURS AS NEEDED FOR WHEEZING OR SHORTNESS OF BREATH (Patient taking differently: as needed. INHALE TWO PUFFS INTO THE LUNGS EVERY SIX HOURS AS NEEDED FOR WHEEZING OR SHORTNESS OF BREATH) 18 g 3  ? fexofenadine (ALLEGRA) 180 MG tablet Take 180 mg by mouth as needed for allergies or rhinitis.    ? HUMIRA PEN 40 MG/0.4ML PNKT INJECT 1 PEN UNDER THE SKIN EVERY 14 DAYS. 6 each 0  ? NIACINAMIDE PO Take by mouth daily.    ? Polypodium Leucotomos (HELIOCARE PO) Take by mouth as needed.    ? ?No current facility-administered medications for this visit.  ? ? ?Allergies as of 07/30/2021 - Review Complete 07/30/2021  ?Allergen Reaction Noted  ? Doxycycline Rash 08/18/2016  ? ? ?Past Medical History:  ?Diagnosis Date  ? Asthma   ? RA (rheumatoid arthritis) ([place])   ? ? ?Past Surgical History:  ?Procedure Laterality Date  ? MOHS SURGERY    ? SQUAMOUS CELL CARCINOMA EXCISION    ? VAGINAL DELIVERY    ? ? ?Family History  ?Problem Relation Age of Onset  ? Congestive Heart Failure Mother   ? Heart  attack Mother   ? Diabetes Father   ? Heart Problems Father   ?     pacemaker  ? Diverticulitis Sister   ? Hypertension Sister   ? Heart attack Brother   ? Healthy Daughter   ? Colon cancer Neg Hx   ? ? ?Social History  ? ?Socioeconomic History  ? Marital status: Married  ?  Spouse name: Not on file  ? Number of children: Not on file  ? Years of education: Not on file  ? Highest education level: Not on file  ?Occupational History  ? Not on file  ?Tobacco Use  ? Smoking status: Every Day  ?  Packs/day: 0.25  ?  Years: 40.00  ?  Pack years: 10.00  ?  Types: Cigarettes  ?  Passive exposure: Never  ? Smokeless tobacco: Never  ?Vaping Use  ? Vaping Use: Never used  ?Substance and Sexual Activity  ? Alcohol use: No  ? Drug use: No  ? Sexual activity: Not on file  ?Other Topics Concern  ? Not on file  ?Social History Narrative  ? Not on file  ? ?Social Determinants of Health  ? ?Financial Resource Strain: Not on file  ?Food Insecurity: Not on file  ?Transportation Needs: Not on file  ?Physical Activity: Not on file  ?Stress: Not on file  ?Social Connections:  Not on file  ?Intimate Partner Violence: Not on file  ? ? ?  ?ROS: ? ?General: Negative for anorexia, weight loss, fever, chills, fatigue, weakness. ?Eyes: Negative for vision changes.  ?ENT: Negative for hoarseness, difficulty swallowing , nasal congestion. ?CV: Negative for chest pain, angina, palpitations, dyspnea on exertion, peripheral edema.  ?Respiratory: Negative for dyspnea at rest, dyspnea on exertion, cough, sputum, wheezing.  ?GI: See history of present illness. ?GU:  Negative for dysuria, hematuria, urinary incontinence, urinary frequency, nocturnal urination.  ?MS: Negative for joint pain, low back pain.  ?Derm: Negative for rash or itching.  History of skin cancers. ?Neuro: Negative for weakness, abnormal sensation, seizure, frequent headaches, memory loss, confusion.  ?Psych: Negative for anxiety, depression, suicidal ideation, hallucinations.  ?Endo:  Negative for unusual weight change.  ?Heme: Negative for bruising or bleeding. ?Allergy: Negative for rash or hives. ?  ? ?Physical Examination: ? ?BP (!) 175/85 (BP Location: Right Arm, Patient Position: Sitting, Cuff Size: Normal)   Pulse 91   Temp 98 ?F (36.7 ?C) (Temporal)   Ht '5\' 7"'$  (1.702 m)   Wt 153 lb (69.4 kg)   SpO2 98%   BMI 23.96 kg/m?  ?  ?General: Well-nourished, well-developed in no acute distress.  ?Head: Normocephalic, atraumatic.   ?Eyes: Conjunctiva pink, no icterus. ?Mouth: Oropharyngeal mucosa moist and pink , no lesions erythema or exudate. ?Neck: Supple without thyromegaly, masses, or lymphadenopathy.  ?Lungs: Clear to auscultation bilaterally.  ?Heart: Regular rate and rhythm, no murmurs rubs or gallops.  ?Abdomen: Bowel sounds are normal, nontender, nondistended, no hepatosplenomegaly or masses, no abdominal bruits or    hernia , no rebound or guarding.   ?Rectal: not performed ?Extremities: No lower extremity edema. No clubbing or deformities.  ?Neuro: Alert and oriented x 4 , grossly normal neurologically.  ?Skin: Warm and dry, no rash or jaundice.   ?Psych: Alert and cooperative, normal mood and affect. ? ?Labs: ?Lab Results  ?Component Value Date  ? CREATININE 0.90 07/25/2021  ? BUN 18 07/25/2021  ? NA 140 07/25/2021  ? K 3.9 07/25/2021  ? CL 107 07/25/2021  ? CO2 26 07/25/2021  ? ?Lab Results  ?Component Value Date  ? ALT 11 07/25/2021  ? AST 14 07/25/2021  ? ALKPHOS 104 01/08/2021  ? BILITOT 0.5 07/25/2021  ? ?Lab Results  ?Component Value Date  ? WBC 8.6 07/25/2021  ? HGB 13.2 07/25/2021  ? HCT 39.9 07/25/2021  ? MCV 94.3 07/25/2021  ? PLT 186 07/25/2021  ? ? ? ?Imaging Studies: ?No results found. ? ? ?Assessment: ? ?Very pleasant 66 year old female presenting to schedule first-ever colonoscopy.  No family history of colon cancer.  No bowel concerns.  She has a history of asthma which is well controlled. ? ?Elevated blood pressure today, patient states her PCP has been  monitoring this. So far has been elevated when she was seen for a sick visit and plans for recheck during upcoming well visit. Plans to get a blood pressure cuff to monitor at home. ? ?Plan: ?Colonoscopy with propofol. ASA 2.  I have discussed the risks, alternatives, benefits with regards to but not limited to the risk of reaction to medication, bleeding, infection, perforation and the patient is agreeable to proceed. Written consent to be obtained. ?She will get check of BP at home and if remain elevated she will need to discuss with PCP prior to upcoming colonoscopy. ? ? ?

## 2021-07-29 NOTE — H&P (View-Only) (Signed)
Primary Care Physician:  Coral Spikes, DO ? ?Primary Gastroenterologist:  Garfield Cornea, MD ? ? ?Chief Complaint  ?Patient presents with  ? Colonoscopy  ? ? ?HPI:  Dawn Meyer is a 66 y.o. female here at the request of Dr. Lacinda Axon to schedule colonoscopy. She was brought in for evaluation given history of lung disease. There was question of copd in her records. Patient states she has history of asthma, since childhood. Rarely has a flare. Uses rescue inhaler on occasion. There was consideration of testing for copd at one point by new pcp but this was never done. She denies DOE or SOB. She is very active. She denies any bowel concerns. No melena, brbpr. No gerd. No weight loss. No family history of colon cancer. No prior colonoscopy. She is on Humira chronically for RA. ? ? ?Current Outpatient Medications  ?Medication Sig Dispense Refill  ? albuterol (VENTOLIN HFA) 108 (90 Base) MCG/ACT inhaler INHALE TWO PUFFS INTO THE LUNGS EVERY SIX HOURS AS NEEDED FOR WHEEZING OR SHORTNESS OF BREATH (Patient taking differently: as needed. INHALE TWO PUFFS INTO THE LUNGS EVERY SIX HOURS AS NEEDED FOR WHEEZING OR SHORTNESS OF BREATH) 18 g 3  ? fexofenadine (ALLEGRA) 180 MG tablet Take 180 mg by mouth as needed for allergies or rhinitis.    ? HUMIRA PEN 40 MG/0.4ML PNKT INJECT 1 PEN UNDER THE SKIN EVERY 14 DAYS. 6 each 0  ? NIACINAMIDE PO Take by mouth daily.    ? Polypodium Leucotomos (HELIOCARE PO) Take by mouth as needed.    ? ?No current facility-administered medications for this visit.  ? ? ?Allergies as of 07/30/2021 - Review Complete 07/30/2021  ?Allergen Reaction Noted  ? Doxycycline Rash 08/18/2016  ? ? ?Past Medical History:  ?Diagnosis Date  ? Asthma   ? RA (rheumatoid arthritis) ([place])   ? ? ?Past Surgical History:  ?Procedure Laterality Date  ? MOHS SURGERY    ? SQUAMOUS CELL CARCINOMA EXCISION    ? VAGINAL DELIVERY    ? ? ?Family History  ?Problem Relation Age of Onset  ? Congestive Heart Failure Mother   ? Heart  attack Mother   ? Diabetes Father   ? Heart Problems Father   ?     pacemaker  ? Diverticulitis Sister   ? Hypertension Sister   ? Heart attack Brother   ? Healthy Daughter   ? Colon cancer Neg Hx   ? ? ?Social History  ? ?Socioeconomic History  ? Marital status: Married  ?  Spouse name: Not on file  ? Number of children: Not on file  ? Years of education: Not on file  ? Highest education level: Not on file  ?Occupational History  ? Not on file  ?Tobacco Use  ? Smoking status: Every Day  ?  Packs/day: 0.25  ?  Years: 40.00  ?  Pack years: 10.00  ?  Types: Cigarettes  ?  Passive exposure: Never  ? Smokeless tobacco: Never  ?Vaping Use  ? Vaping Use: Never used  ?Substance and Sexual Activity  ? Alcohol use: No  ? Drug use: No  ? Sexual activity: Not on file  ?Other Topics Concern  ? Not on file  ?Social History Narrative  ? Not on file  ? ?Social Determinants of Health  ? ?Financial Resource Strain: Not on file  ?Food Insecurity: Not on file  ?Transportation Needs: Not on file  ?Physical Activity: Not on file  ?Stress: Not on file  ?Social Connections:  Not on file  ?Intimate Partner Violence: Not on file  ? ? ?  ?ROS: ? ?General: Negative for anorexia, weight loss, fever, chills, fatigue, weakness. ?Eyes: Negative for vision changes.  ?ENT: Negative for hoarseness, difficulty swallowing , nasal congestion. ?CV: Negative for chest pain, angina, palpitations, dyspnea on exertion, peripheral edema.  ?Respiratory: Negative for dyspnea at rest, dyspnea on exertion, cough, sputum, wheezing.  ?GI: See history of present illness. ?GU:  Negative for dysuria, hematuria, urinary incontinence, urinary frequency, nocturnal urination.  ?MS: Negative for joint pain, low back pain.  ?Derm: Negative for rash or itching.  History of skin cancers. ?Neuro: Negative for weakness, abnormal sensation, seizure, frequent headaches, memory loss, confusion.  ?Psych: Negative for anxiety, depression, suicidal ideation, hallucinations.  ?Endo:  Negative for unusual weight change.  ?Heme: Negative for bruising or bleeding. ?Allergy: Negative for rash or hives. ?  ? ?Physical Examination: ? ?BP (!) 175/85 (BP Location: Right Arm, Patient Position: Sitting, Cuff Size: Normal)   Pulse 91   Temp 98 ?F (36.7 ?C) (Temporal)   Ht '5\' 7"'$  (1.702 m)   Wt 153 lb (69.4 kg)   SpO2 98%   BMI 23.96 kg/m?  ?  ?General: Well-nourished, well-developed in no acute distress.  ?Head: Normocephalic, atraumatic.   ?Eyes: Conjunctiva pink, no icterus. ?Mouth: Oropharyngeal mucosa moist and pink , no lesions erythema or exudate. ?Neck: Supple without thyromegaly, masses, or lymphadenopathy.  ?Lungs: Clear to auscultation bilaterally.  ?Heart: Regular rate and rhythm, no murmurs rubs or gallops.  ?Abdomen: Bowel sounds are normal, nontender, nondistended, no hepatosplenomegaly or masses, no abdominal bruits or    hernia , no rebound or guarding.   ?Rectal: not performed ?Extremities: No lower extremity edema. No clubbing or deformities.  ?Neuro: Alert and oriented x 4 , grossly normal neurologically.  ?Skin: Warm and dry, no rash or jaundice.   ?Psych: Alert and cooperative, normal mood and affect. ? ?Labs: ?Lab Results  ?Component Value Date  ? CREATININE 0.90 07/25/2021  ? BUN 18 07/25/2021  ? NA 140 07/25/2021  ? K 3.9 07/25/2021  ? CL 107 07/25/2021  ? CO2 26 07/25/2021  ? ?Lab Results  ?Component Value Date  ? ALT 11 07/25/2021  ? AST 14 07/25/2021  ? ALKPHOS 104 01/08/2021  ? BILITOT 0.5 07/25/2021  ? ?Lab Results  ?Component Value Date  ? WBC 8.6 07/25/2021  ? HGB 13.2 07/25/2021  ? HCT 39.9 07/25/2021  ? MCV 94.3 07/25/2021  ? PLT 186 07/25/2021  ? ? ? ?Imaging Studies: ?No results found. ? ? ?Assessment: ? ?Very pleasant 66 year old female presenting to schedule first-ever colonoscopy.  No family history of colon cancer.  No bowel concerns.  She has a history of asthma which is well controlled. ? ?Elevated blood pressure today, patient states her PCP has been  monitoring this. So far has been elevated when she was seen for a sick visit and plans for recheck during upcoming well visit. Plans to get a blood pressure cuff to monitor at home. ? ?Plan: ?Colonoscopy with propofol. ASA 2.  I have discussed the risks, alternatives, benefits with regards to but not limited to the risk of reaction to medication, bleeding, infection, perforation and the patient is agreeable to proceed. Written consent to be obtained. ?She will get check of BP at home and if remain elevated she will need to discuss with PCP prior to upcoming colonoscopy. ? ? ?

## 2021-07-30 ENCOUNTER — Telehealth: Payer: Self-pay | Admitting: *Deleted

## 2021-07-30 ENCOUNTER — Encounter: Payer: Self-pay | Admitting: *Deleted

## 2021-07-30 ENCOUNTER — Other Ambulatory Visit: Payer: Self-pay

## 2021-07-30 ENCOUNTER — Encounter: Payer: Self-pay | Admitting: Gastroenterology

## 2021-07-30 ENCOUNTER — Ambulatory Visit: Payer: BC Managed Care – PPO | Admitting: Gastroenterology

## 2021-07-30 DIAGNOSIS — Z1211 Encounter for screening for malignant neoplasm of colon: Secondary | ICD-10-CM | POA: Insufficient documentation

## 2021-07-30 DIAGNOSIS — J45909 Unspecified asthma, uncomplicated: Secondary | ICD-10-CM

## 2021-07-30 MED ORDER — PEG 3350-KCL-NA BICARB-NACL 420 G PO SOLR: ORAL | 0 refills | Status: DC

## 2021-07-30 NOTE — Telephone Encounter (Signed)
Returned call to pt, she was just wanting confirmation that everything was in order with her copay card. Informed her that her funds should have been reloaded now that it was the new year and (as far as I could tell from the [employer] portal) everything appeared to be good to go. She stated that she was going to go ahead and request a refill of her medication, I requested that she inquire about the copay while they are filling it and if it is higher than expected to verify that they ARE using the copay card and to let us know if there are any issues otherwise. Pt verbalized understanding to all. Nothing additional needs to be done at this time. ?

## 2021-07-30 NOTE — Telephone Encounter (Signed)
Patient is requesting return call regarding co-pay card for Humira. Thank you. ?

## 2021-07-30 NOTE — Patient Instructions (Addendum)
Colonoscopy to be scheduled. See separate instructions.  ?Please plan to monitor your blood pressures at home and keep a log. If you blood pressure remains over 150/90 consistently, please address with Dr. Lacinda Axon BEFORE upcoming colonoscopy.  ?

## 2021-07-30 NOTE — Addendum Note (Signed)
Addended by: Cheron Every on: 07/30/2021 02:20 PM ? ? Modules accepted: Orders ? ?

## 2021-07-30 NOTE — Progress Notes (Signed)
TB gold negative

## 2021-07-31 ENCOUNTER — Other Ambulatory Visit: Payer: Self-pay | Admitting: Family Medicine

## 2021-07-31 ENCOUNTER — Telehealth: Payer: Self-pay | Admitting: Family Medicine

## 2021-07-31 MED ORDER — AMLODIPINE BESYLATE 5 MG PO TABS: 5.0000 mg | ORAL_TABLET | Freq: Every day | ORAL | 0 refills | Status: DC

## 2021-07-31 NOTE — Telephone Encounter (Signed)
Patient states at her last visit her blood pressure was running high. She went to see her gastroenterologist yesterday and it was running high again. She would like to know if Dr. Lacinda Axon would start her on a low dose blood pressure medication. She states they talked a little about this at her last visit. ? ?CB# [phone] ?

## 2021-07-31 NOTE — Telephone Encounter (Signed)
Patient has been informed per drs orders , she verbalizes understanding.  ?

## 2021-08-09 ENCOUNTER — Ambulatory Visit (HOSPITAL_COMMUNITY): Payer: BC Managed Care – PPO | Admitting: Certified Registered Nurse Anesthetist

## 2021-08-09 ENCOUNTER — Ambulatory Visit (HOSPITAL_COMMUNITY)
Admission: RE | Admit: 2021-08-09 | Discharge: 2021-08-09 | Disposition: A | Discharge status: Home / Self Care | Payer: BC Managed Care – PPO | Source: Ambulatory Visit | Attending: Internal Medicine | Admitting: Internal Medicine

## 2021-08-09 ENCOUNTER — Encounter (HOSPITAL_COMMUNITY): Payer: Self-pay | Admitting: Internal Medicine

## 2021-08-09 ENCOUNTER — Other Ambulatory Visit: Payer: Self-pay

## 2021-08-09 ENCOUNTER — Encounter (HOSPITAL_COMMUNITY)
Admission: RE | Disposition: A | Discharge status: Home / Self Care | Payer: Self-pay | Source: Ambulatory Visit | Attending: Internal Medicine

## 2021-08-09 DIAGNOSIS — F17210 Nicotine dependence, cigarettes, uncomplicated: Secondary | ICD-10-CM | POA: Insufficient documentation

## 2021-08-09 DIAGNOSIS — K640 First degree hemorrhoids: Secondary | ICD-10-CM | POA: Diagnosis not present

## 2021-08-09 DIAGNOSIS — J45909 Unspecified asthma, uncomplicated: Secondary | ICD-10-CM | POA: Insufficient documentation

## 2021-08-09 DIAGNOSIS — Z1211 Encounter for screening for malignant neoplasm of colon: Secondary | ICD-10-CM | POA: Diagnosis present

## 2021-08-09 DIAGNOSIS — Z79899 Other long term (current) drug therapy: Secondary | ICD-10-CM | POA: Diagnosis not present

## 2021-08-09 DIAGNOSIS — M069 Rheumatoid arthritis, unspecified: Secondary | ICD-10-CM | POA: Diagnosis not present

## 2021-08-09 DIAGNOSIS — Z79620 Long term (current) use of immunosuppressive biologic: Secondary | ICD-10-CM | POA: Insufficient documentation

## 2021-08-09 HISTORY — PX: COLONOSCOPY WITH PROPOFOL: SHX5780

## 2021-08-09 SURGERY — COLONOSCOPY WITH PROPOFOL
Anesthesia: General

## 2021-08-09 MED ORDER — PROPOFOL 500 MG/50ML IV EMUL
INTRAVENOUS | Status: DC | PRN
  Administered 2021-08-09: 150 ug/kg/min via INTRAVENOUS

## 2021-08-09 MED ORDER — LIDOCAINE HCL (CARDIAC) PF 100 MG/5ML IV SOSY
PREFILLED_SYRINGE | INTRAVENOUS | Status: DC | PRN
  Administered 2021-08-09: 50 mg via INTRAVENOUS

## 2021-08-09 MED ORDER — LACTATED RINGERS IV SOLN: INTRAVENOUS | Status: DC

## 2021-08-09 MED ORDER — LACTATED RINGERS IV SOLN
INTRAVENOUS | Status: DC | PRN
Start: 2021-08-09 — End: 2021-08-09

## 2021-08-09 MED ORDER — PROPOFOL 10 MG/ML IV BOLUS
INTRAVENOUS | Status: DC | PRN
  Administered 2021-08-09: 100 mg via INTRAVENOUS
  Administered 2021-08-09: 50 mg via INTRAVENOUS

## 2021-08-09 NOTE — Anesthesia Preprocedure Evaluation (Signed)
Anesthesia Evaluation  ?Patient identified by MRN, date of birth, ID band ?Patient awake ? ? ? ?Reviewed: ?Allergy & Precautions, H&P , NPO status , Patient's Chart, lab work & pertinent test results, reviewed documented beta blocker date and time  ? ?Airway ?Mallampati: II ? ?TM Distance: >3 FB ?Neck ROM: full ? ? ? Dental ?no notable dental hx. ? ?  ?Pulmonary ?asthma , Current Smoker,  ?  ?Pulmonary exam normal ?breath sounds clear to auscultation ? ? ? ? ? ? Cardiovascular ?Exercise Tolerance: Good ?negative cardio ROS ? ? ?Rhythm:regular Rate:Normal ? ? ?  ?Neuro/Psych ?negative neurological ROS ? negative psych ROS  ? GI/Hepatic ?negative GI ROS, Neg liver ROS,   ?Endo/Other  ?negative endocrine ROS ? Renal/GU ?negative Renal ROS  ?negative genitourinary ?  ?Musculoskeletal ? ?(+) Arthritis , Rheumatoid disorders,   ? Abdominal ?  ?Peds ? Hematology ?negative hematology ROS ?(+)   ?Anesthesia Other Findings ? ? Reproductive/Obstetrics ?negative OB ROS ? ?  ? ? ? ? ? ? ? ? ? ? ? ? ? ?  ?  ? ? ? ? ? ? ? ? ?Anesthesia Physical ?Anesthesia Plan ? ?ASA: 2 ? ?Anesthesia Plan: General  ? ?Post-op Pain Management:   ? ?Induction:  ? ?PONV Risk Score and Plan: Propofol infusion ? ?Airway Management Planned:  ? ?Additional Equipment:  ? ?Intra-op Plan:  ? ?Post-operative Plan:  ? ?Informed Consent: I have reviewed the patients History and Physical, chart, labs and discussed the procedure including the risks, benefits and alternatives for the proposed anesthesia with the patient or authorized representative who has indicated his/her understanding and acceptance.  ? ? ? ?Dental Advisory Given ? ?Plan Discussed with: CRNA ? ?Anesthesia Plan Comments:   ? ? ? ? ? ? ?Anesthesia Quick Evaluation ? ?

## 2021-08-09 NOTE — Interval H&P Note (Signed)
History and Physical Interval Note: ? ?08/09/2021 ?7:28 AM ? ?Dawn Meyer  has presented today for surgery, with the diagnosis of screening.  The various methods of treatment have been discussed with the patient and family. After consideration of risks, benefits and other options for treatment, the patient has consented to  Procedure(s) with comments: ?COLONOSCOPY WITH PROPOFOL (N/A) - 7:30am as a surgical intervention.  The patient's history has been reviewed, patient examined, no change in status, stable for surgery.  I have reviewed the patient's chart and labs.  Questions were answered to the patient's satisfaction.   ? ? ?Dawn Meyer ? ?No change.  First ever screening colonoscopy today per plan. ?The risks, benefits, limitations, alternatives and imponderables have been reviewed with the patient. Questions have been answered. All parties are agreeable.   ?

## 2021-08-09 NOTE — Transfer of Care (Signed)
Immediate Anesthesia Transfer of Care Note ? ?Patient: Dawn Meyer ? ?Procedure(s) Performed: COLONOSCOPY WITH PROPOFOL ? ?Patient Location: Endoscopy Unit ? ?Anesthesia Type:General ? ?Level of Consciousness: awake ? ?Airway & Oxygen Therapy: Patient Spontanous Breathing ? ?Post-op Assessment: Report given to RN and Post -op Vital signs reviewed and stable ? ?Post vital signs: Reviewed and stable ? ?Last Vitals:  ?Vitals Value Taken Time  ?BP 97/46   ?Temp    ?Pulse 72   ?Resp 14   ?SpO2 92%   ? ? ?Last Pain:  ?Vitals:  ? 08/09/21 0736  ?TempSrc:   ?PainSc: 0-No pain  ?   ? ?Patients Stated Pain Goal: 7 (08/09/21 5726) ? ?Complications: No notable events documented. ?

## 2021-08-09 NOTE — Op Note (Signed)
[facility] ?Patient Name: Dawn Meyer ?Procedure Date: 08/09/2021 7:16 AM ?MRN: [id number] ?Date of Birth: [birth year] ?Attending MD: Norvel Richards , MD ?CSN: [id number] ?Age: 66 ?Admit Type: Outpatient ?Procedure:                Colonoscopy ?Indications:              Screening for colorectal malignant neoplasm ?Providers:                Norvel Richards, MD, Caprice Kluver, Suzan Garibaldi.  ?                          Risa Grill, Technician ?Referring MD:              ?Medicines:                Propofol per Anesthesia ?Complications:            No immediate complications. ?Estimated Blood Loss:     Estimated blood loss: none. ?Procedure:                Pre-Anesthesia Assessment: ?                          - Prior to the procedure, a History and Physical  ?                          was performed, and patient medications and  ?                          allergies were reviewed. The patient's tolerance of  ?                          previous anesthesia was also reviewed. The risks  ?                          and benefits of the procedure and the sedation  ?                          options and risks were discussed with the patient.  ?                          All questions were answered, and informed consent  ?                          was obtained. Prior Anticoagulants: The patient has  ?                          taken no previous anticoagulant or antiplatelet  ?                          agents. ASA Grade Assessment: II - A patient with  ?                          mild systemic disease. After reviewing the risks  ?  and benefits, the patient was deemed in  ?                          satisfactory condition to undergo the procedure. ?                          After obtaining informed consent, the colonoscope  ?                          was passed under direct vision. Throughout the  ?                          procedure, the patient's blood pressure, pulse, and  ?                           oxygen saturations were monitored continuously. The  ?                          [id number]) scope was introduced through the  ?                          anus and advanced to the the cecum, identified by  ?                          appendiceal orifice and ileocecal valve. The  ?                          colonoscopy was performed without difficulty. The  ?                          patient tolerated the procedure well. The quality  ?                          of the bowel preparation was adequate. The  ?                          ileocecal valve, appendiceal orifice, and rectum  ?                          were photographed. The entire colon was well  ?                          visualized. ?Scope In: 7:40:11 AM ?Scope Out: 7:55:42 AM ?Scope Withdrawal Time: 0 hours 9 minutes 36 seconds  ?Total Procedure Duration: 0 hours 15 minutes 31 seconds  ?Findings: ?     The perianal and digital rectal examinations were normal. ?     Non-bleeding internal hemorrhoids were found. The hemorrhoids were mild,  ?     small and Grade I (internal hemorrhoids that do not prolapse). ?     The entire examined colon appeared normal. ?     No additional abnormalities were found on retroflexion. ?Impression:               - Non-bleeding internal hemorrhoids. ?                          -  The entire examined colon is normal. ?                          - No specimens collected. ?Moderate Sedation: ?     Moderate (conscious) sedation was personally administered by an  ?     anesthesia professional. The following parameters were monitored: oxygen  ?     saturation, heart rate, blood pressure, respiratory rate, EKG, adequacy  ?     of pulmonary ventilation, and response to care. ?Recommendation:           - Patient has a contact number available for  ?                          emergencies. The signs and symptoms of potential  ?                          delayed complications were discussed with the  ?                          patient. Return  to normal activities tomorrow.  ?                          Written discharge instructions were provided to the  ?                          patient. ?                          - Advance diet as tolerated. ?Procedure Code(s):        --- Professional --- ?                          [phone], Colonoscopy, flexible; diagnostic, including  ?                          collection of specimen(s) by brushing or washing,  ?                          when performed (separate procedure) ?Diagnosis Code(s):        --- Professional --- ?                          Z12.11, Encounter for screening for malignant  ?                          neoplasm of colon ?                          K64.0, First degree hemorrhoids ?CPT copyright 2019 [employer]. All rights reserved. ?The codes documented in this report are preliminary and upon coder review may  ?be revised to meet current compliance requirements. ?Cristopher Estimable. Sorina Derrig, MD ?Norvel Richards, MD ?08/09/2021 8:06:08 AM ?This report has been signed electronically. ?Number of Addenda: 0 ?

## 2021-08-09 NOTE — Anesthesia Postprocedure Evaluation (Signed)
Anesthesia Post Note ? ?Patient: Dawn Meyer ? ?Procedure(s) Performed: COLONOSCOPY WITH PROPOFOL ? ?Patient location during evaluation: Phase II ?Anesthesia Type: General ?Level of consciousness: awake ?Pain management: pain level controlled ?Vital Signs Assessment: post-procedure vital signs reviewed and stable ?Respiratory status: spontaneous breathing and respiratory function stable ?Cardiovascular status: blood pressure returned to baseline and stable ?Postop Assessment: no headache and no apparent nausea or vomiting ?Anesthetic complications: no ?Comments: Late entry ? ? ?No notable events documented. ? ? ?Last Vitals:  ?Vitals:  ? 08/09/21 0759 08/09/21 0804  ?BP: (!) 97/46 (!) 121/56  ?Pulse: 75 77  ?Resp: 18 20  ?Temp: 36.6 ?C   ?SpO2: 96% 96%  ?  ?Last Pain:  ?Vitals:  ? 08/09/21 0804  ?TempSrc:   ?PainSc: 0-No pain  ? ? ?  ?  ?  ?  ?  ?  ? ?Louann Sjogren ? ? ? ? ?

## 2021-08-09 NOTE — Discharge Instructions (Signed)
?  Colonoscopy ?Discharge Instructions ? ?Read the instructions outlined below and refer to this sheet in the next few weeks. These discharge instructions provide you with general information on caring for yourself after you leave the hospital. Your doctor may also give you specific instructions. While your treatment has been planned according to the most current medical practices available, unavoidable complications occasionally occur. If you have any problems or questions after discharge, call Dr. Gala Romney at [phone]. ?ACTIVITY ?You may resume your regular activity, but move at a slower pace for the next 24 hours.  ?Take frequent rest periods for the next 24 hours.  ?Walking will help get rid of the air and reduce the bloated feeling in your belly (abdomen).  ?No driving for 24 hours (because of the medicine (anesthesia) used during the test).   ?Do not sign any important legal documents or operate any machinery for 24 hours (because of the anesthesia used during the test).  ?NUTRITION ?Drink plenty of fluids.  ?You may resume your normal diet as instructed by your doctor.  ?Begin with a light meal and progress to your normal diet. Heavy or fried foods are harder to digest and may make you feel sick to your stomach (nauseated).  ?Avoid alcoholic beverages for 24 hours or as instructed.  ?MEDICATIONS ?You may resume your normal medications unless your doctor tells you otherwise.  ?WHAT YOU CAN EXPECT TODAY ?Some feelings of bloating in the abdomen.  ?Passage of more gas than usual.  ?Spotting of blood in your stool or on the toilet paper.  ?IF YOU HAD POLYPS REMOVED DURING THE COLONOSCOPY: ?No aspirin products for 7 days or as instructed.  ?No alcohol for 7 days or as instructed.  ?Eat a soft diet for the next 24 hours.  ?FINDING OUT THE RESULTS OF YOUR TEST ?Not all test results are available during your visit. If your test results are not back during the visit, make an appointment with your caregiver to find out the  results. Do not assume everything is normal if you have not heard from your caregiver or the medical facility. It is important for you to follow up on all of your test results.  ?SEEK IMMEDIATE MEDICAL ATTENTION IF: ?You have more than a spotting of blood in your stool.  ?Your belly is swollen (abdominal distention).  ?You are nauseated or vomiting.  ?You have a temperature over 101.  ?You have abdominal pain or discomfort that is severe or gets worse throughout the day.   ? ? ?No polyps found today ? ?Recommend repeat screening colonoscopy in 10 years ? ?At patient request, I called Cleatis Polka at [phone]-call rolled to voicemail; left a message ?

## 2021-08-14 ENCOUNTER — Encounter (HOSPITAL_COMMUNITY): Payer: Self-pay | Admitting: Internal Medicine

## 2021-08-19 ENCOUNTER — Ambulatory Visit: Payer: BC Managed Care – PPO | Admitting: Gastroenterology

## 2021-09-05 ENCOUNTER — Ambulatory Visit: Payer: BC Managed Care – PPO | Admitting: Podiatry

## 2021-09-05 DIAGNOSIS — Q828 Other specified congenital malformations of skin: Secondary | ICD-10-CM | POA: Diagnosis not present

## 2021-09-08 NOTE — Progress Notes (Signed)
Subjective: ?66 year old female presents the office with concern of bilateral foot pain, callus formation.  She states the calluses are starting to hurt again.  No swelling or redness or any drainage or any open lesions.  No other concerns. ? ?Objective: ?AAO x3, NAD ?DP/PT pulses palpable bilaterally, CRT less than 3 seconds ?Hyperkeratotic lesions noted submetatarsal 3 on the right foot as well as left plantar hallux.  Also callus formation present submetatarsal 5 on the left foot.  There is no underlying ulceration drainage or any signs of infection noted today.  Prominent metatarsal head.  Bunion present.  Hallux extensors left side.  ?No pain with calf compression, swelling, warmth, erythema ? ?Assessment: ?Hyperkeratotic lesions to digital deformity, prominent metatarsal heads ? ?Plan: ?-All treatment options discussed with the patient including all alternatives, risks, complications.  ?-Sharply debrided the hyperkeratotic lesions with any complications x 3. Continue moisturizer and offloading daily.  Discussed offloading pads and shoe modifications.  Monitoring skin breakdown or signs of infection. ?-Patient encouraged to call the office with any questions, concerns, change in symptoms.  ? ?Trula Slade DPM ? ? ?

## 2021-10-10 ENCOUNTER — Encounter: Payer: Self-pay | Admitting: Family Medicine

## 2021-10-10 ENCOUNTER — Ambulatory Visit: Payer: BC Managed Care – PPO | Admitting: Family Medicine

## 2021-10-10 DIAGNOSIS — M818 Other osteoporosis without current pathological fracture: Secondary | ICD-10-CM

## 2021-10-10 DIAGNOSIS — I10 Essential (primary) hypertension: Secondary | ICD-10-CM | POA: Diagnosis not present

## 2021-10-10 DIAGNOSIS — Z720 Tobacco use: Secondary | ICD-10-CM | POA: Diagnosis not present

## 2021-10-10 DIAGNOSIS — M0579 Rheumatoid arthritis with rheumatoid factor of multiple sites without organ or systems involvement: Secondary | ICD-10-CM

## 2021-10-10 MED ORDER — AMLODIPINE BESYLATE 5 MG PO TABS: 5.0000 mg | ORAL_TABLET | Freq: Every day | ORAL | 3 refills | Status: DC

## 2021-10-10 MED ORDER — ALBUTEROL SULFATE HFA 108 (90 BASE) MCG/ACT IN AERS: INHALATION_SPRAY | RESPIRATORY_TRACT | 3 refills | Status: DC

## 2021-10-10 NOTE — Assessment & Plan Note (Signed)
Stable at this time on Humira.  Continue close follow-up with rheumatology.

## 2021-10-10 NOTE — Assessment & Plan Note (Signed)
Patient continues to decline treatment.

## 2021-10-10 NOTE — Assessment & Plan Note (Signed)
Stable. °-Continue amlodipine °

## 2021-10-10 NOTE — Assessment & Plan Note (Signed)
Advised patient that she needs to quit.

## 2021-10-10 NOTE — Patient Instructions (Signed)
Continue working on smoking cessation.  Continue your medications.  See OBGYN for pap smear and mammogram.  Follow up in 6 months.  Take care  Dr. Lacinda Axon

## 2021-10-10 NOTE — Progress Notes (Signed)
Subjective:  Patient ID: Dawn Meyer, female    DOB: [birth year]  Age: 66 y.o. MRN: [id number]  CC: Chief Complaint  Patient presents with   Follow-up    HPI:  66 year old female with tobacco abuse, osteoporosis, rheumatoid arthritis, hypertension presents for follow-up.  HTN Well controlled on amlodipine.  Patient is doing well at this time.  Osteoporosis Discussed this today.  Declines treatment at this time.  Rheumatoid arthritis Managed by rheumatology.  Stable on Humira.  Tobacco abuse Smoking about 8 cigarettes a day.  Advised need to cut back and quit.  Preventative healthcare Recently had a colonoscopy.  Advised patient that she is due for mammogram and Pap smear as well as tetanus and shingles vaccination.  Patient states that she knows that she needs to see her OB/GYN.  She will do this in the near future.  Patient Active Problem List   Diagnosis Date Noted   Essential hypertension 10/10/2021   Asthma    Allergic rhinitis 11/17/2017   Tobacco abuse 11/17/2017   Rheumatoid arthritis involving multiple sites with positive rheumatoid factor ([place]) 08/18/2016   OP (osteoporosis) 08/18/2016    Social Hx   Social History   Socioeconomic History   Marital status: Married    Spouse name: Not on file   Number of children: Not on file   Years of education: Not on file   Highest education level: Not on file  Occupational History   Not on file  Tobacco Use   Smoking status: Every Day    Packs/day: 0.25    Years: 40.00    Pack years: 10.00    Types: Cigarettes    Passive exposure: Never   Smokeless tobacco: Never  Vaping Use   Vaping Use: Never used  Substance and Sexual Activity   Alcohol use: No   Drug use: No   Sexual activity: Not on file  Other Topics Concern   Not on file  Social History Narrative   Not on file   Social Determinants of Health   Financial Resource Strain: Not on file  Food Insecurity: Not on file  Transportation Needs:  Not on file  Physical Activity: Not on file  Stress: Not on file  Social Connections: Not on file    Review of Systems  Constitutional: Negative.   Musculoskeletal:  Positive for arthralgias.   Objective:  BP 136/78   Pulse 69   Temp 97.8 F (36.6 C)   Wt 151 lb 9.6 oz (68.8 kg)   SpO2 100%   BMI 23.74 kg/m      10/10/2021   10:40 AM 08/09/2021    8:04 AM 08/09/2021    7:59 AM  BP/Weight  Systolic BP 301 601 97  Diastolic BP 78 56 46  Wt. (Lbs) 151.6    BMI 23.74 kg/m2      Physical Exam Vitals and nursing note reviewed.  Constitutional:      General: She is not in acute distress.    Appearance: Normal appearance.  HENT:     Head: Normocephalic and atraumatic.  Cardiovascular:     Rate and Rhythm: Normal rate and regular rhythm.  Pulmonary:     Effort: Pulmonary effort is normal.     Breath sounds: Normal breath sounds. No wheezing, rhonchi or rales.  Neurological:     Mental Status: She is alert.  Psychiatric:        Mood and Affect: Mood normal.        Behavior: Behavior normal.  Lab Results  Component Value Date   WBC 8.6 07/25/2021   HGB 13.2 07/25/2021   HCT 39.9 07/25/2021   PLT 186 07/25/2021   GLUCOSE 81 07/25/2021   CHOL 172 01/08/2021   TRIG 122 01/08/2021   HDL 47 01/08/2021   LDLCALC 103 (H) 01/08/2021   ALT 11 07/25/2021   AST 14 07/25/2021   NA 140 07/25/2021   K 3.9 07/25/2021   CL 107 07/25/2021   CREATININE 0.90 07/25/2021   BUN 18 07/25/2021   CO2 26 07/25/2021   TSH 0.702 09/12/2017     Assessment & Plan:   Problem List Items Addressed This Visit       Cardiovascular and Mediastinum   Essential hypertension    Stable.  Continue amlodipine.       Relevant Medications   amLODipine (NORVASC) 5 MG tablet     Musculoskeletal and Integument   OP (osteoporosis)    Patient continues to decline treatment.       Rheumatoid arthritis involving multiple sites with positive rheumatoid factor ([facility])    Stable at this  time on Humira.  Continue close follow-up with rheumatology.         Other   Tobacco abuse    Advised patient that she needs to quit.       Relevant Medications   albuterol (VENTOLIN HFA) 108 (90 Base) MCG/ACT inhaler    Meds ordered this encounter  Medications   albuterol (VENTOLIN HFA) 108 (90 Base) MCG/ACT inhaler    Sig: INHALE TWO PUFFS INTO THE LUNGS EVERY SIX HOURS AS NEEDED FOR WHEEZING OR SHORTNESS OF BREATH    Dispense:  18 g    Refill:  3   amLODipine (NORVASC) 5 MG tablet    Sig: Take 1 tablet (5 mg total) by mouth daily.    Dispense:  90 tablet    Refill:  3    Follow-up:  Return in about 6 months (around 04/12/2022).  [place]

## 2021-11-01 ENCOUNTER — Telehealth: Payer: Self-pay

## 2021-11-01 NOTE — Telephone Encounter (Signed)
Pt contacted and appt for Monday scheduled with Barbee Shropshire

## 2021-11-01 NOTE — Telephone Encounter (Signed)
Caller name:Durga Nobis   On DPR? :Yes  Call back [phone]  Provider they see: Lacinda Axon   Reason for call:Pt has some protruding in her vagina  she don't know if its her bladder or what and needs direction on who to see

## 2021-11-04 ENCOUNTER — Encounter: Payer: Self-pay | Admitting: Nurse Practitioner

## 2021-11-04 ENCOUNTER — Ambulatory Visit: Payer: BC Managed Care – PPO | Admitting: Nurse Practitioner

## 2021-11-04 VITALS — BP 147/65 | HR 71 | Temp 98.4°F | Wt 150.0 lb

## 2021-11-04 DIAGNOSIS — N814 Uterovaginal prolapse, unspecified: Secondary | ICD-10-CM

## 2021-11-04 LAB — POCT URINALYSIS DIP (MANUAL ENTRY)
Bilirubin, UA: NEGATIVE
Glucose, UA: NEGATIVE mg/dL
Ketones, POC UA: NEGATIVE mg/dL
Leukocytes, UA: NEGATIVE
Nitrite, UA: NEGATIVE
Protein Ur, POC: NEGATIVE mg/dL
Spec Grav, UA: 1.01 (ref 1.010–1.025)
Urobilinogen, UA: 0.2 E.U./dL
pH, UA: 5 (ref 5.0–8.0)

## 2021-11-04 NOTE — Progress Notes (Signed)
   Subjective:    Patient ID: Dawn Meyer, female    DOB: 1955-07-27, 66 y.o.   MRN: [id number]  HPI 66 year old female patient presents to clinic with daughter for complaints of something bulging in vagina 3 days ago.  Patient states that she noticed a bulging while taking a shower approximately 3 days ago.  Patient denies any pain, difficulty urinating, blood.  However patient does admit that she feels a pelvic fullness towards the end of the day.  Patient states that she has a very active lifestyle.  Patient has no other concerns today.   Review of Systems  Genitourinary:        Vaginal fullness  All other systems reviewed and are negative.      Objective:   Physical Exam Exam conducted with a chaperone present.  Constitutional:      General: She is not in acute distress.    Appearance: Normal appearance. She is normal weight. She is not ill-appearing, toxic-appearing or diaphoretic.  Abdominal:     Hernia: There is no hernia in the left inguinal area or right inguinal area.  Genitourinary:    Exam position: Lithotomy position.     Pubic Area: No rash or pubic lice.      Labia:        Right: No rash, tenderness, lesion or injury.        Left: No rash, tenderness, lesion or injury.      Urethra: No prolapse, urethral pain, urethral swelling or urethral lesion.     Vagina: Prolapsed vaginal walls present.     Cervix: Normal.     Uterus: With uterine prolapse.      Comments: Uterine prolapse noted on cervical exam.  Uterine prolapse approximately a stage I to II.   Musculoskeletal:     Comments: Grossly intact  Lymphadenopathy:     Lower Body: No right inguinal adenopathy. No left inguinal adenopathy.  Skin:    General: Skin is warm.     Capillary Refill: Capillary refill takes less than 2 seconds.  Neurological:     Mental Status: She is alert.     Comments: Grossly intact  Psychiatric:        Mood and Affect: Mood normal.        Behavior: Behavior normal.            Assessment & Plan:   1.  Uterine prolapse -Uterine prolapse seen on exam. -We will refer patient to gynecology [facility]). -Discussed need to assess for urinary retention and to report any changes to urinary habits to OB/GYN or PCP.  Patient stated understanding - Ambulatory referral to Gynecology -Return to clinic if you have not heard from GYN office in 7 to 10 days or if symptoms worsen or do not improve.    Note:  This document was prepared using Dragon voice recognition software and may include unintentional dictation errors. Note - This record has been created using [employer].  Chart creation errors have been sought, but may not always  have been located. Such creation errors do not reflect on  the standard of medical care.

## 2021-11-12 ENCOUNTER — Other Ambulatory Visit: Payer: Self-pay | Admitting: Physician Assistant

## 2021-11-12 DIAGNOSIS — M0579 Rheumatoid arthritis with rheumatoid factor of multiple sites without organ or systems involvement: Secondary | ICD-10-CM

## 2021-11-12 DIAGNOSIS — Z79899 Other long term (current) drug therapy: Secondary | ICD-10-CM

## 2021-11-12 NOTE — Telephone Encounter (Signed)
Next Visit: 12/25/2021  Last Visit: 07/25/2021  Last Fill: 03/20/2021  [id number] arthritis involving multiple sites with positive rheumatoid factor   Current Dose per office note 07/25/2021: Humira 40 mg sq injections every 14 days  Labs: 07/25/2021 CBC and CMP WNL  TB Gold: 07/25/2021 Neg    Attempted to contact the patient and unable to leave message, recording states please enter remote access code.   Okay to refill Humira?

## 2021-12-11 NOTE — Progress Notes (Signed)
Office Visit Note  Patient: Dawn Meyer             Date of Birth: 07/07/1955           MRN: [id number]             PCP: Coral Spikes, DO Referring: Coral Spikes, DO Visit Date: 12/25/2021 Occupation: '@GUAROCC'$ @  Subjective:  Medication Management   History of Present Illness: Dawn Meyer is a 66 y.o. female with history of rheumatoid arthritis, osteoarthritis and osteoporosis.  She states she has been doing well on Humira 40 mg subcu every 2 to 3 weeks.  She states during the summer months she takes Humira every 3 weeks and during the winter months every 2 weeks.  She denies any side effects from Humira.  She states she goes twice a year to the dermatologist to check for skin cancer.  Activities of Daily Living:  Patient reports morning stiffness for 0 minutes.   Patient Denies nocturnal pain.  Difficulty dressing/grooming: Denies Difficulty climbing stairs: Denies Difficulty getting out of chair: Denies Difficulty using hands for taps, buttons, cutlery, and/or writing: Denies  Review of Systems  Constitutional:  Negative for fatigue.  HENT:  Negative for mouth sores and mouth dryness.   Eyes:  Negative for dryness.  Respiratory:  Negative for shortness of breath.   Cardiovascular:  Negative for chest pain and palpitations.  Gastrointestinal:  Negative for blood in stool, constipation and diarrhea.  Endocrine: Negative for increased urination.  Genitourinary:  Negative for involuntary urination.  Musculoskeletal:  Negative for joint pain, joint pain, joint swelling, myalgias, muscle weakness, morning stiffness, muscle tenderness and myalgias.  Skin:  Negative for color change, rash, hair loss and sensitivity to sunlight.  Allergic/Immunologic: Negative for susceptible to infections.  Neurological:  Negative for dizziness and headaches.  Hematological:  Negative for swollen glands.  Psychiatric/Behavioral:  Negative for depressed mood and sleep disturbance. The  patient is not nervous/anxious.     PMFS History:  Patient Active Problem List   Diagnosis Date Noted   Essential hypertension 10/10/2021   Asthma    Allergic rhinitis 11/17/2017   Tobacco abuse 11/17/2017   Rheumatoid arthritis involving multiple sites with positive rheumatoid factor ([place]) 08/18/2016   OP (osteoporosis) 08/18/2016    Past Medical History:  Diagnosis Date   Asthma    Prolapsed uterus    per patient, dx by OB-GYN   RA (rheumatoid arthritis) ([place])     Family History  Problem Relation Age of Onset   Congestive Heart Failure Mother    Heart attack Mother    Diabetes Father    Heart Problems Father        pacemaker   Diverticulitis Sister    Hypertension Sister    Heart attack Brother    Healthy Daughter    Colon cancer Neg Hx    Past Surgical History:  Procedure Laterality Date   COLONOSCOPY WITH PROPOFOL N/A 08/09/2021   Procedure: COLONOSCOPY WITH PROPOFOL;  Surgeon: Daneil Dolin, MD;  Location: [facility];  Service: Endoscopy;  Laterality: N/A;  7:30am   MOHS SURGERY     SQUAMOUS CELL CARCINOMA EXCISION     VAGINAL DELIVERY     Social History   Social History Narrative   Not on file   Immunization History  Administered Date(s) Administered   Influenza Inj Mdck Quad Pf 03/12/2017   Influenza-Unspecified 03/20/2016, 03/11/2018, 03/13/2020, 03/16/2021   Janssen (J&J) SARS-COV-2 Vaccination 08/31/2019  Td 08/28/2009     Objective: Vital Signs: BP (!) 150/80 (BP Location: Left Arm, Patient Position: Sitting, Cuff Size: Normal)   Pulse 64   Resp 17   Ht 5' 6.5" (1.689 m)   Wt 153 lb 3.2 oz (69.5 kg)   BMI 24.36 kg/m    Physical Exam Vitals and nursing note reviewed.  Constitutional:      Appearance: She is well-developed.  HENT:     Head: Normocephalic and atraumatic.  Eyes:     Conjunctiva/sclera: Conjunctivae normal.  Cardiovascular:     Rate and Rhythm: Normal rate and regular rhythm.     Heart sounds: Normal heart sounds.   Pulmonary:     Effort: Pulmonary effort is normal.     Breath sounds: Normal breath sounds.  Abdominal:     General: Bowel sounds are normal.     Palpations: Abdomen is soft.  Musculoskeletal:     Cervical back: Normal range of motion.  Lymphadenopathy:     Cervical: No cervical adenopathy.  Skin:    General: Skin is warm and dry.     Capillary Refill: Capillary refill takes less than 2 seconds.  Neurological:     Mental Status: She is alert and oriented to person, place, and time.  Psychiatric:        Behavior: Behavior normal.      Musculoskeletal Exam: C-spine was in good range of motion.  She had thoracic kyphosis.  There was no point tenderness over thoracic or lumbar spine.  Shoulder joints, elbow joints, wrist joints with good range of motion.  She had thickening of the right second and third MCP joints but no synovitis was noted.  PIP and DIP thickening was noted.  Hip joints and knee joints with good range of motion.  She had no tenderness over ankles or MTPs.  CDAI Exam: CDAI Score: 0.4  Patient Global: 2 mm; Provider Global: 2 mm Swollen: 0 ; Tender: 0  Joint Exam 12/25/2021   No joint exam has been documented for this visit   There is currently no information documented on the homunculus. Go to the Rheumatology activity and complete the homunculus joint exam.  Investigation: No additional findings.  Imaging: No results found.  Recent Labs: Lab Results  Component Value Date   WBC 8.6 07/25/2021   HGB 13.2 07/25/2021   PLT 186 07/25/2021   NA 140 07/25/2021   K 3.9 07/25/2021   CL 107 07/25/2021   CO2 26 07/25/2021   GLUCOSE 81 07/25/2021   BUN 18 07/25/2021   CREATININE 0.90 07/25/2021   BILITOT 0.5 07/25/2021   ALKPHOS 104 01/08/2021   AST 14 07/25/2021   ALT 11 07/25/2021   PROT 6.6 07/25/2021   ALBUMIN 4.1 01/08/2021   CALCIUM 9.3 07/25/2021   GFRAA 78 03/13/2020   QFTBGOLD Negative 07/29/2016   QFTBGOLDPLUS NEGATIVE 07/25/2021     Speciality Comments: Osteoporosis manged by Dr. Estanislado Pandy.  She declines treatment-ACY 01/11/2019  Procedures:  No procedures performed Allergies: Doxycycline   Assessment / Plan:     Visit Diagnoses: Rheumatoid arthritis involving multiple sites with positive rheumatoid factor (HCC)-she had been doing well on Humira every 3 weeks during the summertime.  She states she is during the wintertime she changes the frequency of Humira injections to every 2 weeks.  She denies any history of synovitis.  She denies any joint pain.  She has been active and doing all routine activities.  I plan to repeat x-rays of her bilateral  hands and her bilateral feet at the next visit.  High risk medication use - Humira 40 mg sq injections every 14 days (taking every 3 weeks for the summer).  Last labs were in March 2023.  CBC with differential and CMP with GFR 4 were normal.  TB gold was negative.  She was advised to get labs every 3 months to monitor for drug toxicity.- Plan: CBC with Differential/Platelet, COMPLETE METABOLIC PANEL WITH GFR.  Information regarding immunization was placed in the AVS.  She was also advised to hold Humira if she develops an infection and resume after the infection resolves.  Primary osteoarthritis of both hands-joint protection muscle strengthening was discussed.  Primary osteoarthritis of both feet-she has been having some stiffness in her feet.  No synovitis was noted.  Age-related osteoporosis without current pathological fracture - DEXA 01/19/14 left forearm at the 33% radius site, BMD 0.588 T-score -3.3. patient declined monitoring DEXA at the last visit.  -I again discussed treatment for osteoporosis which she declined.  Plan: VITAMIN D 25 Hydroxy (Vit-D Deficiency, Fractures)  Vitamin D deficiency -she takes vitamin D supplement.  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   She sees dermatologist twice a year.  Essential hypertension-she is on amlodipine.  Her blood pressure was elevated today.  She was advised to monitor blood pressure closely and follow-up with her PCP.  History of asthma-she uses albuterol inhaler on as needed basis.  Uterine prolapse-she was recently diagnosed with uterine prolapse.  She will undergo surgery for uterine prolapse.  She was advised to stop Humira at least 2 weeks prior to the surgery and resume 2 weeks after the surgery if there is no infection.  Orders: Orders Placed This Encounter  Procedures   CBC with Differential/Platelet   COMPLETE METABOLIC PANEL WITH GFR   VITAMIN D 25 Hydroxy (Vit-D Deficiency, Fractures)   No orders of the defined types were placed in this encounter.    Follow-Up Instructions: Return in about 5 months (around 05/27/2022) for Rheumatoid arthritis.   Bo Merino, MD  Note - This record has been created using Editor, commissioning.  Chart creation errors have been sought, but may not always  have been located. Such creation errors do not reflect on  the standard of medical care.

## 2021-12-25 ENCOUNTER — Encounter: Payer: Self-pay | Admitting: Rheumatology

## 2021-12-25 ENCOUNTER — Ambulatory Visit: Payer: BC Managed Care – PPO | Attending: Rheumatology | Admitting: Rheumatology

## 2021-12-25 ENCOUNTER — Telehealth: Payer: Self-pay | Admitting: *Deleted

## 2021-12-25 VITALS — BP 150/80 | HR 64 | Resp 17 | Ht 66.5 in | Wt 153.2 lb

## 2021-12-25 DIAGNOSIS — M19042 Primary osteoarthritis, left hand: Secondary | ICD-10-CM

## 2021-12-25 DIAGNOSIS — M810 Age-related osteoporosis without current pathological fracture: Secondary | ICD-10-CM

## 2021-12-25 DIAGNOSIS — M19071 Primary osteoarthritis, right ankle and foot: Secondary | ICD-10-CM

## 2021-12-25 DIAGNOSIS — E559 Vitamin D deficiency, unspecified: Secondary | ICD-10-CM

## 2021-12-25 DIAGNOSIS — Z79899 Other long term (current) drug therapy: Secondary | ICD-10-CM

## 2021-12-25 DIAGNOSIS — M0579 Rheumatoid arthritis with rheumatoid factor of multiple sites without organ or systems involvement: Secondary | ICD-10-CM | POA: Diagnosis not present

## 2021-12-25 DIAGNOSIS — M19041 Primary osteoarthritis, right hand: Secondary | ICD-10-CM

## 2021-12-25 DIAGNOSIS — I10 Essential (primary) hypertension: Secondary | ICD-10-CM

## 2021-12-25 DIAGNOSIS — Z85828 Personal history of other malignant neoplasm of skin: Secondary | ICD-10-CM

## 2021-12-25 DIAGNOSIS — N814 Uterovaginal prolapse, unspecified: Secondary | ICD-10-CM

## 2021-12-25 DIAGNOSIS — M19072 Primary osteoarthritis, left ankle and foot: Secondary | ICD-10-CM

## 2021-12-25 DIAGNOSIS — Z8709 Personal history of other diseases of the respiratory system: Secondary | ICD-10-CM

## 2021-12-25 NOTE — Telephone Encounter (Signed)
Patient was in office today for an appointment. She advised Dr. Estanislado Pandy she has an upcoming surgery. Patient advised she will need to hold her Humira for 2 weeks prior to surgery and may resume 2 weeks after surgery if cleared by surgeon. Patient expressed understanding.

## 2021-12-25 NOTE — Patient Instructions (Signed)
Standing Labs We placed an order today for your standing lab work.   Please have your standing labs drawn in November and every 3 months and your  If possible, please have your labs drawn 2 weeks prior to your appointment so that the provider can discuss your results at your appointment.  Please note that you may see your imaging and lab results in [facility] before we have reviewed them. We may be awaiting multiple results to interpret others before contacting you. Please allow our office up to 72 hours to thoroughly review all of the results before contacting the office for clarification of your results.  We have open lab daily: Monday through Thursday from 1:30-4:30 PM and Friday from 1:30-4:00 PM at the office of Dr. Bo Merino, [facility].   Please be advised, all patients with office appointments requiring lab work will take precedent over walk-in lab work.  If possible, please come for your lab work on Monday and Friday afternoons, as you may experience shorter wait times. The office is located at [address], [place], [place], [place] [address] No appointment is necessary.   Labs are drawn by [facility]. Please bring your co-pay at the time of your lab draw.  You may receive a bill from [facility] for your lab work.  Please note if you are on Hydroxychloroquine and and an order has been placed for a Hydroxychloroquine level, you will need to have it drawn 4 hours or more after your last dose.  If you wish to have your labs drawn at another location, please call the office 24 hours in advance to send orders.  If you have any questions regarding directions or hours of operation,  please call [phone].   As a reminder, please drink plenty of water prior to coming for your lab work. Thanks!   Vaccines You are taking a medication(s) that can suppress your immune system.  The following immunizations are recommended: Flu annually Covid-19  Td/Tdap (tetanus,  diphtheria, pertussis) every 10 years Pneumonia (Prevnar 15 then Pneumovax 23 at least 1 year apart.  Alternatively, can take Prevnar 20 without needing additional dose) Shingrix: 2 doses from 4 weeks to 6 months apart  Please check with your PCP to make sure you are up to date.   If you have signs or symptoms of an infection or start antibiotics: First, call your PCP for workup of your infection. Hold your medication through the infection, until you complete your antibiotics, and until symptoms resolve if you take the following: Injectable medication (Actemra, Benlysta, Cimzia, Cosentyx, Enbrel, Humira, Kevzara, Orencia, Remicade, Simponi, Stelara, Taltz, Tremfya) Methotrexate Leflunomide (Arava) Mycophenolate (Cellcept) Morrie Sheldon, Olumiant, or Rinvoq

## 2021-12-26 LAB — CBC WITH DIFFERENTIAL/PLATELET
Absolute Monocytes: 564 cells/uL (ref 200–950)
Basophils Absolute: 42 cells/uL (ref 0–200)
Basophils Relative: 0.5 %
Eosinophils Absolute: 531 cells/uL — ABNORMAL HIGH (ref 15–500)
Eosinophils Relative: 6.4 %
HCT: 38.3 % (ref 35.0–45.0)
Hemoglobin: 13 g/dL (ref 11.7–15.5)
Lymphs Abs: 3229 cells/uL (ref 850–3900)
MCH: 31.8 pg (ref 27.0–33.0)
MCHC: 33.9 g/dL (ref 32.0–36.0)
MCV: 93.6 fL (ref 80.0–100.0)
MPV: 10.2 fL (ref 7.5–12.5)
Monocytes Relative: 6.8 %
Neutro Abs: 3934 cells/uL (ref 1500–7800)
Neutrophils Relative %: 47.4 %
Platelets: 164 10*3/uL (ref 140–400)
RBC: 4.09 10*6/uL (ref 3.80–5.10)
RDW: 12.5 % (ref 11.0–15.0)
Total Lymphocyte: 38.9 %
WBC: 8.3 10*3/uL (ref 3.8–10.8)

## 2021-12-26 LAB — COMPLETE METABOLIC PANEL WITH GFR
AG Ratio: 1.4 (calc) (ref 1.0–2.5)
ALT: 10 U/L (ref 6–29)
AST: 13 U/L (ref 10–35)
Albumin: 3.9 g/dL (ref 3.6–5.1)
Alkaline phosphatase (APISO): 98 U/L (ref 37–153)
BUN: 14 mg/dL (ref 7–25)
CO2: 26 mmol/L (ref 20–32)
Calcium: 9.2 mg/dL (ref 8.6–10.4)
Chloride: 109 mmol/L (ref 98–110)
Creat: 0.96 mg/dL (ref 0.50–1.05)
Globulin: 2.7 g/dL (calc) (ref 1.9–3.7)
Glucose, Bld: 81 mg/dL (ref 65–99)
Potassium: 4.1 mmol/L (ref 3.5–5.3)
Sodium: 142 mmol/L (ref 135–146)
Total Bilirubin: 0.5 mg/dL (ref 0.2–1.2)
Total Protein: 6.6 g/dL (ref 6.1–8.1)
eGFR: 66 mL/min/{1.73_m2} (ref >=60)

## 2021-12-26 LAB — VITAMIN D 25 HYDROXY (VIT D DEFICIENCY, FRACTURES): Vit D, 25-Hydroxy: 52 ng/mL (ref 30–100)

## 2021-12-26 NOTE — Progress Notes (Signed)
CBC, CMP and vitamin D are within normal limits.

## 2022-01-13 ENCOUNTER — Ambulatory Visit: Payer: BC Managed Care – PPO | Admitting: Podiatry

## 2022-01-13 DIAGNOSIS — Q828 Other specified congenital malformations of skin: Secondary | ICD-10-CM | POA: Diagnosis not present

## 2022-01-16 NOTE — Progress Notes (Signed)
Subjective: 66 year old female presents the office with concern of bilateral foot pain, callus formation.  She states the calluses are starting to hurt again.  No swelling or redness or any drainage or any open lesions.  She does keep a pad around them which helps some.  No other concerns.  Objective: AAO x3, NAD DP/PT pulses palpable bilaterally, CRT less than 3 seconds Hyperkeratotic lesions noted submetatarsal 3 on the right foot as well as left plantar hallux.  There is callus formation present submetatarsal 5 on the left foot.  There is no underlying ulceration drainage or any signs of infection noted today.  Prominent metatarsal head.  Bunion present.  Hallux extensors left side.  No pain with calf compression, swelling, warmth, erythema  Assessment: Hyperkeratotic lesions to digital deformity, prominent metatarsal heads  Plan: -All treatment options discussed with the patient including all alternatives, risks, complications.  -Sharply debrided the hyperkeratotic lesions with any complications x 3. Continue moisturizer and offloading daily.  Discussed offloading pads and shoe modifications.  Monitoring skin breakdown or signs of infection. -Patient encouraged to call the office with any questions, concerns, change in symptoms.   Trula Slade DPM

## 2022-02-05 ENCOUNTER — Other Ambulatory Visit: Payer: Self-pay | Admitting: Physician Assistant

## 2022-02-05 DIAGNOSIS — M0579 Rheumatoid arthritis with rheumatoid factor of multiple sites without organ or systems involvement: Secondary | ICD-10-CM

## 2022-02-05 NOTE — Telephone Encounter (Signed)
Next Visit: 05/29/2022  Last Visit: 12/25/2021  Last Fill: 11/12/2021  DX: Rheumatoid arthritis involving multiple sites with positive rheumatoid factor   Current Dose per office note 12/25/2021: Humira 40 mg sq injections every 14 days   Labs: 12/25/2021 CBC, CMP and vitamin D are within normal limits.  TB Gold: 07/25/2021 Neg    Okay to refill Humira?

## 2022-02-06 ENCOUNTER — Telehealth: Payer: Self-pay | Admitting: Rheumatology

## 2022-02-06 NOTE — Telephone Encounter (Signed)
Prescription sent to the pharmacy on 02/05/2022. 30 day with 2 additional refills.   Attempted to contact the patient x 2. Recording comes on stating "I'm sorry your call can not be completed at this time. Please hang up and try your call again later."

## 2022-02-06 NOTE — Telephone Encounter (Unsigned)
Patient called stating she only received 1 box with 2 injections for her Humira.  Patient states she called the specialty pharmacy and was told they need an updated prescription.

## 2022-02-07 NOTE — Telephone Encounter (Signed)
Spoke with patient and advised we sent the prescription to the pharmacy on 02/05/2022 with 2 additional refill. Patient expressed understanding.

## 2022-03-17 ENCOUNTER — Telehealth: Payer: Self-pay | Admitting: Pharmacist

## 2022-03-17 NOTE — Telephone Encounter (Signed)
Submitted a Prior Authorization request to [facility] for [place] via fax. Will update once we receive a response.  Case ID # [id number] Phone: [phone] Fax: [phone]  Knox Saliva, PharmD, MPH, BCPS, CPP Clinical Pharmacist (Rheumatology and Pulmonology)

## 2022-03-18 NOTE — Telephone Encounter (Signed)
Received notification from [facility] regarding a prior authorization for [place]. Authorization has been APPROVED from 03/17/22 to 03/18/23.   Patient must continue to fill through [facility]: [phone]  Authorization # [id number]  Knox Saliva, PharmD, MPH, BCPS, CPP Clinical Pharmacist (Rheumatology and Pulmonology)

## 2022-04-14 ENCOUNTER — Ambulatory Visit: Payer: BC Managed Care – PPO | Admitting: Podiatry

## 2022-04-14 DIAGNOSIS — Q828 Other specified congenital malformations of skin: Secondary | ICD-10-CM

## 2022-04-15 ENCOUNTER — Ambulatory Visit (INDEPENDENT_AMBULATORY_CARE_PROVIDER_SITE_OTHER): Payer: BC Managed Care – PPO | Admitting: Family Medicine

## 2022-04-15 VITALS — BP 132/72 | HR 70 | Temp 98.0°F | Ht 66.5 in | Wt 153.8 lb

## 2022-04-15 DIAGNOSIS — J45909 Unspecified asthma, uncomplicated: Secondary | ICD-10-CM

## 2022-04-15 DIAGNOSIS — I10 Essential (primary) hypertension: Secondary | ICD-10-CM

## 2022-04-15 DIAGNOSIS — Z720 Tobacco use: Secondary | ICD-10-CM | POA: Diagnosis not present

## 2022-04-15 MED ORDER — FLUTICASONE PROPIONATE HFA 110 MCG/ACT IN AERO: 1.0000 | INHALATION_SPRAY | Freq: Two times a day (BID) | RESPIRATORY_TRACT | 12 refills | Status: DC

## 2022-04-15 MED ORDER — ALBUTEROL SULFATE HFA 108 (90 BASE) MCG/ACT IN AERS: INHALATION_SPRAY | RESPIRATORY_TRACT | 3 refills | Status: DC

## 2022-04-15 NOTE — Patient Instructions (Addendum)
Consider CT lung cancer screening.  Think about Chantix or wellbutrin.   Follow up in 6 months.  Take care  Dr. Lacinda Axon

## 2022-04-20 NOTE — Assessment & Plan Note (Signed)
Well controlled. Continue amlodipine 

## 2022-04-20 NOTE — Progress Notes (Signed)
Subjective: No chief complaint on file.  66 year old female presents the office with concern of bilateral foot pain, callus formation.  She states the calluses are starting to hurt again particular with pressure and wear shoes.  No swelling or redness or any drainage or any open lesions.  She does continue to keep a pad around them which helps some.  No other concerns.  Objective: AAO x3, NAD DP/PT pulses palpable bilaterally, CRT less than 3 seconds Hyperkeratotic lesions noted submetatarsal 3 on the right foot as well as left plantar hallux.  There is callus formation present submetatarsal 5 on the left foot.  There is no underlying ulceration drainage or any signs of infection noted today.  Prominent metatarsal head.  Bunion present.  Hallux extensors left side.  No pain with calf compression, swelling, warmth, erythema  Assessment: Hyperkeratotic lesions to digital deformity, prominent metatarsal heads  Plan: -All treatment options discussed with the patient including all alternatives, risks, complications.  -Sharply debrided the hyperkeratotic lesions with any complications x 3. Continue moisturizer and offloading daily.  Discussed offloading pads and shoe modifications.  Monitoring skin breakdown or signs of infection. -Patient encouraged to call the office with any questions, concerns, change in symptoms.   Trula Slade DPM

## 2022-04-20 NOTE — Progress Notes (Signed)
Subjective:  Patient ID: Dawn Meyer, female    DOB: June 18, 1955  Age: 66 y.o. MRN: [id number]  CC: Chief Complaint  Patient presents with   Follow-up    Follow up on medications. Patient states she is doing well with her medications.    HPI:  66 year old female with HTN, Asthma (? COPD given smoking hx), Osteoporosis, rheumatoid arthritis, tobacco abuse presents for follow-up.  Hypertension is well-controlled on amlodipine.  Patient is having upcoming surgery for cervical hypertrophic elongation.  Patient continues to smoke.  Will discuss CT lung cancer screening and tools for smoking cessation today.  Patient needs refills on her inhalers.   Patient Active Problem List   Diagnosis Date Noted   Essential hypertension 10/10/2021   Asthma    Allergic rhinitis 11/17/2017   Tobacco abuse 11/17/2017   Rheumatoid arthritis involving multiple sites with positive rheumatoid factor ([place]) 08/18/2016   OP (osteoporosis) 08/18/2016    Social Hx   Social History   Socioeconomic History   Marital status: Married    Spouse name: Not on file   Number of children: Not on file   Years of education: Not on file   Highest education level: Not on file  Occupational History   Not on file  Tobacco Use   Smoking status: Every Day    Packs/day: 0.25    Years: 40.00    Total pack years: 10.00    Types: Cigarettes    Passive exposure: Never   Smokeless tobacco: Never  Vaping Use   Vaping Use: Never used  Substance and Sexual Activity   Alcohol use: No   Drug use: No   Sexual activity: Not on file  Other Topics Concern   Not on file  Social History Narrative   Not on file   Social Determinants of Health   Financial Resource Strain: Not on file  Food Insecurity: Not on file  Transportation Needs: Not on file  Physical Activity: Not on file  Stress: Not on file  Social Connections: Not on file    Review of Systems  Constitutional: Negative.   Respiratory:  Negative.    Cardiovascular: Negative.     Objective:  BP 132/72   Pulse 70   Temp 98 F (36.7 C) (Oral)   Ht 5' 6.5" (1.689 m)   Wt 153 lb 12.8 oz (69.8 kg)   SpO2 97%   BMI 24.45 kg/m      04/15/2022   10:12 AM 12/25/2021    9:21 AM 11/04/2021   11:07 AM  BP/Weight  Systolic BP 664 403 474  Diastolic BP 72 80 65  Wt. (Lbs) 153.8 153.2 150  BMI 24.45 kg/m2 24.36 kg/m2 23.49 kg/m2    Physical Exam Vitals and nursing note reviewed.  Constitutional:      General: She is not in acute distress.    Appearance: Normal appearance.  HENT:     Head: Normocephalic and atraumatic.  Cardiovascular:     Rate and Rhythm: Normal rate and regular rhythm.  Pulmonary:     Effort: Pulmonary effort is normal.     Breath sounds: Normal breath sounds. No wheezing, rhonchi or rales.  Neurological:     Mental Status: She is alert.  Psychiatric:        Mood and Affect: Mood normal.        Behavior: Behavior normal.     Lab Results  Component Value Date   WBC 8.3 12/25/2021   HGB 13.0 12/25/2021  HCT 38.3 12/25/2021   PLT 164 12/25/2021   GLUCOSE 81 12/25/2021   CHOL 172 01/08/2021   TRIG 122 01/08/2021   HDL 47 01/08/2021   LDLCALC 103 (H) 01/08/2021   ALT 10 12/25/2021   AST 13 12/25/2021   NA 142 12/25/2021   K 4.1 12/25/2021   CL 109 12/25/2021   CREATININE 0.96 12/25/2021   BUN 14 12/25/2021   CO2 26 12/25/2021   TSH 0.702 09/12/2017     Assessment & Plan:   Problem List Items Addressed This Visit       Cardiovascular and Mediastinum   Essential hypertension - Primary    Well-controlled.  Continue amlodipine.        Respiratory   Asthma    Stable.  Advised smoking cessation.  Inhalers refilled.      Relevant Medications   albuterol (VENTOLIN HFA) 108 (90 Base) MCG/ACT inhaler   fluticasone (FLOVENT HFA) 110 MCG/ACT inhaler     Other   Tobacco abuse    Discussed Chantix and Wellbutrin.  Also discussed CT lung cancer screening.  Patient will  consider.  She wants to hold off until after her surgery is done.      Relevant Medications   albuterol (VENTOLIN HFA) 108 (90 Base) MCG/ACT inhaler    Meds ordered this encounter  Medications   albuterol (VENTOLIN HFA) 108 (90 Base) MCG/ACT inhaler    Sig: INHALE TWO PUFFS INTO THE LUNGS EVERY SIX HOURS AS NEEDED FOR WHEEZING OR SHORTNESS OF BREATH    Dispense:  18 g    Refill:  3   fluticasone (FLOVENT HFA) 110 MCG/ACT inhaler    Sig: Inhale 1 puff into the lungs 2 (two) times daily.    Dispense:  1 each    Refill:  12    Follow-up:  6 months.   [place]

## 2022-04-20 NOTE — Assessment & Plan Note (Signed)
Stable.  Advised smoking cessation.  Inhalers refilled.

## 2022-04-20 NOTE — Assessment & Plan Note (Signed)
Discussed Chantix and Wellbutrin.  Also discussed CT lung cancer screening.  Patient will consider.  She wants to hold off until after her surgery is done.

## 2022-04-22 ENCOUNTER — Telehealth: Payer: Self-pay | Admitting: *Deleted

## 2022-04-22 NOTE — Telephone Encounter (Signed)
Per insurance Fluticasone Propionate HFA 162mg is non formulary- preferred formulary alteratives: Alvesco, Asmanex, Budesonide, Flovent Diskus, Flovent HFA, Pulmicort Flexhaler

## 2022-04-23 ENCOUNTER — Other Ambulatory Visit: Payer: Self-pay | Admitting: Family Medicine

## 2022-04-23 MED ORDER — BUDESONIDE 180 MCG/ACT IN AEPB: 2.0000 | INHALATION_SPRAY | Freq: Two times a day (BID) | RESPIRATORY_TRACT | 3 refills | Status: DC

## 2022-04-23 NOTE — Telephone Encounter (Signed)
Coral Spikes, DO     Sent in Alternative.

## 2022-04-23 NOTE — Telephone Encounter (Signed)
Patient notified

## 2022-04-28 HISTORY — PX: OTHER SURGICAL HISTORY: SHX169

## 2022-05-09 ENCOUNTER — Encounter: Payer: Self-pay | Admitting: Family Medicine

## 2022-05-09 ENCOUNTER — Ambulatory Visit (INDEPENDENT_AMBULATORY_CARE_PROVIDER_SITE_OTHER): Payer: BC Managed Care – PPO | Admitting: Family Medicine

## 2022-05-09 VITALS — BP 154/85 | HR 77 | Temp 98.2°F | Ht 66.5 in | Wt 151.0 lb

## 2022-05-09 DIAGNOSIS — J019 Acute sinusitis, unspecified: Secondary | ICD-10-CM | POA: Diagnosis not present

## 2022-05-09 MED ORDER — HYDROCODONE BIT-HOMATROP MBR 5-1.5 MG/5ML PO SOLN: 5.0000 mL | Freq: Three times a day (TID) | ORAL | 0 refills | Status: DC | PRN

## 2022-05-09 MED ORDER — AMOXICILLIN-POT CLAVULANATE 875-125 MG PO TABS: 1.0000 | ORAL_TABLET | Freq: Two times a day (BID) | ORAL | 0 refills | Status: DC

## 2022-05-09 MED ORDER — FLUCONAZOLE 150 MG PO TABS: 150.0000 mg | ORAL_TABLET | Freq: Once | ORAL | 0 refills | Status: AC

## 2022-05-09 NOTE — Patient Instructions (Signed)
Medications as prescribed. ° °Take care ° °Dr. Jossette Zirbel  °

## 2022-05-11 DIAGNOSIS — J019 Acute sinusitis, unspecified: Secondary | ICD-10-CM | POA: Insufficient documentation

## 2022-05-11 NOTE — Assessment & Plan Note (Signed)
Treating with Augmentin. Hycodan for cough as needed. Diflucan to be used if yeast vaginitis develops.

## 2022-05-11 NOTE — Progress Notes (Signed)
Subjective:  Patient ID: Dawn Meyer, female    DOB: 06/16/1955  Age: 66 y.o. MRN: [id number]  CC: Chief Complaint  Patient presents with   sinus congestion    Congestion and rhinitis x increasing daily     HPI:  66 year old female presents for evaluation of the above.  Symptoms for over a week. Significant sinus congestion, runny nose and cough. Worsening. No fever. Husband has been sick as well. No relieving factors. No other complaints.   Patient Active Problem List   Diagnosis Date Noted   Acute rhinosinusitis 05/11/2022   Essential hypertension 10/10/2021   Asthma    Allergic rhinitis 11/17/2017   Tobacco abuse 11/17/2017   Rheumatoid arthritis involving multiple sites with positive rheumatoid factor ([place]) 08/18/2016   OP (osteoporosis) 08/18/2016    Social Hx   Social History   Socioeconomic History   Marital status: Married    Spouse name: Not on file   Number of children: Not on file   Years of education: Not on file   Highest education level: Not on file  Occupational History   Not on file  Tobacco Use   Smoking status: Every Day    Packs/day: 0.25    Years: 40.00    Total pack years: 10.00    Types: Cigarettes    Passive exposure: Never   Smokeless tobacco: Never  Vaping Use   Vaping Use: Never used  Substance and Sexual Activity   Alcohol use: No   Drug use: No   Sexual activity: Not on file  Other Topics Concern   Not on file  Social History Narrative   Not on file   Social Determinants of Health   Financial Resource Strain: Not on file  Food Insecurity: Not on file  Transportation Needs: Not on file  Physical Activity: Not on file  Stress: Not on file  Social Connections: Not on file    Review of Systems Per HPI  Objective:  BP (!) 154/85   Pulse 77   Temp 98.2 F (36.8 C)   Ht 5' 6.5" (1.689 m)   Wt 151 lb (68.5 kg)   SpO2 99%   BMI 24.01 kg/m      05/09/2022    4:08 PM 05/09/2022    3:49 PM 04/15/2022   10:12  AM  BP/Weight  Systolic BP 315 176 160  Diastolic BP 85 87 72  Wt. (Lbs)  151 153.8  BMI  24.01 kg/m2 24.45 kg/m2    Physical Exam Constitutional:      General: She is not in acute distress.    Appearance: Normal appearance.  HENT:     Head: Normocephalic and atraumatic.     Nose: Congestion present.  Eyes:     General:        Right eye: No discharge.        Left eye: No discharge.     Conjunctiva/sclera: Conjunctivae normal.  Cardiovascular:     Rate and Rhythm: Normal rate and regular rhythm.  Pulmonary:     Effort: Pulmonary effort is normal.     Breath sounds: Normal breath sounds. No wheezing, rhonchi or rales.  Neurological:     Mental Status: She is alert.  Psychiatric:        Mood and Affect: Mood normal.        Behavior: Behavior normal.    Lab Results  Component Value Date   WBC 8.3 12/25/2021   HGB 13.0 12/25/2021  HCT 38.3 12/25/2021   PLT 164 12/25/2021   GLUCOSE 81 12/25/2021   CHOL 172 01/08/2021   TRIG 122 01/08/2021   HDL 47 01/08/2021   LDLCALC 103 (H) 01/08/2021   ALT 10 12/25/2021   AST 13 12/25/2021   NA 142 12/25/2021   K 4.1 12/25/2021   CL 109 12/25/2021   CREATININE 0.96 12/25/2021   BUN 14 12/25/2021   CO2 26 12/25/2021   TSH 0.702 09/12/2017     Assessment & Plan:   Problem List Items Addressed This Visit       Respiratory   Acute rhinosinusitis - Primary    Treating with Augmentin. Hycodan for cough as needed. Diflucan to be used if yeast vaginitis develops.       Relevant Medications   amoxicillin-clavulanate (AUGMENTIN) 875-125 MG tablet   HYDROcodone bit-homatropine (HYCODAN) 5-1.5 MG/5ML syrup    Meds ordered this encounter  Medications   amoxicillin-clavulanate (AUGMENTIN) 875-125 MG tablet    Sig: Take 1 tablet by mouth 2 (two) times daily.    Dispense:  14 tablet    Refill:  0   HYDROcodone bit-homatropine (HYCODAN) 5-1.5 MG/5ML syrup    Sig: Take 5 mLs by mouth every 8 (eight) hours as needed for cough.     Dispense:  120 mL    Refill:  0   fluconazole (DIFLUCAN) 150 MG tablet    Sig: Take 1 tablet (150 mg total) by mouth once for 1 dose. Repeat dose in 72 hours.    Dispense:  2 tablet    Refill:  0    Follow-up:  Return if symptoms worsen or fail to improve.  [place]

## 2022-05-12 ENCOUNTER — Ambulatory Visit: Payer: BC Managed Care – PPO | Admitting: Podiatry

## 2022-05-15 NOTE — Progress Notes (Unsigned)
Office Visit Note  Patient: Dawn Meyer             Date of Birth: 1955/09/12           MRN: [id number]             PCP: Coral Spikes, DO Referring: Coral Spikes, DO Visit Date: 05/29/2022 Occupation: '@GUAROCC'$ @  Subjective:  Medication monitoring  History of Present Illness: Dawn Meyer is a 66 y.o. female with history of seropositive rheumatoid arthritis and osteoarthritis.  Patient is currently prescribed Humira 40 mg subcutaneous injections every 14 days.  She continues to tolerate Humira without any side effects or injection site reactions.  Patient reports that she had a prolapsed cervix which was surgically repaired on 04/28/2022.  She states that her last dose of Humira was administered at the end of November and then she resumed therapy on 05/19/2022.  She states that a week after surgery she was diagnosed with sinusitis and had to take a course of Augmentin which was why she postponed the Humira dose until Christmas day.  She states that she did not have a flare during the gap in therapy.  She is not experiencing any increased joint pain or joint swelling.  She denies any morning stiffness or nocturnal pain.  She denies any difficulty with ADLs. She denies any other new medical conditions.  She has not had any recurrent infections.  She has an upcoming dermatology visit in January 2024 for yearly skin check.   Activities of Daily Living:  Patient reports morning stiffness for 0 minutes.   Patient Denies nocturnal pain.  Difficulty dressing/grooming: Denies Difficulty climbing stairs: Denies Difficulty getting out of chair: Denies Difficulty using hands for taps, buttons, cutlery, and/or writing: Denies  Review of Systems  Constitutional:  Negative for fatigue.  HENT:  Negative for mouth sores and mouth dryness.   Eyes:  Negative for dryness.  Respiratory:  Negative for shortness of breath.   Cardiovascular:  Negative for chest pain and palpitations.   Gastrointestinal:  Negative for blood in stool, constipation and diarrhea.  Endocrine: Negative for increased urination.  Genitourinary:  Negative for involuntary urination.  Musculoskeletal:  Negative for joint pain, gait problem, joint pain, joint swelling, myalgias, muscle weakness, morning stiffness, muscle tenderness and myalgias.  Skin:  Negative for color change, rash, hair loss and sensitivity to sunlight.  Allergic/Immunologic: Negative for susceptible to infections.  Neurological:  Negative for dizziness and headaches.  Hematological:  Negative for swollen glands.  Psychiatric/Behavioral:  Negative for depressed mood and sleep disturbance. The patient is not nervous/anxious.     PMFS History:  Patient Active Problem List   Diagnosis Date Noted   Acute rhinosinusitis 05/11/2022   Essential hypertension 10/10/2021   Asthma    Allergic rhinitis 11/17/2017   Tobacco abuse 11/17/2017   Rheumatoid arthritis involving multiple sites with positive rheumatoid factor ([place]) 08/18/2016   OP (osteoporosis) 08/18/2016    Past Medical History:  Diagnosis Date   Asthma    Prolapsed uterus    per patient, dx by OB-GYN   RA (rheumatoid arthritis) ([place])     Family History  Problem Relation Age of Onset   Congestive Heart Failure Mother    Heart attack Mother    Diabetes Father    Heart Problems Father        pacemaker   Diverticulitis Sister    Hypertension Sister    Heart attack Brother    Healthy Daughter  Colon cancer Neg Hx    Past Surgical History:  Procedure Laterality Date   COLONOSCOPY WITH PROPOFOL N/A 08/09/2021   Procedure: COLONOSCOPY WITH PROPOFOL;  Surgeon: Daneil Dolin, MD;  Location: [facility];  Service: Endoscopy;  Laterality: N/A;  7:30am   [place] procedure  04/28/2022   for prolapsed cervix   MOHS SURGERY     SQUAMOUS CELL CARCINOMA EXCISION     VAGINAL DELIVERY     Social History   Social History Narrative   Not on file   Immunization  History  Administered Date(s) Administered   Influenza Inj Mdck Quad Pf 03/12/2017   Influenza-Unspecified 03/20/2016, 03/11/2018, 03/13/2020, 03/16/2021, 03/19/2022   Janssen (J&J) SARS-COV-2 Vaccination 08/31/2019   Td 08/28/2009     Objective: Vital Signs: BP (!) 146/64 (BP Location: Left Arm, Patient Position: Sitting, Cuff Size: Normal)   Pulse 66   Resp 18   Ht 5' 6.5" (1.689 m)   Wt 152 lb 9.6 oz (69.2 kg)   BMI 24.26 kg/m    Physical Exam Vitals and nursing note reviewed.  Constitutional:      Appearance: She is well-developed.  HENT:     Head: Normocephalic and atraumatic.  Eyes:     Conjunctiva/sclera: Conjunctivae normal.  Cardiovascular:     Rate and Rhythm: Normal rate and regular rhythm.     Heart sounds: Normal heart sounds.  Pulmonary:     Effort: Pulmonary effort is normal.     Breath sounds: Normal breath sounds.  Abdominal:     General: Bowel sounds are normal.     Palpations: Abdomen is soft.  Musculoskeletal:     Cervical back: Normal range of motion.  Skin:    General: Skin is warm and dry.     Capillary Refill: Capillary refill takes less than 2 seconds.  Neurological:     Mental Status: She is alert and oriented to person, place, and time.  Psychiatric:        Behavior: Behavior normal.      Musculoskeletal Exam: C-spine, thoracic spine, and lumbar spine have good range of motion.  No midline spinal tenderness.  Shoulder joints, elbow joints, wrist joints, MCPs, PIPs, DIPs have good range of motion with no synovitis.  Thickening over the right second and third MCP joints but no synovitis noted.  Complete fist formation bilaterally.  Hip joints have good range of motion with no groin pain.  Knee joints have good range of motion with no warmth or effusion.  Ankle joints have good range of motion with no tenderness or joint swelling.  CDAI Exam: CDAI Score: -- Patient Global: 1 mm; Provider Global: 1 mm Swollen: --; Tender: -- Joint Exam  05/29/2022   No joint exam has been documented for this visit   There is currently no information documented on the homunculus. Go to the Rheumatology activity and complete the homunculus joint exam.  Investigation: No additional findings.  Imaging: No results found.  Recent Labs: Lab Results  Component Value Date   WBC 8.3 12/25/2021   HGB 13.0 12/25/2021   PLT 164 12/25/2021   NA 142 12/25/2021   K 4.1 12/25/2021   CL 109 12/25/2021   CO2 26 12/25/2021   GLUCOSE 81 12/25/2021   BUN 14 12/25/2021   CREATININE 0.96 12/25/2021   BILITOT 0.5 12/25/2021   ALKPHOS 104 01/08/2021   AST 13 12/25/2021   ALT 10 12/25/2021   PROT 6.6 12/25/2021   ALBUMIN 4.1 01/08/2021   CALCIUM  9.2 12/25/2021   GFRAA 78 03/13/2020   QFTBGOLD Negative 07/29/2016   QFTBGOLDPLUS NEGATIVE 07/25/2021    Speciality Comments: Osteoporosis manged by Dr. Estanislado Pandy.  She declines treatment-ACY 01/11/2019  Procedures:  No procedures performed Allergies: Doxycycline   Assessment / Plan:     Visit Diagnoses: Rheumatoid arthritis involving multiple sites with positive rheumatoid factor ([place]): She has no synovitis on examination today.  She has not had any signs or symptoms of a rheumatoid arthritis flare.  She underwent a uterine prolapse repair on 04/28/2022 so she had a recent gap in therapy while on Humira.  She resumed Humira on 05/19/2022.  She did not have any signs or symptoms of flare during the gap in therapy.  She is not experiencing any morning stiffness, nocturnal pain, or difficulty with ADLs.  She will remain on Humira 40 mg subcutaneous injections every 14 days.  She was advised to notify us if she develops signs or symptoms of a flare.  She will follow-up in the office in 5 months or sooner if needed.  High risk medication use - Humira 40 mg sq injections every 14 days. CBC and CMP were drawn on 12/25/2021.  Orders for CBC and CMP were released today.  Her next lab work will be due in April and  every 3 months to monitor for drug toxicity. TB Gold negative on 07/25/2021.  Future order for TB Gold placed today. Patient resumed Humira on 05/19/2022 after undergoing uterine prolapse repair on 04/28/2022.  Discussed the importance of holding Humira if she develops signs or symptoms of an infection and to resume once the infection has completely cleared. - Plan: CBC with Differential/Platelet, COMPLETE METABOLIC PANEL WITH GFR, QuantiFERON-TB Gold Plus  Screening for tuberculosis -Future order for TB Gold placed today.  Plan: QuantiFERON-TB Gold Plus  Primary osteoarthritis of both hands: She has PIP and DIP thickening consistent with osteoarthritis of both hands.  No tenderness or synovitis noted.  Complete fist formation bilaterally.  Primary osteoarthritis of both feet: Good range of motion of both ankle joints with no tenderness or synovitis.  Followed by podiatry.  Other medical conditions are listed as follows:  Age-related osteoporosis without current pathological fracture - DEXA 01/19/14 left forearm at the 33% radius site, BMD 0.588 T-score -3.3.  Patient declined monitoring DEXA at the last visit.  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  She sees dermatologist twice a year.  She has an upcoming follow-up visit in January 2024.  History of asthma  Uterine prolapse - She underwent surgical repair on 04/28/2022.  Essential hypertension: Blood pressure was elevated today in the office and was rechecked prior to the patient leaving.  She was advised to monitor her blood pressure closely and follow-up with her PCP if it remains elevated.    Orders: Orders Placed This Encounter  Procedures   CBC with Differential/Platelet   COMPLETE METABOLIC PANEL WITH GFR   QuantiFERON-TB Gold Plus   No orders of the defined types were placed in this encounter.     Follow-Up Instructions: Return in about 5 months  (around 10/28/2022) for Rheumatoid arthritis, Osteoarthritis.   Ofilia Neas, PA-C  Note - This record has been created using Dragon software.  Chart creation errors have been sought, but may not always  have been located. Such creation errors do not reflect on  the standard of medical care.

## 2022-05-29 ENCOUNTER — Ambulatory Visit: Payer: BC Managed Care – PPO | Attending: Physician Assistant | Admitting: Physician Assistant

## 2022-05-29 ENCOUNTER — Encounter: Payer: Self-pay | Admitting: Physician Assistant

## 2022-05-29 VITALS — BP 146/64 | HR 66 | Resp 18 | Ht 66.5 in | Wt 152.6 lb

## 2022-05-29 DIAGNOSIS — M0579 Rheumatoid arthritis with rheumatoid factor of multiple sites without organ or systems involvement: Secondary | ICD-10-CM | POA: Diagnosis not present

## 2022-05-29 DIAGNOSIS — Z85828 Personal history of other malignant neoplasm of skin: Secondary | ICD-10-CM

## 2022-05-29 DIAGNOSIS — M810 Age-related osteoporosis without current pathological fracture: Secondary | ICD-10-CM

## 2022-05-29 DIAGNOSIS — M19042 Primary osteoarthritis, left hand: Secondary | ICD-10-CM

## 2022-05-29 DIAGNOSIS — M19071 Primary osteoarthritis, right ankle and foot: Secondary | ICD-10-CM | POA: Diagnosis not present

## 2022-05-29 DIAGNOSIS — E559 Vitamin D deficiency, unspecified: Secondary | ICD-10-CM

## 2022-05-29 DIAGNOSIS — M19041 Primary osteoarthritis, right hand: Secondary | ICD-10-CM

## 2022-05-29 DIAGNOSIS — Z111 Encounter for screening for respiratory tuberculosis: Secondary | ICD-10-CM

## 2022-05-29 DIAGNOSIS — I10 Essential (primary) hypertension: Secondary | ICD-10-CM

## 2022-05-29 DIAGNOSIS — N814 Uterovaginal prolapse, unspecified: Secondary | ICD-10-CM

## 2022-05-29 DIAGNOSIS — Z8709 Personal history of other diseases of the respiratory system: Secondary | ICD-10-CM

## 2022-05-29 DIAGNOSIS — M19072 Primary osteoarthritis, left ankle and foot: Secondary | ICD-10-CM

## 2022-05-29 DIAGNOSIS — Z79899 Other long term (current) drug therapy: Secondary | ICD-10-CM

## 2022-05-29 NOTE — Patient Instructions (Signed)
Standing Labs We placed an order today for your standing lab work.   Please have your standing labs drawn in April and every 3 months   Please have your labs drawn 2 weeks prior to your appointment so that the provider can discuss your lab results at your appointment.  Please note that you may see your imaging and lab results in MyChart before we have reviewed them. We will contact you once all results are reviewed. Please allow our office up to 72 hours to thoroughly review all of the results before contacting the office for clarification of your results.  Lab hours are:   Monday through Thursday from 8:00 am -12:30 pm and 1:00 pm-5:00 pm and Friday from 8:00 am-12:00 pm.  Please be advised, all patients with office appointments requiring lab work will take precedent over walk-in lab work.   Labs are drawn by [facility]. Please bring your co-pay at the time of your lab draw.  You may receive a bill from [facility] for your lab work.  Please note if you are on Hydroxychloroquine and and an order has been placed for a Hydroxychloroquine level, you will need to have it drawn 4 hours or more after your last dose.  If you wish to have your labs drawn at another location, please call the office 24 hours in advance so we can fax the orders.  The office is located at [address], [address], , [place] [address] No appointment is necessary.    If you have any questions regarding directions or hours of operation,  please call [phone].   As a reminder, please drink plenty of water prior to coming for your lab work. Thanks!  If you have signs or symptoms of an infection or start antibiotics: First, call your PCP for workup of your infection. Hold your medication through the infection, until you complete your antibiotics, and until symptoms resolve if you take the following: Injectable medication (Actemra, Benlysta, Cimzia, Cosentyx, Enbrel, Humira, Kevzara, Orencia, Remicade, Simponi,  Stelara, Taltz, Tremfya) Methotrexate Leflunomide (Arava) Mycophenolate (Cellcept) Xeljanz, Olumiant, or Rinvoq  Vaccines You are taking a medication(s) that can suppress your immune system.  The following immunizations are recommended: Flu annually Covid-19  Td/Tdap (tetanus, diphtheria, pertussis) every 10 years Pneumonia (Prevnar 15 then Pneumovax 23 at least 1 year apart.  Alternatively, can take Prevnar 20 without needing additional dose) Shingrix: 2 doses from 4 weeks to 6 months apart  Please check with your PCP to make sure you are up to date.   

## 2022-05-30 LAB — CBC WITH DIFFERENTIAL/PLATELET
Absolute Monocytes: 650 cells/uL (ref 200–950)
Basophils Absolute: 29 cells/uL (ref 0–200)
Basophils Relative: 0.3 %
Eosinophils Absolute: 660 cells/uL — ABNORMAL HIGH (ref 15–500)
Eosinophils Relative: 6.8 %
HCT: 40 % (ref 35.0–45.0)
Hemoglobin: 13.7 g/dL (ref 11.7–15.5)
Lymphs Abs: 3347 cells/uL (ref 850–3900)
MCH: 32 pg (ref 27.0–33.0)
MCHC: 34.3 g/dL (ref 32.0–36.0)
MCV: 93.5 fL (ref 80.0–100.0)
MPV: 9.9 fL (ref 7.5–12.5)
Monocytes Relative: 6.7 %
Neutro Abs: 5015 cells/uL (ref 1500–7800)
Neutrophils Relative %: 51.7 %
Platelets: 194 10*3/uL (ref 140–400)
RBC: 4.28 10*6/uL (ref 3.80–5.10)
RDW: 12.6 % (ref 11.0–15.0)
Total Lymphocyte: 34.5 %
WBC: 9.7 10*3/uL (ref 3.8–10.8)

## 2022-05-30 LAB — COMPLETE METABOLIC PANEL WITH GFR
AG Ratio: 1.4 (calc) (ref 1.0–2.5)
ALT: 10 U/L (ref 6–29)
AST: 11 U/L (ref 10–35)
Albumin: 4 g/dL (ref 3.6–5.1)
Alkaline phosphatase (APISO): 102 U/L (ref 37–153)
BUN: 13 mg/dL (ref 7–25)
CO2: 26 mmol/L (ref 20–32)
Calcium: 9.4 mg/dL (ref 8.6–10.4)
Chloride: 109 mmol/L (ref 98–110)
Creat: 0.87 mg/dL (ref 0.50–1.05)
Globulin: 2.8 g/dL (calc) (ref 1.9–3.7)
Glucose, Bld: 73 mg/dL (ref 65–99)
Potassium: 4.1 mmol/L (ref 3.5–5.3)
Sodium: 143 mmol/L (ref 135–146)
Total Bilirubin: 0.5 mg/dL (ref 0.2–1.2)
Total Protein: 6.8 g/dL (ref 6.1–8.1)
eGFR: 74 mL/min/{1.73_m2} (ref >=60)

## 2022-05-30 NOTE — Progress Notes (Signed)
Absolute eosinophils are elevated. Rest of CBC WNL.  CMP WNL. We will continue to monitor.

## 2022-06-06 ENCOUNTER — Other Ambulatory Visit: Payer: Self-pay | Admitting: Physician Assistant

## 2022-06-06 DIAGNOSIS — M0579 Rheumatoid arthritis with rheumatoid factor of multiple sites without organ or systems involvement: Secondary | ICD-10-CM

## 2022-06-06 NOTE — Telephone Encounter (Signed)
Next Visit: 11/19/2022  Last Visit: 05/29/2022  Last Fill: 02/05/2022  DX: Rheumatoid arthritis involving multiple sites with positive rheumatoid factor   Current Dose per office note 05/29/2022: Humira 40 mg sq injections every 14 days.   Labs: 05/29/2022 Absolute eosinophils are elevated. Rest of CBC WNL.  CMP WNL.   TB Gold: 07/25/2021 Neg   Okay to refill Humira?

## 2022-06-26 ENCOUNTER — Ambulatory Visit: Payer: BC Managed Care – PPO | Admitting: Family Medicine

## 2022-06-26 ENCOUNTER — Emergency Department (HOSPITAL_COMMUNITY): Payer: BC Managed Care – PPO

## 2022-06-26 ENCOUNTER — Emergency Department (HOSPITAL_COMMUNITY)
Admission: EM | Admit: 2022-06-26 | Discharge: 2022-06-26 | Disposition: A | Discharge status: Home / Self Care | Payer: BC Managed Care – PPO | Attending: Emergency Medicine | Admitting: Emergency Medicine

## 2022-06-26 ENCOUNTER — Other Ambulatory Visit: Payer: Self-pay

## 2022-06-26 ENCOUNTER — Encounter (HOSPITAL_COMMUNITY): Payer: Self-pay | Admitting: Emergency Medicine

## 2022-06-26 DIAGNOSIS — J209 Acute bronchitis, unspecified: Secondary | ICD-10-CM | POA: Diagnosis not present

## 2022-06-26 DIAGNOSIS — F17200 Nicotine dependence, unspecified, uncomplicated: Secondary | ICD-10-CM | POA: Diagnosis not present

## 2022-06-26 DIAGNOSIS — Z1152 Encounter for screening for COVID-19: Secondary | ICD-10-CM | POA: Insufficient documentation

## 2022-06-26 DIAGNOSIS — R0602 Shortness of breath: Secondary | ICD-10-CM | POA: Diagnosis present

## 2022-06-26 DIAGNOSIS — J45909 Unspecified asthma, uncomplicated: Secondary | ICD-10-CM | POA: Diagnosis not present

## 2022-06-26 LAB — RESP PANEL BY RT-PCR (RSV, FLU A&B, COVID)  RVPGX2
Influenza A by PCR: NEGATIVE
Influenza B by PCR: NEGATIVE
Resp Syncytial Virus by PCR: NEGATIVE
SARS Coronavirus 2 by RT PCR: NEGATIVE

## 2022-06-26 MED ORDER — PREDNISONE 20 MG PO TABS
40.0000 mg | ORAL_TABLET | Freq: Once | ORAL | Status: AC
  Administered 2022-06-26: 40 mg via ORAL
  Filled 2022-06-26: qty 2

## 2022-06-26 MED ORDER — ALBUTEROL SULFATE (2.5 MG/3ML) 0.083% IN NEBU
2.5000 mg | INHALATION_SOLUTION | Freq: Once | RESPIRATORY_TRACT | Status: AC
  Administered 2022-06-26: 2.5 mg via RESPIRATORY_TRACT
  Filled 2022-06-26: qty 3

## 2022-06-26 MED ORDER — PREDNISONE 20 MG PO TABS: 60.0000 mg | ORAL_TABLET | Freq: Every day | ORAL | 0 refills | Status: DC

## 2022-06-26 MED ORDER — IPRATROPIUM-ALBUTEROL 0.5-2.5 (3) MG/3ML IN SOLN
3.0000 mL | Freq: Once | RESPIRATORY_TRACT | Status: AC
  Administered 2022-06-26: 3 mL via RESPIRATORY_TRACT
  Filled 2022-06-26: qty 3

## 2022-06-26 NOTE — ED Triage Notes (Signed)
Pt arrived via [facility] from home c/o sob, Hx of asthma, went to urgent care yesterday and we Rx prednisone and breathing treatment, pt is not normally on oxygen at baseline, SpO2 97% on RA , states she took 10 puffs of her inhaler

## 2022-06-26 NOTE — ED Provider Notes (Signed)
[facility] Provider Note   CSN: [id number] Arrival date & time: 06/26/22  1124     History  Chief Complaint  Patient presents with   Shortness of Breath    Dawn Meyer is a 67 y.o. female.  HPI 67 year old female presents with shortness of breath and chest tightness.  Originally started having symptoms 5 days ago. Felt like a typical sinus infection. Now it feels like it's in her chest and her chest is tight. Went to urgent care yesterday, where they gave her a breathing treatment (with some short-term relief) as well as prednisone and Augmentin.  Today when she got out of the shower and felt really short of breath and took about 10 puffs of her albuterol inhaler.  Feels like she is hearing some wheezing.  She took her 60 mg prednisone yesterday but only took 20 of the 50 today because she wanted to save some for tonight because at night it is worse. Is a current smoker. States she has a history of "slight asthma".  Home Medications Prior to Admission medications   Medication Sig Start Date End Date Taking? Authorizing Provider  Acetaminophen (TYLENOL ARTHRITIS PAIN PO) Take by mouth as needed.   Yes [provider]  albuterol (VENTOLIN HFA) 108 (90 Base) MCG/ACT inhaler INHALE TWO PUFFS INTO THE LUNGS EVERY SIX HOURS AS NEEDED FOR WHEEZING OR SHORTNESS OF BREATH 04/15/22  Yes Cook, Jayce G, DO  amLODipine (NORVASC) 5 MG tablet Take 1 tablet (5 mg total) by mouth daily. 10/10/21  Yes Cook, Jayce G, DO  amoxicillin-clavulanate (AUGMENTIN) 875-125 MG tablet Take 1 tablet by mouth 2 (two) times daily. 05/09/22  Yes Cook, Jayce G, DO  HUMIRA, 2 PEN, 40 MG/0.4ML PNKT INJECT 1 PEN UNDER THE SKIN EVERY 14 DAYS. 06/06/22  Yes Ofilia Neas, PA-C  NIACINAMIDE PO Take 1 tablet by mouth daily.   Yes [provider]  Polypodium Leucotomos (HELIOCARE PO) Take 1 tablet by mouth daily as needed (sun exposure).   Yes [provider]  budesonide (PULMICORT) 180 MCG/ACT inhaler Inhale 2 puffs into the lungs in the morning and at bedtime. Patient not taking: Reported on 05/29/2022 04/23/22   Coral Spikes, DO  HYDROcodone bit-homatropine (HYCODAN) 5-1.5 MG/5ML syrup Take 5 mLs by mouth every 8 (eight) hours as needed for cough. Patient not taking: Reported on 05/29/2022 05/09/22   Coral Spikes, DO  predniSONE (DELTASONE) 20 MG tablet Take 3 tablets (60 mg total) by mouth daily for 3 days. 06/27/22 06/30/22  Sherwood Gambler, MD      Allergies    Doxycycline    Review of Systems   Review of Systems  Constitutional:  Negative for fever.  HENT:  Positive for congestion.   Respiratory:  Positive for chest tightness and shortness of breath.     Physical Exam Updated Vital Signs BP (!) 142/78   Pulse 83   Temp 98.3 F (36.8 C) (Oral)   Resp 20   Ht 5' 6.5" (1.689 m)   Wt 67.6 kg   SpO2 97%   BMI 23.69 kg/m  Physical Exam Vitals and nursing note reviewed.  Constitutional:      Appearance: She is well-developed.  HENT:     Head: Normocephalic and atraumatic.  Cardiovascular:     Rate and Rhythm: Normal rate and regular rhythm.     Heart sounds: Normal heart sounds.  Pulmonary:     Effort: Pulmonary effort is  normal. No tachypnea, accessory muscle usage or respiratory distress.     Breath sounds: Wheezing (diffuse, expiratory) present.  Abdominal:     General: There is no distension.  Skin:    General: Skin is warm and dry.  Neurological:     Mental Status: She is alert.     ED Results / Procedures / Treatments   Labs (all labs ordered are listed, but only abnormal results are displayed) Labs Reviewed  RESP PANEL BY RT-PCR (RSV, FLU A&B, COVID)  [id number]    EKG None  Radiology DG Chest Portable 1 View  Result Date: 06/26/2022 CLINICAL DATA:  Chest tightness, cough and congestion. EXAM: PORTABLE CHEST 1 VIEW COMPARISON:  02/07/2009 FINDINGS: Hyperexpanded lungs with prominent interstitial  markings, suggestive of emphysema. No focal airspace opacity. Mild bronchial wall thickening. Normal heart size and mediastinal contours with atherosclerotic calcifications of the aortic arch. No pleural effusion or pneumothorax. Visualized bones and upper abdomen are unremarkable. IMPRESSION: 1. No focal airspace opacity. 2. Mild bronchial wall thickening with hyperexpanded lungs and prominent interstitial markings, suggestive of COPD. 3.  Aortic Atherosclerosis (ICD10-I70.0). Electronically Signed   By: Emmit Alexanders M.D.   On: 06/26/2022 12:29    Procedures Procedures    Medications Ordered in ED Medications  ipratropium-albuterol (DUONEB) 0.5-2.5 (3) MG/3ML nebulizer solution 3 mL (3 mLs Nebulization Given 06/26/22 1252)  albuterol (PROVENTIL) (2.5 MG/3ML) 0.083% nebulizer solution 2.5 mg (2.5 mg Nebulization Given 06/26/22 1252)  predniSONE (DELTASONE) tablet 40 mg (40 mg Oral Given 06/26/22 1428)    ED Course/ Medical Decision Making/ A&P                             Medical Decision Making Amount and/or Complexity of Data Reviewed Labs:     Details: Covid/flu/rsv negative Radiology: ordered and independent interpretation performed.    Details: No Pneumonia  Risk Prescription drug management.   Patient was given a DuoNeb and she feels a little better.  Still has a little bit of wheezing but no hypoxia or increased work of breathing.  She feels like she is stable enough for home.  My suspicion is low that she has occult pneumonia.  She was already put on antibiotics for sinusitis.  She was put on steroids though given an immediate taper starting at 60 mg.  I discussed since we only need to treat her for a few days it would be reasonable to just do a burst and she would prefer this.  She only took 20 mg this morning so I have given her 40 more here.  She reports that she typically needs higher doses than 40 mg at home so we will give 3 more days of 60 and have her follow-up with PCP.  She  has an inhaler.  Otherwise low suspicion for ACS, PE, etc.  Given return precautions.        Final Clinical Impression(s) / ED Diagnoses Final diagnoses:  Acute bronchitis, unspecified organism    Rx / DC Orders ED Discharge Orders          Ordered    predniSONE (DELTASONE) 20 MG tablet  Daily,   Status:  Discontinued        06/26/22 1416    predniSONE (DELTASONE) 20 MG tablet  Daily        06/26/22 1418              Sherwood Gambler, MD 06/26/22 1523

## 2022-06-26 NOTE — Discharge Instructions (Addendum)
We are changing your steroid prescription. Start the prednisone tomorrow. Continue to use albuterol 1-2 puffs every 4 hours. If you need it significantly more than this, develop a fever, coughing up blood, trouble breathing, or any other new/concerning symptoms then return to the ER or call 911

## 2022-06-27 ENCOUNTER — Telehealth: Payer: Self-pay

## 2022-06-27 ENCOUNTER — Telehealth (HOSPITAL_COMMUNITY): Payer: Self-pay | Admitting: Emergency Medicine

## 2022-06-27 MED ORDER — PREDNISONE 20 MG PO TABS: 60.0000 mg | ORAL_TABLET | Freq: Every day | ORAL | 0 refills | Status: AC

## 2022-06-27 NOTE — Telephone Encounter (Signed)
Patient reports that prednisone was not sent to her pharmacy.  Will send prednisone again at this time to pharmacy she has specified.

## 2022-06-27 NOTE — Telephone Encounter (Signed)
Transition Care Management Follow-up Telephone Call Date of discharge and from where: [name] 06/26/2022 How have you been since you were released from the hospital? same Any questions or concerns? No  Items Reviewed: Did the pt receive and understand the discharge instructions provided? Yes  Medications obtained and verified? Yes  Other? No  Any new allergies since your discharge? No  Dietary orders reviewed? Yes Do you have support at home? Yes   Home Care and Equipment/Supplies: Were home health services ordered? no If so, what is the name of the agency? N/a  Has the agency set up a time to come to the patient's home? no Were any new equipment or medical supplies ordered?  No What is the name of the medical supply agency? N/a Were you able to get the supplies/equipment? no Do you have any questions related to the use of the equipment or supplies? No  Functional Questionnaire: (I = Independent and D = Dependent) ADLs: I  Bathing/Dressing- I  Meal Prep- I  Eating- I  Maintaining continence- I  Transferring/Ambulation- I  Managing Meds- I  Follow up appointments reviewed:  PCP Hospital f/u appt confirmed? Yes  Scheduled to see Dawn Meyer on 07/01/2022 @ 3:10. [facility] f/u appt confirmed? No   Are transportation arrangements needed? No  If their condition worsens, is the pt aware to call PCP or go to the Emergency Dept.? Yes Was the patient provided with contact information for the PCP's office or ED? Yes Was to pt encouraged to call back with questions or concerns? Yes Dawn Crumble, LPN [place] Direct Dial [phone]

## 2022-07-01 ENCOUNTER — Ambulatory Visit (INDEPENDENT_AMBULATORY_CARE_PROVIDER_SITE_OTHER): Payer: BC Managed Care – PPO | Admitting: Family Medicine

## 2022-07-01 ENCOUNTER — Encounter: Payer: Self-pay | Admitting: Family Medicine

## 2022-07-01 VITALS — BP 138/82 | HR 65 | Temp 98.2°F | Ht 66.5 in | Wt 148.0 lb

## 2022-07-01 DIAGNOSIS — J449 Chronic obstructive pulmonary disease, unspecified: Secondary | ICD-10-CM

## 2022-07-01 DIAGNOSIS — Z720 Tobacco use: Secondary | ICD-10-CM | POA: Diagnosis not present

## 2022-07-01 MED ORDER — TRELEGY ELLIPTA 100-62.5-25 MCG/ACT IN AEPB: 1.0000 | INHALATION_SPRAY | Freq: Every day | RESPIRATORY_TRACT | 11 refills | Status: DC

## 2022-07-01 NOTE — Patient Instructions (Addendum)
Work on smoking cessation.  Trelegy as prescribed.  Take care  Dr. Lacinda Axon

## 2022-07-02 DIAGNOSIS — J449 Chronic obstructive pulmonary disease, unspecified: Secondary | ICD-10-CM | POA: Insufficient documentation

## 2022-07-02 NOTE — Assessment & Plan Note (Addendum)
Trial of Trelegy.  Advised that she needs to quit smoking.

## 2022-07-02 NOTE — Assessment & Plan Note (Signed)
Discussed smoking cessation.  Offered pharmacotherapy.  Patient declines at this time.

## 2022-07-02 NOTE — Progress Notes (Signed)
Subjective:  Patient ID: Dawn Meyer, female    DOB: 06/08/1955  Age: 67 y.o. MRN: [id number]  CC: Chief Complaint  Patient presents with   bronchitis follow up     ER follow up from last week still some cough w/ wheeze on antibiotics and prednisone    HPI:  67 year old female with tobacco abuse presents for follow-up.  Patient seen in the ER and diagnosed and treated for bronchitis.  Imaging consistent with COPD.   Patient is finishing antibiotic course and steroids.  She is improving.  Patient is with her daughter today.  They would like to discuss smoking cessation and her COPD.  Patient Active Problem List   Diagnosis Date Noted   COPD (chronic obstructive pulmonary disease) ([place]) 07/02/2022   Essential hypertension 10/10/2021   Asthma    Allergic rhinitis 11/17/2017   Tobacco abuse 11/17/2017   Rheumatoid arthritis involving multiple sites with positive rheumatoid factor ([place]) 08/18/2016   OP (osteoporosis) 08/18/2016    Social Hx   Social History   Socioeconomic History   Marital status: Married    Spouse name: Not on file   Number of children: Not on file   Years of education: Not on file   Highest education level: Not on file  Occupational History   Not on file  Tobacco Use   Smoking status: Every Day    Years: 40.00    Types: Cigarettes    Passive exposure: Never   Smokeless tobacco: Never  Vaping Use   Vaping Use: Never used  Substance and Sexual Activity   Alcohol use: No   Drug use: No   Sexual activity: Not on file  Other Topics Concern   Not on file  Social History Narrative   Not on file   Social Determinants of Health   Financial Resource Strain: Not on file  Food Insecurity: Not on file  Transportation Needs: Not on file  Physical Activity: Not on file  Stress: Not on file  Social Connections: Not on file    Review of Systems Per HPI  Objective:  BP 138/82   Pulse 65   Temp 98.2 F (36.8 C)   Ht 5' 6.5" (1.689 m)    Wt 148 lb (67.1 kg)   SpO2 96%   BMI 23.53 kg/m      07/01/2022    4:01 PM 07/01/2022    3:29 PM 07/01/2022    3:28 PM  BP/Weight  Systolic BP 101 751 025  Diastolic BP 82 72 82  Wt. (Lbs)   148  BMI   23.53 kg/m2    Physical Exam Vitals and nursing note reviewed.  Constitutional:      General: She is not in acute distress.    Appearance: Normal appearance.  HENT:     Head: Normocephalic and atraumatic.  Cardiovascular:     Rate and Rhythm: Normal rate and regular rhythm.  Pulmonary:     Effort: Pulmonary effort is normal.     Breath sounds: Wheezing present.  Neurological:     Mental Status: She is alert.  Psychiatric:        Mood and Affect: Mood normal.        Behavior: Behavior normal.     Lab Results  Component Value Date   WBC 9.7 05/29/2022   HGB 13.7 05/29/2022   HCT 40.0 05/29/2022   PLT 194 05/29/2022   GLUCOSE 73 05/29/2022   CHOL 172 01/08/2021   TRIG 122 01/08/2021  HDL 47 01/08/2021   LDLCALC 103 (H) 01/08/2021   ALT 10 05/29/2022   AST 11 05/29/2022   NA 143 05/29/2022   K 4.1 05/29/2022   CL 109 05/29/2022   CREATININE 0.87 05/29/2022   BUN 13 05/29/2022   CO2 26 05/29/2022   TSH 0.702 09/12/2017     Assessment & Plan:   Problem List Items Addressed This Visit       Respiratory   COPD (chronic obstructive pulmonary disease) ([place]) - Primary    Trial of Trelegy.  Advised that she needs to quit smoking.      Relevant Medications   Fluticasone-Umeclidin-Vilant (TRELEGY ELLIPTA) 100-62.5-25 MCG/ACT AEPB     Other   Tobacco abuse    Discussed smoking cessation.  Offered pharmacotherapy.  Patient declines at this time.       Meds ordered this encounter  Medications   Fluticasone-Umeclidin-Vilant (TRELEGY ELLIPTA) 100-62.5-25 MCG/ACT AEPB    Sig: Inhale 1 puff into the lungs daily.    Dispense:  1 each    Refill:  [place]

## 2022-07-28 ENCOUNTER — Ambulatory Visit: Payer: BC Managed Care – PPO | Admitting: Podiatry

## 2022-07-28 DIAGNOSIS — Q828 Other specified congenital malformations of skin: Secondary | ICD-10-CM | POA: Diagnosis not present

## 2022-07-28 DIAGNOSIS — M216X1 Other acquired deformities of right foot: Secondary | ICD-10-CM

## 2022-07-28 NOTE — Progress Notes (Signed)
Subjective: Chief Complaint  Patient presents with   Callouses    Bilateral feet, ball of foot     67 year old female presents the office with concern of bilateral foot pain, callus formation.  There is started hurting again.  Denies any swelling redness or drainage.  Denies any fevers or chills.  Objective: AAO x3, NAD DP/PT pulses palpable bilaterally, CRT less than 3 seconds Hyperkeratotic lesions noted submetatarsal 3 on the right foot as well as left plantar hallux.  There is callus formation present submetatarsal 5 on the left foot.  There is no underlying ulceration drainage or any signs of infection noted today.  Prominent metatarsal head.  Bunion present.  Hallux extensors left side.  No pain with calf compression, swelling, warmth, erythema  Assessment: Hyperkeratotic lesions to digital deformity, prominent metatarsal heads  Plan: -All treatment options discussed with the patient including all alternatives, risks, complications.  -Sharply debrided the hyperkeratotic lesions with any complications x 3. Continue moisturizer and offloading daily.  Discussed offloading pads and shoe modifications.  Monitoring skin breakdown or signs of infection. -Patient encouraged to call the office with any questions, concerns, change in symptoms.   Trula Slade DPM

## 2022-08-01 ENCOUNTER — Telehealth: Payer: Self-pay | Admitting: *Deleted

## 2022-08-01 ENCOUNTER — Other Ambulatory Visit: Payer: Self-pay | Admitting: Physician Assistant

## 2022-08-01 DIAGNOSIS — M0579 Rheumatoid arthritis with rheumatoid factor of multiple sites without organ or systems involvement: Secondary | ICD-10-CM

## 2022-08-01 NOTE — Telephone Encounter (Signed)
Patient will need to switch to [facility] biosimilar. Patient's PA for Humira will end on 08/25/2022. Humira refill is due in a couple of weeks (last filled 07/23/2022). Allyson or I will call pt to discuss  Dawn Meyer, PharmD, MPH, BCPS, CPP Clinical Pharmacist (Rheumatology and Pulmonology)

## 2022-08-01 NOTE — Telephone Encounter (Signed)
[facility] called, Humira off formulary 08/25/2022 PA expiring, needs covered alternative or coverage exception.

## 2022-08-06 ENCOUNTER — Telehealth: Payer: Self-pay | Admitting: Pharmacist

## 2022-08-06 ENCOUNTER — Telehealth: Payer: Self-pay

## 2022-08-06 DIAGNOSIS — M0579 Rheumatoid arthritis with rheumatoid factor of multiple sites without organ or systems involvement: Secondary | ICD-10-CM

## 2022-08-06 NOTE — Telephone Encounter (Signed)
Submitted a Prior Authorization request to [facility] for HYRIMOZ via CoverMyMeds. Pending clinical questions to populate  Key: [id number]  Once approved will ned to enroll pt into copay card and send rx to [place]. Patient will also need to be contacted  Knox Saliva, PharmD, MPH, BCPS, CPP Clinical Pharmacist (Rheumatology and Pulmonology)

## 2022-08-06 NOTE — Telephone Encounter (Signed)
Received notification from patient's insurance that [place] will no longer be preferred adalimumab product on formulary. As of 08/25/2022, patient's insurance will prefer the following biosimilar(s): HYRIMOZ (Adalimumab-adaz) . Until this date patient will be able to fill Humira, but after this date, cost of Humira will increase for patient.  Discussed the switch to biosimilar with the patient. Reviewed that biosimilars are as clinically effective, as safe, and no more harmful than the originator product based on FDA studies. They are similar to generics but are different at molecular level and some require physician approval for the switch. Reviewed that biosimilars are FDA-approved and that studies are completed in patients who are clinically established on Humira and switched to biosimiliar. Discussed that dose and frequency are the same as for Humira.  Patient is in agreement to biosimilar switch from Humira to  [facility] Pam[facility] Hurry). Pharmacy team will proceed with benefits investigation.   New start visit will not be needed for switch to HYRIMOZ Pamala Hurry). However advised patient that if any issues with biosimilar injector device, patient should contact clinic and we can schedule training visit with pharmacy team  Maryan Puls, PharmD PGY-1 [facility] Pharm[facility] Resident

## 2022-08-07 ENCOUNTER — Other Ambulatory Visit (HOSPITAL_COMMUNITY): Payer: Self-pay

## 2022-08-07 NOTE — Telephone Encounter (Signed)
Received notification from [facility] regarding a prior authorization for  HYRIMOZ . Authorization has been APPROVED from 08/07/22 to 08/07/23. Approval letter sent to scan center.  Patient must continue to fill through [facility]: [phone]  Authorization # [id number]  Attempted to enroll patient into Hyrimoz copay card but requires email address. ATC patient. Will f/u next week  Knox Saliva, PharmD, MPH, BCPS, CPP Clinical Pharmacist (Rheumatology and Pulmonology)

## 2022-08-07 NOTE — Telephone Encounter (Signed)
Clinical questions completed for Hyrimoz prior authorization  Knox Saliva, PharmD, MPH, BCPS, CPP Clinical Pharmacist (Rheumatology and Pulmonology)

## 2022-08-12 MED ORDER — ADALIMUMAB-ADAZ 40 MG/0.4ML ~~LOC~~ SOAJ: 40.0000 mg | SUBCUTANEOUS | 0 refills | Status: DC

## 2022-08-12 NOTE — Telephone Encounter (Signed)
Copay card for Hyrimoz obtained.     Maryan Puls, PharmD PGY-1 [facility] Resident

## 2022-08-12 NOTE — Telephone Encounter (Signed)
Patient provided email for copay card enrollment for Hyrimoz ([email or web address]). Allyson enrolled patient into copay card.  Called pt and provided her with copay card information for brand name Hyrimoz: BIN: [id number] Group: [id number] ID: [id number]  Patient advised [facility] and provide w savings card information. She is aware that she is due to update labs next month. She will reach out to [place] with any questions or concerns  Knox Saliva, PharmD, MPH, BCPS, CPP Clinical Pharmacist (Rheumatology and Pulmonology)

## 2022-09-17 ENCOUNTER — Other Ambulatory Visit: Payer: Self-pay | Admitting: Rheumatology

## 2022-09-17 DIAGNOSIS — M0579 Rheumatoid arthritis with rheumatoid factor of multiple sites without organ or systems involvement: Secondary | ICD-10-CM

## 2022-09-17 NOTE — Telephone Encounter (Signed)
Last Fill: 08/12/2022 (30 day supply)  Labs: 05/29/2022 Absolute eosinophils are elevated. Rest of CBC WNL.  CMP WNL.   TB Gold: 07/25/2021 Neg   Next Visit: 11/19/2022  Last Visit: 05/29/2022  [id number] arthritis involving multiple sites with positive rheumatoid factor   Current Dose per office note 05/29/2022: Humira 40 mg sq injections every 14 days.   Patient advised she is due to update her labs.   Okay to refill Hyrimoz?

## 2022-10-14 ENCOUNTER — Ambulatory Visit: Payer: BC Managed Care – PPO | Admitting: Family Medicine

## 2022-10-21 ENCOUNTER — Other Ambulatory Visit: Payer: Self-pay | Admitting: Family Medicine

## 2022-10-25 DIAGNOSIS — C801 Malignant (primary) neoplasm, unspecified: Secondary | ICD-10-CM

## 2022-10-25 HISTORY — DX: Malignant (primary) neoplasm, unspecified: C80.1

## 2022-10-28 ENCOUNTER — Ambulatory Visit: Payer: BC Managed Care – PPO | Admitting: Podiatry

## 2022-10-28 DIAGNOSIS — Q828 Other specified congenital malformations of skin: Secondary | ICD-10-CM

## 2022-10-29 ENCOUNTER — Other Ambulatory Visit: Payer: Self-pay | Admitting: Rheumatology

## 2022-10-29 DIAGNOSIS — M0579 Rheumatoid arthritis with rheumatoid factor of multiple sites without organ or systems involvement: Secondary | ICD-10-CM

## 2022-11-06 NOTE — Progress Notes (Signed)
Office Visit Note  Patient: Dawn Meyer             Date of Birth: 03/19/1956           MRN: [id number]             PCP: Tommie Sams, DO Referring: Tommie Sams, DO Visit Date: 11/19/2022 Occupation: @GUAROCC @  Subjective:  Joint stiffness  History of Present Illness: Dawn Meyer is a 67 y.o. female with seropositive rheumatoid arthritis, osteoarthritis and osteoporosis.  She states she has not had a flare of rheumatoid arthritis.  She has been on Hyrimoz injections since March 2024.  She has not noticed any difference from taking Humira injections.  She has been taking hide there was 40 mg subcu every other week without interruption.  According the patient she is renovating her home and it is strenuous on her joints and her back.  She was taking anti-inflammatories to relieve some of the discomfort.  Since her lab work in June 2024 she had to stop taking NSAIDs due to elevated creatinine.  She is also trying to quit smoking which has been difficult for her.  She states she has gained some weight and is working on losing the weight again.    Activities of Daily Living:  Patient reports morning stiffness for 0 minutes.   Patient Denies nocturnal pain.  Difficulty dressing/grooming: Denies Difficulty climbing stairs: Denies Difficulty getting out of chair: Denies Difficulty using hands for taps, buttons, cutlery, and/or writing: Denies  Review of Systems  Constitutional:  Negative for fatigue.  HENT:  Negative for mouth sores and mouth dryness.   Eyes:  Negative for dryness.  Respiratory:  Negative for shortness of breath.   Cardiovascular:  Negative for chest pain and palpitations.  Gastrointestinal:  Negative for blood in stool, constipation and diarrhea.  Endocrine: Negative for increased urination.  Genitourinary:  Negative for involuntary urination.  Musculoskeletal:  Positive for joint pain, joint pain and joint swelling. Negative for gait problem, myalgias,  muscle weakness, morning stiffness, muscle tenderness and myalgias.  Skin:  Negative for color change, rash, hair loss and sensitivity to sunlight.  Allergic/Immunologic: Negative for susceptible to infections.  Neurological:  Negative for dizziness and headaches.  Hematological:  Negative for swollen glands.  Psychiatric/Behavioral:  Negative for depressed mood and sleep disturbance. The patient is not nervous/anxious.     PMFS History:  Patient Active Problem List   Diagnosis Date Noted   COPD (chronic obstructive pulmonary disease) (HCC) 07/02/2022   Essential hypertension 10/10/2021   Asthma    Allergic rhinitis 11/17/2017   Tobacco abuse 11/17/2017   Rheumatoid arthritis involving multiple sites with positive rheumatoid factor (HCC) 08/18/2016   OP (osteoporosis) 08/18/2016    Past Medical History:  Diagnosis Date   Asthma    Prolapsed uterus    per patient, dx by OB-GYN   RA (rheumatoid arthritis) (HCC)     Family History  Problem Relation Age of Onset   Congestive Heart Failure Mother    Heart attack Mother    Diabetes Father    Heart Problems Father        pacemaker   Diverticulitis Sister    Hypertension Sister    Heart attack Brother    Healthy Daughter    Colon cancer Neg Hx    Past Surgical History:  Procedure Laterality Date   COLONOSCOPY WITH PROPOFOL N/A 08/09/2021   Procedure: COLONOSCOPY WITH PROPOFOL;  Surgeon: Corbin Ade, MD;  Location: [facility];  Service: Endoscopy;  Laterality: N/A;  7:30am   [place] procedure  04/28/2022   for prolapsed cervix   MOHS SURGERY     SQUAMOUS CELL CARCINOMA EXCISION     VAGINAL DELIVERY     Social History   Social History Narrative   Not on file   Immunization History  Administered Date(s) Administered   Influenza Inj Mdck Quad Pf 03/12/2017   Influenza-Unspecified 03/20/2016, 03/11/2018, 03/13/2020, 03/16/2021, 03/19/2022   Janssen (J&J) SARS-COV-2 Vaccination 08/31/2019   Td 08/28/2009      Objective: Vital Signs: BP (!) 152/78 (BP Location: Left Arm, Patient Position: Sitting, Cuff Size: Normal)   Pulse 68   Resp 14   Ht 5' 6.5" (1.689 m)   Wt 157 lb 3.2 oz (71.3 kg)   BMI 24.99 kg/m    Physical Exam Vitals and nursing note reviewed.  Constitutional:      Appearance: She is well-developed.  HENT:     Head: Normocephalic and atraumatic.  Eyes:     Conjunctiva/sclera: Conjunctivae normal.  Cardiovascular:     Rate and Rhythm: Normal rate and regular rhythm.     Heart sounds: Normal heart sounds.  Pulmonary:     Effort: Pulmonary effort is normal.     Breath sounds: Normal breath sounds.  Abdominal:     General: Bowel sounds are normal.     Palpations: Abdomen is soft.  Musculoskeletal:     Cervical back: Normal range of motion.  Lymphadenopathy:     Cervical: No cervical adenopathy.  Skin:    General: Skin is warm and dry.     Capillary Refill: Capillary refill takes less than 2 seconds.  Neurological:     Mental Status: She is alert and oriented to person, place, and time.  Psychiatric:        Behavior: Behavior normal.      Musculoskeletal Exam: She has good range of motion of the cervical spine.  Thoracic kyphosis was noted.  There was no tenderness over thoracic or lumbar spine.  Shoulders, elbows, wrist, MCPs PIPs and DIPs were in good range of motion.  She has synovial thickening over the right third MCP joint with mild synovitis.  PIP and DIP thickening was noted.  Hip joints and knee joints were in good range of motion without any warmth swelling or effusion.  There was no tenderness over ankles or MTPs.  CDAI Exam: CDAI Score: 10  Patient Global: 40 / 100; Provider Global: 40 / 100 Swollen: 1 ; Tender: 1  Joint Exam 11/19/2022      Right  Left  MCP 3  Swollen Tender        Investigation: No additional findings.  Imaging: No results found.  Recent Labs: Lab Results  Component Value Date   WBC 8.2 11/11/2022   HGB 13.3 11/11/2022    PLT 166 11/11/2022   NA 141 11/11/2022   K 4.3 11/11/2022   CL 106 11/11/2022   CO2 21 11/11/2022   GLUCOSE 92 11/11/2022   BUN 16 11/11/2022   CREATININE 1.06 (H) 11/11/2022   BILITOT 0.4 11/11/2022   ALKPHOS 135 (H) 11/11/2022   AST 14 11/11/2022   ALT 12 11/11/2022   PROT 6.5 11/11/2022   ALBUMIN 4.2 11/11/2022   CALCIUM 9.2 11/11/2022   GFRAA 78 03/13/2020   QFTBGOLD Negative 07/29/2016   QFTBGOLDPLUS Negative 11/11/2022    Speciality Comments: Osteoporosis manged by Dr. Corliss Skains.  She declines treatment-ACY 01/11/2019  Procedures:  No procedures performed Allergies: Doxycycline   Assessment / Plan:     Visit Diagnoses: Rheumatoid arthritis involving multiple sites with positive rheumatoid factor (HCC)-patient states she has not had any flares since the last visit.  She continues to have some pain and swelling in her right third MCP joint.  She relates it to renovating her home and lifting heavy objects.  She has been on high p.o. 40 mg subcu every 14 days since March 2024.  She has difficulty with the fine motor movements like buttoning.  High risk medication use - Hyrimoz 40 mg sq injections every 14 days.  Labs from November 11, 2022 CBC and CMP were normal except creatinine was elevated at 1.06.  Patient states she was taking a lot of NSAIDs due to lower back pain from lifting objects.  She is a stopped NSAIDs now.  TB Gold was negative on November 11, 2022.  She was advised to get labs in September and every 3 months.  Information on immunization was placed in the AVS.  She was advised to hold her because if she develops an infection and resume after the infection resolves.  Increased risk of skin cancer and annual skin examination was advised.  Use of sunscreen and sun protection was advised.  Primary osteoarthritis of both hands-she is due to arthritis and osteoarthritis overlap.  Joint protection was discussed.  Primary osteoarthritis of both feet-she denies any discomfort in  her feet today.  Age-related osteoporosis without current pathological fracture - DEXA 01/19/14 left forearm at the 33% radius site, BMD 0.588 T-score -3.3.  Patient does not want to have a DEXA scan.  Vitamin D deficiency-use of vitamin D was advised.  Essential hypertension-blood pressure is elevated at 152/78 today.  She was advised to monitor blood pressure closely and follow-up with her PCP.  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  She sees dermatologist twice a year.  History of asthma  Smoker-patient is trying to quit smoking.  She states she is having difficult time and has gained some weight.  Orders: No orders of the defined types were placed in this encounter.  No orders of the defined types were placed in this encounter.    Follow-Up Instructions: Return in about 5 months (around 04/21/2023) for Rheumatoid arthritis, Osteoarthritis.   Pollyann Savoy, MD  Note - This record has been created using Animal nutritionist.  Chart creation errors have been sought, but may not always  have been located. Such creation errors do not reflect on  the standard of medical care.

## 2022-11-07 NOTE — Progress Notes (Signed)
Subjective: Chief Complaint  Patient presents with   Callouses    Patient came in today for Bilateral callus trim     67 year old female presents the office with concern of bilateral foot pain, callus formation.  No open lesions or swelling, redness/signs of infection. No new concerns.   Objective: AAO x3, NAD DP/PT pulses palpable bilaterally, CRT less than 3 seconds Hyperkeratotic lesions noted submetatarsal 3 on the right foot as well as left plantar hallux.  There is callus formation present submetatarsal 5 on the left foot.  There is no underlying ulceration drainage or any signs of infection noted today.  Prominent metatarsal head.  Bunion noted.   No pain with calf compression, swelling, warmth, erythema  Assessment: Hyperkeratotic lesions to digital deformity, prominent metatarsal heads  Plan: -All treatment options discussed with the patient including all alternatives, risks, complications.  -Sharply debrided the hyperkeratotic lesions with any complications x 3. Continue moisturizer and offloading daily.  Discussed offloading pads and shoe modifications.  Monitoring skin breakdown or signs of infection. -Patient encouraged to call the office with any questions, concerns, change in symptoms.   Vivi Barrack DPM

## 2022-11-11 ENCOUNTER — Telehealth: Payer: Self-pay | Admitting: Rheumatology

## 2022-11-11 ENCOUNTER — Ambulatory Visit: Payer: BC Managed Care – PPO | Admitting: Family Medicine

## 2022-11-11 ENCOUNTER — Encounter: Payer: Self-pay | Admitting: Family Medicine

## 2022-11-11 VITALS — BP 156/77 | HR 69 | Temp 97.9°F | Ht 66.5 in | Wt 157.0 lb

## 2022-11-11 DIAGNOSIS — Z111 Encounter for screening for respiratory tuberculosis: Secondary | ICD-10-CM

## 2022-11-11 DIAGNOSIS — J449 Chronic obstructive pulmonary disease, unspecified: Secondary | ICD-10-CM | POA: Diagnosis not present

## 2022-11-11 DIAGNOSIS — I10 Essential (primary) hypertension: Secondary | ICD-10-CM | POA: Diagnosis not present

## 2022-11-11 DIAGNOSIS — Z79899 Other long term (current) drug therapy: Secondary | ICD-10-CM

## 2022-11-11 MED ORDER — AMLODIPINE BESYLATE 10 MG PO TABS: 10.0000 mg | ORAL_TABLET | Freq: Every day | ORAL | 3 refills | Status: DC

## 2022-11-11 NOTE — Progress Notes (Signed)
Subjective:  Patient ID: Dawn Meyer, female    DOB: 01/26/1956  Age: 67 y.o. MRN: [id number]  CC: Chief Complaint  Patient presents with   Hypertension    6 month follow up    HPI:  67 year old female with above-mentioned medical problems presents for follow-up.  Patient states that she is doing well on Trelegy.  She is very happy with the response that she has had.  She states that it works well.  Patient continues to smoke.  Once again I have advised smoking cessation.  Patient's blood pressure is elevated here today.  She is currently on amlodipine 5 mg daily.  Continues to follow closely with rheumatology.  Patient has underlying osteoporosis for which she has declined treatment.  Patient Active Problem List   Diagnosis Date Noted   COPD (chronic obstructive pulmonary disease) (HCC) 07/02/2022   Essential hypertension 10/10/2021   Asthma    Allergic rhinitis 11/17/2017   Tobacco abuse 11/17/2017   Rheumatoid arthritis involving multiple sites with positive rheumatoid factor (HCC) 08/18/2016   OP (osteoporosis) 08/18/2016    Social Hx   Social History   Socioeconomic History   Marital status: Married    Spouse name: Not on file   Number of children: Not on file   Years of education: Not on file   Highest education level: Not on file  Occupational History   Not on file  Tobacco Use   Smoking status: Every Day    Packs/day: 0.15    Years: 40.00    Additional pack years: 0.00    Total pack years: 6.00    Types: Cigarettes    Passive exposure: Never   Smokeless tobacco: Never  Vaping Use   Vaping Use: Never used  Substance and Sexual Activity   Alcohol use: No   Drug use: No   Sexual activity: Not on file  Other Topics Concern   Not on file  Social History Narrative   Not on file   Social Determinants of Health   Financial Resource Strain: Not on file  Food Insecurity: Not on file  Transportation Needs: Not on file  Physical Activity: Not  on file  Stress: Not on file  Social Connections: Not on file    Review of Systems  Constitutional: Negative.   Cardiovascular: Negative.    Objective:  BP (!) 156/77   Pulse 69   Temp 97.9 F (36.6 C)   Ht 5' 6.5" (1.689 m)   Wt 157 lb (71.2 kg)   SpO2 100%   BMI 24.96 kg/m      11/11/2022   11:33 AM 11/11/2022   11:29 AM 07/01/2022    4:01 PM  BP/Weight  Systolic BP 156 171 138  Diastolic BP 77 89 82  Wt. (Lbs)  157   BMI  24.96 kg/m2     Physical Exam Vitals and nursing note reviewed.  Constitutional:      General: She is not in acute distress.    Appearance: Normal appearance.  HENT:     Head: Normocephalic and atraumatic.  Eyes:     General:        Right eye: No discharge.        Left eye: No discharge.     Conjunctiva/sclera: Conjunctivae normal.  Cardiovascular:     Rate and Rhythm: Normal rate and regular rhythm.  Pulmonary:     Effort: Pulmonary effort is normal.     Breath sounds: Normal breath sounds. No wheezing, rhonchi  or rales.  Neurological:     Mental Status: She is alert.  Psychiatric:        Mood and Affect: Mood normal.        Behavior: Behavior normal.     Lab Results  Component Value Date   WBC 9.7 05/29/2022   HGB 13.7 05/29/2022   HCT 40.0 05/29/2022   PLT 194 05/29/2022   GLUCOSE 73 05/29/2022   CHOL 172 01/08/2021   TRIG 122 01/08/2021   HDL 47 01/08/2021   LDLCALC 103 (H) 01/08/2021   ALT 10 05/29/2022   AST 11 05/29/2022   NA 143 05/29/2022   K 4.1 05/29/2022   CL 109 05/29/2022   CREATININE 0.87 05/29/2022   BUN 13 05/29/2022   CO2 26 05/29/2022   TSH 0.702 09/12/2017     Assessment & Plan:   Problem List Items Addressed This Visit       Cardiovascular and Mediastinum   Essential hypertension    Uncontrolled.  Increasing amlodipine.      Relevant Medications   amLODipine (NORVASC) 10 MG tablet     Respiratory   COPD (chronic obstructive pulmonary disease) (HCC) - Primary    Stable on Trelegy.   Continue.  Continued to advise smoking cessation.  I have offered pharmacotherapy.       Meds ordered this encounter  Medications   amLODipine (NORVASC) 10 MG tablet    Sig: Take 1 tablet (10 mg total) by mouth daily.    Dispense:  90 tablet    Refill:  3    Follow-up:  Return in about 6 months (around 05/13/2023).  Everlene Other DO [facility]

## 2022-11-11 NOTE — Addendum Note (Signed)
Addended by: Henriette Combs on: 11/11/2022 08:54 AM   Modules accepted: Orders

## 2022-11-11 NOTE — Telephone Encounter (Signed)
Pt called requesting lab work be sent over to [facility] on [address]

## 2022-11-11 NOTE — Patient Instructions (Signed)
Amlodipine increased.  Continue working on smoking cessation.  Follow up in 6 months.

## 2022-11-11 NOTE — Assessment & Plan Note (Signed)
Stable on Trelegy.  Continue.  Continued to advise smoking cessation.  I have offered pharmacotherapy.

## 2022-11-11 NOTE — Assessment & Plan Note (Signed)
Uncontrolled.  Increasing amlodipine. 

## 2022-11-11 NOTE — Telephone Encounter (Signed)
Lab Orders released.  

## 2022-11-13 NOTE — Progress Notes (Signed)
Creatinine is elevated-1.06 and GFR is borderline low-59. Avoid the use of NSAIDs.  Alk phos is borderline elevated-we will continue to monitor.  LFTs WNL.  Absolute eosinophils are elevated. Rest of CBC WNL.  We will continue to monitor

## 2022-11-17 LAB — CBC WITH DIFFERENTIAL/PLATELET
Basophils Absolute: 0.1 10*3/uL (ref 0.0–0.2)
Basos: 1 %
EOS (ABSOLUTE): 0.6 10*3/uL — ABNORMAL HIGH (ref 0.0–0.4)
Eos: 7 %
Hematocrit: 39.8 % (ref 34.0–46.6)
Hemoglobin: 13.3 g/dL (ref 11.1–15.9)
Immature Grans (Abs): 0 10*3/uL (ref 0.0–0.1)
Immature Granulocytes: 0 %
Lymphocytes Absolute: 3.1 10*3/uL (ref 0.7–3.1)
Lymphs: 37 %
MCH: 30.9 pg (ref 26.6–33.0)
MCHC: 33.4 g/dL (ref 31.5–35.7)
MCV: 93 fL (ref 79–97)
Monocytes Absolute: 0.6 10*3/uL (ref 0.1–0.9)
Monocytes: 7 %
Neutrophils Absolute: 3.9 10*3/uL (ref 1.4–7.0)
Neutrophils: 48 %
Platelets: 166 10*3/uL (ref 150–450)
RBC: 4.3 x10E6/uL (ref 3.77–5.28)
RDW: 12.2 % (ref 11.7–15.4)
WBC: 8.2 10*3/uL (ref 3.4–10.8)

## 2022-11-17 LAB — CMP14+EGFR
ALT: 12 IU/L (ref 0–32)
AST: 14 IU/L (ref 0–40)
Albumin: 4.2 g/dL (ref 3.9–4.9)
Alkaline Phosphatase: 135 IU/L — ABNORMAL HIGH (ref 44–121)
BUN/Creatinine Ratio: 15 (ref 12–28)
BUN: 16 mg/dL (ref 8–27)
Bilirubin Total: 0.4 mg/dL (ref 0.0–1.2)
CO2: 21 mmol/L (ref 20–29)
Calcium: 9.2 mg/dL (ref 8.7–10.3)
Chloride: 106 mmol/L (ref 96–106)
Creatinine, Ser: 1.06 mg/dL — ABNORMAL HIGH (ref 0.57–1.00)
Globulin, Total: 2.3 g/dL (ref 1.5–4.5)
Glucose: 92 mg/dL (ref 70–99)
Potassium: 4.3 mmol/L (ref 3.5–5.2)
Sodium: 141 mmol/L (ref 134–144)
Total Protein: 6.5 g/dL (ref 6.0–8.5)
eGFR: 59 mL/min/{1.73_m2} — ABNORMAL LOW (ref >59)

## 2022-11-17 LAB — QUANTIFERON-TB GOLD PLUS
QuantiFERON Mitogen Value: >10 IU/mL
QuantiFERON Nil Value: 0.28 IU/mL
QuantiFERON TB1 Ag Value: 0.25 IU/mL
QuantiFERON TB2 Ag Value: 0.27 IU/mL
QuantiFERON-TB Gold Plus: NEGATIVE

## 2022-11-17 NOTE — Progress Notes (Signed)
TB gold negative

## 2022-11-19 ENCOUNTER — Encounter: Payer: Self-pay | Admitting: Rheumatology

## 2022-11-19 ENCOUNTER — Ambulatory Visit: Payer: BC Managed Care – PPO | Attending: Rheumatology | Admitting: Rheumatology

## 2022-11-19 ENCOUNTER — Telehealth: Payer: Self-pay | Admitting: Pharmacist

## 2022-11-19 VITALS — BP 152/78 | HR 68 | Resp 14 | Ht 66.5 in | Wt 157.2 lb

## 2022-11-19 DIAGNOSIS — E559 Vitamin D deficiency, unspecified: Secondary | ICD-10-CM

## 2022-11-19 DIAGNOSIS — M810 Age-related osteoporosis without current pathological fracture: Secondary | ICD-10-CM

## 2022-11-19 DIAGNOSIS — M19041 Primary osteoarthritis, right hand: Secondary | ICD-10-CM

## 2022-11-19 DIAGNOSIS — M0579 Rheumatoid arthritis with rheumatoid factor of multiple sites without organ or systems involvement: Secondary | ICD-10-CM

## 2022-11-19 DIAGNOSIS — Z79899 Other long term (current) drug therapy: Secondary | ICD-10-CM | POA: Diagnosis not present

## 2022-11-19 DIAGNOSIS — Z85828 Personal history of other malignant neoplasm of skin: Secondary | ICD-10-CM

## 2022-11-19 DIAGNOSIS — F17200 Nicotine dependence, unspecified, uncomplicated: Secondary | ICD-10-CM

## 2022-11-19 DIAGNOSIS — N814 Uterovaginal prolapse, unspecified: Secondary | ICD-10-CM

## 2022-11-19 DIAGNOSIS — M19071 Primary osteoarthritis, right ankle and foot: Secondary | ICD-10-CM | POA: Diagnosis not present

## 2022-11-19 DIAGNOSIS — M19042 Primary osteoarthritis, left hand: Secondary | ICD-10-CM

## 2022-11-19 DIAGNOSIS — M19072 Primary osteoarthritis, left ankle and foot: Secondary | ICD-10-CM

## 2022-11-19 DIAGNOSIS — Z8709 Personal history of other diseases of the respiratory system: Secondary | ICD-10-CM

## 2022-11-19 DIAGNOSIS — I10 Essential (primary) hypertension: Secondary | ICD-10-CM

## 2022-11-19 NOTE — Patient Instructions (Signed)
Standing Labs We placed an order today for your standing lab work.   Please have your standing labs drawn in September and every 3 months  Please have your labs drawn 2 weeks prior to your appointment so that the provider can discuss your lab results at your appointment, if possible.  Please note that you may see your imaging and lab results in MyChart before we have reviewed them. We will contact you once all results are reviewed. Please allow our office up to 72 hours to thoroughly review all of the results before contacting the office for clarification of your results.  WALK-IN LAB HOURS  Monday through Thursday from 8:00 am -12:30 pm and 1:00 pm-5:00 pm and Friday from 8:00 am-12:00 pm.  Patients with office visits requiring labs will be seen before walk-in labs.  You may encounter longer than normal wait times. Please allow additional time. Wait times may be shorter on  Monday and Thursday afternoons.  We do not book appointments for walk-in labs. We appreciate your patience and understanding with our staff.   Labs are drawn by [facility]. Please bring your co-pay at the time of your lab draw.  You may receive a bill from [facility] for your lab work.  Please note if you are on Hydroxychloroquine and and an order has been placed for a Hydroxychloroquine level,  you will need to have it drawn 4 hours or more after your last dose.  If you wish to have your labs drawn at another location, please call the office 24 hours in advance so we can fax the orders.  The office is located at [address], [address], [place], [place] [address]   If you have any questions regarding directions or hours of operation,  please call [phone].   As a reminder, please drink plenty of water prior to coming for your lab work. Thanks!   Vaccines You are taking a medication(s) that can suppress your immune system.  The following immunizations are recommended: Flu annually Covid-19  RSV Td/Tdap (tetanus,  diphtheria, pertussis) every 10 years Pneumonia (Prevnar 15 then Pneumovax 23 at least 1 year apart.  Alternatively, can take Prevnar 20 without needing additional dose) Shingrix: 2 doses from 4 weeks to 6 months apart  Please check with your PCP to make sure you are up to date.   If you have signs or symptoms of an infection or start antibiotics: First, call your PCP for workup of your infection. Hold your medication through the infection, until you complete your antibiotics, and until symptoms resolve if you take the following: Injectable medication (Actemra, Benlysta, Cimzia, Cosentyx, Enbrel, Humira, Kevzara, Orencia, Remicade, Simponi, Stelara, Taltz, Tremfya) Methotrexate Leflunomide (Arava) Mycophenolate (Cellcept) Harriette Ohara, Olumiant, or Rinvoq  Please get an annual skin examination to screen for skin cancer while you are on Humira.  Please use sunscreen and sun protection.

## 2022-11-19 NOTE — Telephone Encounter (Signed)
Patient will have [facility] Medicare plan become active on 11/24/2022. PAP application completed by patient at [facility]. Provider portion placed in Dr. Fatima Sanger folder for signature  Patient plans to upload copy of insurance cards via MyChart.  Please start HUMIRA BIV on or after 11/24/2022  Chesley Mires, PharmD, MPH, BCPS, CPP Clinical Pharmacist (Rheumatology and Pulmonology)

## 2022-11-20 NOTE — Telephone Encounter (Signed)
Signed provider form received for [employer] application for Humira. PAP faxed into Onbase for submission when PA is approved after 11/24/22  Chesley Mires, PharmD, MPH, BCPS, CPP Clinical Pharmacist (Rheumatology and Pulmonology)

## 2022-11-24 ENCOUNTER — Encounter: Payer: Self-pay | Admitting: Rheumatology

## 2022-11-25 ENCOUNTER — Other Ambulatory Visit (HOSPITAL_COMMUNITY): Payer: Self-pay

## 2022-11-25 NOTE — Telephone Encounter (Signed)
Submitted a Prior Authorization request to [facility] for HUMIRA via CoverMyMeds. Will update once we receive a response.  Key: [id number] - PA Case ID: [id number]

## 2022-11-25 NOTE — Telephone Encounter (Signed)
Faxed completed Humira patient assistance form, insurance card, and PA approval to [facility] [employer] program.  Phone# [phone] Fax# [phone]

## 2022-11-25 NOTE — Telephone Encounter (Signed)
Received notification from [facility] regarding a prior authorization for HUMIRA. Authorization has been APPROVED from 05/26/22 to 05/26/23.   Per test claim, copay for 28 days supply is $100.00  Authorization # (Key: [id number]) PA Case ID #: [id number]

## 2022-12-02 MED ORDER — HUMIRA (2 PEN) 40 MG/0.4ML ~~LOC~~ AJKT: 40.0000 mg | AUTO-INJECTOR | SUBCUTANEOUS | 0 refills | Status: DC

## 2022-12-02 NOTE — Telephone Encounter (Signed)
Received a fax from  [employer] regarding an approval for HUMIRA patient assistance from 12/01/2022 to 05/25/2024. Approval letter sent to scan center.  Phone: [phone]  Called patient and provided her with phone number for company. Advised her to call [employer] by next Monday if she has not receive outreach from them. Nothing further should be needed.  Chesley Mires, PharmD, MPH, BCPS, CPP Clinical Pharmacist (Rheumatology and Pulmonology)

## 2023-01-29 ENCOUNTER — Ambulatory Visit: Payer: Medicare PPO | Admitting: Podiatry

## 2023-01-29 DIAGNOSIS — Q828 Other specified congenital malformations of skin: Secondary | ICD-10-CM

## 2023-01-31 NOTE — Progress Notes (Signed)
Subjective: Chief Complaint  Patient presents with   Callouses    Callus trim to bilateral feet.     67 year old female presents the office with concern of bilateral foot pain, callus formation.  No open lesions or swelling, redness/signs of infection. No new concerns.   Objective: AAO x3, NAD DP/PT pulses palpable bilaterally, CRT less than 3 seconds Hyperkeratotic lesions noted submetatarsal 3 and medial 2nd toe on the right foot as well as left plantar hallux.  There is callus formation present submetatarsal 5 on the left foot.  There is no underlying ulceration drainage or any signs of infection noted today.  Prominent metatarsal head.  Bunion noted.   No pain with calf compression, swelling, warmth, erythema  Assessment: Hyperkeratotic lesions to digital deformity, prominent metatarsal heads  Plan: -All treatment options discussed with the patient including all alternatives, risks, complications.  -Sharply debrided the hyperkeratotic lesions with any complications x 4.  The bleeding occurred on the right second toe.  Antibiotic ointment dressing applied.  Monitoring signs or symptoms of infection.  Continue moisturizer and offloading daily.  Discussed offloading pads and shoe modifications.  Monitoring skin breakdown or signs of infection. -Patient encouraged to call the office with any questions, concerns, change in symptoms.   Vivi Barrack DPM

## 2023-02-11 ENCOUNTER — Other Ambulatory Visit: Payer: Self-pay | Admitting: Family Medicine

## 2023-02-11 DIAGNOSIS — Z720 Tobacco use: Secondary | ICD-10-CM

## 2023-02-23 ENCOUNTER — Other Ambulatory Visit: Payer: Self-pay | Admitting: *Deleted

## 2023-02-23 DIAGNOSIS — Z79899 Other long term (current) drug therapy: Secondary | ICD-10-CM

## 2023-02-23 DIAGNOSIS — M0579 Rheumatoid arthritis with rheumatoid factor of multiple sites without organ or systems involvement: Secondary | ICD-10-CM

## 2023-02-23 MED ORDER — HUMIRA (2 PEN) 40 MG/0.4ML ~~LOC~~ AJKT: 40.0000 mg | AUTO-INJECTOR | SUBCUTANEOUS | 0 refills | Status: DC

## 2023-02-23 NOTE — Telephone Encounter (Signed)
Refill request received via fax from [employer] for Humira  Last Fill: 12/02/2022  Labs: 11/11/2022 Creatinine is elevated-1.06 and GFR is borderline low-59. Alk phos is borderline elevated LFTs WNL. Absolute eosinophils are elevated. Rest of CBC WNL.   TB Gold: 11/11/2022   Next Visit: 04/14/2023  Last Visit: 11/19/2022  DX: Rheumatoid arthritis involving multiple sites with positive rheumatoid factor   Current Dose per office note 12/02/2022: Hyrimoz 40 mg sq injections every 14 days.   Attempted to contact the patient to advise she is due to update labs. No answer on phone and unable to leave a message.   Okay to refill Humira?

## 2023-03-31 NOTE — Progress Notes (Signed)
Office Visit Note  Patient: Dawn Meyer             Date of Birth: 14-Apr-1956           MRN: [id number]             PCP: Tommie Sams, DO Referring: Tommie Sams, DO Visit Date: 04/14/2023 Occupation: @GUAROCC @  Subjective:  Upcoming moh's surgery   History of Present Illness: Dawn Meyer is a 67 y.o. female with history of seropositive rheumatoid arthritis, osteoarthritis, and osteoporosis.  Patient remains on Humira 40 mg sq injections every 14 days.  Patient states that she is 1 week overdue for her Humira injection so she was brought the device and to have administered today since she is unable to self administer.  The individual that normally performs her injections has been out of town.  Patient states that she has been injecting Humira every 2 to 3 weeks.  She denies any signs or symptoms of a rheumatoid arthritis flare.  She denies any recent or recurrent infections.  Of note the patient will be having upcoming Mohs surgery for lesion on her right cheek.  According to the patient there is a nodule at the site that will require surgical intervention by ENT as well.  She will notify us of the surgical dates once approved.  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    Activities of Daily Living:  Patient reports morning stiffness for 0 minute.   Patient Denies nocturnal pain.  Difficulty dressing/grooming: Denies Difficulty climbing stairs: Denies Difficulty getting out of chair: Denies Difficulty using hands for taps, buttons, cutlery, and/or writing: Denies  Review of Systems  Constitutional:  Negative for fatigue.  HENT:  Negative for mouth sores and mouth dryness.   Eyes:  Negative for dryness.  Respiratory:  Negative for shortness of breath.   Cardiovascular:  Negative for chest pain and palpitations.  Gastrointestinal:  Negative for blood in stool, constipation and  diarrhea.  Endocrine: Negative for increased urination.  Genitourinary:  Negative for involuntary urination.  Musculoskeletal:  Positive for joint swelling. Negative for joint pain, gait problem, joint pain, myalgias, muscle weakness, morning stiffness, muscle tenderness and myalgias.  Skin:  Negative for color change, rash, hair loss and sensitivity to sunlight.  Allergic/Immunologic: Negative for susceptible to infections.  Neurological:  Negative for dizziness and headaches.  Hematological:  Negative for swollen glands.  Psychiatric/Behavioral:  Negative for depressed mood and sleep disturbance. The patient is not nervous/anxious.     PMFS History:  Patient Active Problem List   Diagnosis Date Noted   COPD (chronic obstructive pulmonary disease) (HCC) 07/02/2022   Essential hypertension 10/10/2021   Asthma    Allergic rhinitis 11/17/2017   Tobacco abuse 11/17/2017   Rheumatoid arthritis involving multiple sites with positive rheumatoid factor (HCC) 08/18/2016   OP (osteoporosis) 08/18/2016    Past Medical History:  Diagnosis Date   Asthma    Prolapsed uterus    per patient, dx by OB-GYN   RA (rheumatoid arthritis) (HCC)     Family History  Problem Relation Age of Onset   Congestive Heart Failure Mother    Heart attack Mother    Diabetes Father    Heart Problems Father        pacemaker   Diverticulitis Sister    Hypertension Sister    Heart attack Brother  Healthy Daughter    Colon cancer Neg Hx    Past Surgical History:  Procedure Laterality Date   COLONOSCOPY WITH PROPOFOL N/A 08/09/2021   Procedure: COLONOSCOPY WITH PROPOFOL;  Surgeon: Corbin Ade, MD;  Location: [facility];  Service: Endoscopy;  Laterality: N/A;  7:30am   [place] procedure  04/28/2022   for prolapsed cervix   MOHS SURGERY     SQUAMOUS CELL CARCINOMA EXCISION     VAGINAL DELIVERY     Social History   Social History Narrative   Not on file   Immunization History  Administered  Date(s) Administered   Fluad Trivalent(High Dose 65+) 03/09/2023   Influenza Inj Mdck Quad Pf 03/12/2017   Influenza,inj,Quad PF,6+ Mos 02/14/2019   Influenza-Unspecified 03/20/2016, 03/11/2018, 03/13/2020, 03/16/2021, 03/19/2022   Janssen (J&J) SARS-COV-2 Vaccination 08/31/2019   Td 08/28/2009     Objective: Vital Signs: BP 123/77 (BP Location: Left Arm, Patient Position: Sitting, Cuff Size: Normal)   Pulse 73   Resp 14   Ht 5' 6.5" (1.689 m)   Wt 156 lb 6.4 oz (70.9 kg)   BMI 24.87 kg/m    Physical Exam Vitals and nursing note reviewed.  Constitutional:      Appearance: She is well-developed.  HENT:     Head: Normocephalic and atraumatic.  Eyes:     Conjunctiva/sclera: Conjunctivae normal.  Cardiovascular:     Rate and Rhythm: Normal rate and regular rhythm.     Heart sounds: Normal heart sounds.  Pulmonary:     Effort: Pulmonary effort is normal.     Breath sounds: Normal breath sounds.  Abdominal:     General: Bowel sounds are normal.     Palpations: Abdomen is soft.  Musculoskeletal:     Cervical back: Normal range of motion.  Lymphadenopathy:     Cervical: No cervical adenopathy.  Skin:    General: Skin is warm and dry.     Capillary Refill: Capillary refill takes less than 2 seconds.  Neurological:     Mental Status: She is alert and oriented to person, place, and time.  Psychiatric:        Behavior: Behavior normal.      Musculoskeletal Exam: C-spine, thoracic spine, lumbar spine have good range of motion.  Shoulder joints and elbow joints have good range of motion.  Small rheumatoid nodule on the extensor surface of the right elbow.  Wrist joints have good range of motion with no tenderness or synovitis.  Thickening of the right second and third MCP joints.  PIP and DIP thickening consistent with osteoarthritis of both hands.  Complete fist formation noted bilaterally.  Hip joints have good range of motion with no groin pain.  Knee joints have good range of  motion with no warmth or effusion.  Ankle joints have good range of motion with no synovitis.  CDAI Exam: CDAI Score: -- Patient Global: --; Provider Global: -- Swollen: --; Tender: -- Joint Exam 04/14/2023   No joint exam has been documented for this visit   There is currently no information documented on the homunculus. Go to the Rheumatology activity and complete the homunculus joint exam.  Investigation: No additional findings.  Imaging: No results found.  Recent Labs: Lab Results  Component Value Date   WBC 8.0 04/10/2023   HGB 13.4 04/10/2023   PLT 179 04/10/2023   NA 142 04/10/2023   K 3.9 04/10/2023   CL 107 (H) 04/10/2023   CO2 21 04/10/2023   GLUCOSE 99 04/10/2023  BUN 10 04/10/2023   CREATININE 0.98 04/10/2023   BILITOT 0.4 04/10/2023   ALKPHOS 122 (H) 04/10/2023   AST 16 04/10/2023   ALT 11 04/10/2023   PROT 6.4 04/10/2023   ALBUMIN 4.0 04/10/2023   CALCIUM 9.3 04/10/2023   GFRAA 78 03/13/2020   QFTBGOLD Negative 07/29/2016   QFTBGOLDPLUS Negative 11/11/2022    Speciality Comments: Osteoporosis manged by Dr. Corliss Skains.  She declines treatment-ACY 01/11/2019  Procedures:  No procedures performed Allergies: Doxycycline   Assessment / Plan:     Visit Diagnoses: Rheumatoid arthritis involving multiple sites with positive rheumatoid factor (HCC) - She has no synovitis on examination today.  She remains on Humira 40 mg sq injections every 2-3 weeks. She is one week overdue for humira, so the injection was administered today while in the office.  She continues to tolerate humira without any side effects or injection site reactions. She will be scheduling upcoming moh's surgery for a basal cell carcinoma on the right side of her cheek.  Patient has previously undergone moh's surgery for a squamous cell carcinoma.  Discussed the risk of skin cancer while on a TNFi--discussed risks and benefits of continuing humira.  Discussed treatment alternative-Orencia but the  patient has mild COPD which has been well controlled with the use of trelegy. Discussed with both Dr. Corliss Skains and Chesley Mires, [facility] today in the office. Plan to proceed with applying for orencia--ok initiate orencia in the office once cleared by dermatology/ENT during the postoperative period.  She will follow up in 3 months or sooner if needed.    Plan: adalimumab (HUMIRA) pen 40 mg  Medication counseling:  TB Gold: negative 11/11/22  Does patient have a diagnosis of COPD? Mild--well controlled-currently using trelegy-discussed with Dr. Corliss Skains and Chesley Mires, [facility].  Counseled patient that Dub Amis is a selective T-cell costimulation blocker indicated for rheumatoid arthritis.  Counseled patient on purpose, proper use, and adverse effects of Orencia. The most common adverse effects are increased risk of infections, headache, and injection site reactions.  There is the possibility of an increased risk of malignancy but it is not well understood if this increased risk is due to the medication or the disease state.  Reviewed the importance of regular labs while on Orencia therapy.  Counseled patient that Dub Amis should be held prior to scheduled surgery.  Counseled patient to avoid live vaccines while on Orencia.  Advised patient to get annual influenza vaccine and the pneumococcal vaccine as indicated.  Provided patient with medication education material and answered all questions.  Patient consented to [facility].  Will upload consent into patient's chart.  Will apply for Orencia through patient's insurance.  Reviewed storage information for Orencia.  Advised initial injection must be administered in office.    High risk medication use -She administered her dose of humira today in the office. She has been injecting Humira every 2-3 weeks. Plan to discontinue Humira due to skin cancer x2.  Plan to initiate Orencia after surgical clearance is obtained. Dub Amis will be initiated while in the office once  cleared and approved by insurance.  She will require updated CBC and CMP 1 month after switching to [facility] and then every 3 months for medication monitoring.  CBC and CMP updated on 11/11/22. Orders for CBC and CMP released today.  TB gold negative on 11/11/22.  No recent or recurrent infections. Discussed the importance of holding hyrimoz if she develops signs or symptoms of an infection and to resume once the infection has completely cleared.  -  Plan: adalimumab (HUMIRA) pen 40 mg  Primary osteoarthritis of both hands: PIP and DIP thickening.  No synovitis noted.    Primary osteoarthritis of both feet: Ankle joints have good ROM with no synovitis. She is wearing proper fitting shoes.   History of skin cancer - Under close monitoring of dermatology.  Previous Moh's surgery for squamous cell carcinoma.  Lesion on right side of cheek--will be undergoing upcoming moh's surgery for basal cell carcinoma per patient. She will also require surgical intervention by ENT per patient. We will try to obtain dermatology records.  Discussed treatment alternatives to TNFi's given history of skin cancer.   Discussed proceeding with Orencia once surgery is complete.  History of COPD: CXR 1 view 06/26/22: Mild bronchial wall thickening with hyperexpanded lungs and prominent interstitial markings, suggestive of COPD.  Currently on trelegy-well-controlled.   Current smoker.  Other medical conditions are listed as follows:   Age-related osteoporosis without current pathological fracture - DEXA 01/19/14 left forearm at the 33% radius site, BMD 0.588 T-score -3.3.  Patient does not want to have a DEXA scan.  Vitamin D deficiency  Essential hypertension: BP was 123/77 today in the office.   History of asthma  Smoker  Orders: No orders of the defined types were placed in this encounter.  Meds ordered this encounter  Medications   adalimumab (HUMIRA) pen 40 mg   Follow-Up Instructions: Return in about 3  months (around 07/15/2023) for Rheumatoid arthritis, Osteoporosis.   Gearldine Bienenstock, PA-C  Note - This record has been created using Dragon software.  Chart creation errors have been sought, but may not always  have been located. Such creation errors do not reflect on  the standard of medical care.

## 2023-04-08 ENCOUNTER — Other Ambulatory Visit: Payer: Self-pay | Admitting: *Deleted

## 2023-04-08 ENCOUNTER — Telehealth: Payer: Self-pay | Admitting: *Deleted

## 2023-04-08 DIAGNOSIS — Z79899 Other long term (current) drug therapy: Secondary | ICD-10-CM

## 2023-04-08 NOTE — Telephone Encounter (Signed)
Patient contacted the office requesting her lab orders to be released to [facility]. Lab orders released as requested.   Patient advised her lab orders have been released.

## 2023-04-11 LAB — CBC WITH DIFFERENTIAL/PLATELET
Basophils Absolute: 0.1 10*3/uL (ref 0.0–0.2)
Basos: 1 %
EOS (ABSOLUTE): 0.8 10*3/uL — ABNORMAL HIGH (ref 0.0–0.4)
Eos: 10 %
Hematocrit: 40.3 % (ref 34.0–46.6)
Hemoglobin: 13.4 g/dL (ref 11.1–15.9)
Immature Grans (Abs): 0 10*3/uL (ref 0.0–0.1)
Immature Granulocytes: 0 %
Lymphocytes Absolute: 2.6 10*3/uL (ref 0.7–3.1)
Lymphs: 33 %
MCH: 31 pg (ref 26.6–33.0)
MCHC: 33.3 g/dL (ref 31.5–35.7)
MCV: 93 fL (ref 79–97)
Monocytes Absolute: 0.6 10*3/uL (ref 0.1–0.9)
Monocytes: 8 %
Neutrophils Absolute: 3.9 10*3/uL (ref 1.4–7.0)
Neutrophils: 48 %
Platelets: 179 10*3/uL (ref 150–450)
RBC: 4.32 x10E6/uL (ref 3.77–5.28)
RDW: 11.8 % (ref 11.7–15.4)
WBC: 8 10*3/uL (ref 3.4–10.8)

## 2023-04-11 LAB — CMP14+EGFR
ALT: 11 [IU]/L (ref 0–32)
AST: 16 [IU]/L (ref 0–40)
Albumin: 4 g/dL (ref 3.9–4.9)
Alkaline Phosphatase: 122 [IU]/L — ABNORMAL HIGH (ref 44–121)
BUN/Creatinine Ratio: 10 — ABNORMAL LOW (ref 12–28)
BUN: 10 mg/dL (ref 8–27)
Bilirubin Total: 0.4 mg/dL (ref 0.0–1.2)
CO2: 21 mmol/L (ref 20–29)
Calcium: 9.3 mg/dL (ref 8.7–10.3)
Chloride: 107 mmol/L — ABNORMAL HIGH (ref 96–106)
Creatinine, Ser: 0.98 mg/dL (ref 0.57–1.00)
Globulin, Total: 2.4 g/dL (ref 1.5–4.5)
Glucose: 99 mg/dL (ref 70–99)
Potassium: 3.9 mmol/L (ref 3.5–5.2)
Sodium: 142 mmol/L (ref 134–144)
Total Protein: 6.4 g/dL (ref 6.0–8.5)
eGFR: 64 mL/min/{1.73_m2} (ref >59)

## 2023-04-13 NOTE — Progress Notes (Signed)
CMP stable. Alk phos remains borderline elevated but has trended down.   Absolute eosinophils are elevated. Rest of CBC Wnl.  We will continue to monitor.

## 2023-04-14 ENCOUNTER — Encounter: Payer: Self-pay | Admitting: Physician Assistant

## 2023-04-14 ENCOUNTER — Telehealth: Payer: Self-pay | Admitting: Pharmacist

## 2023-04-14 ENCOUNTER — Ambulatory Visit: Payer: Medicare PPO | Attending: Physician Assistant | Admitting: Physician Assistant

## 2023-04-14 VITALS — BP 123/77 | HR 73 | Resp 14 | Ht 66.5 in | Wt 156.4 lb

## 2023-04-14 DIAGNOSIS — Z79899 Other long term (current) drug therapy: Secondary | ICD-10-CM | POA: Diagnosis not present

## 2023-04-14 DIAGNOSIS — M19041 Primary osteoarthritis, right hand: Secondary | ICD-10-CM | POA: Diagnosis not present

## 2023-04-14 DIAGNOSIS — M0579 Rheumatoid arthritis with rheumatoid factor of multiple sites without organ or systems involvement: Secondary | ICD-10-CM | POA: Diagnosis not present

## 2023-04-14 DIAGNOSIS — M19071 Primary osteoarthritis, right ankle and foot: Secondary | ICD-10-CM | POA: Diagnosis not present

## 2023-04-14 DIAGNOSIS — I10 Essential (primary) hypertension: Secondary | ICD-10-CM

## 2023-04-14 DIAGNOSIS — Z85828 Personal history of other malignant neoplasm of skin: Secondary | ICD-10-CM

## 2023-04-14 DIAGNOSIS — F17200 Nicotine dependence, unspecified, uncomplicated: Secondary | ICD-10-CM

## 2023-04-14 DIAGNOSIS — E559 Vitamin D deficiency, unspecified: Secondary | ICD-10-CM

## 2023-04-14 DIAGNOSIS — M810 Age-related osteoporosis without current pathological fracture: Secondary | ICD-10-CM

## 2023-04-14 DIAGNOSIS — M19042 Primary osteoarthritis, left hand: Secondary | ICD-10-CM

## 2023-04-14 DIAGNOSIS — Z8709 Personal history of other diseases of the respiratory system: Secondary | ICD-10-CM

## 2023-04-14 DIAGNOSIS — M19072 Primary osteoarthritis, left ankle and foot: Secondary | ICD-10-CM

## 2023-04-14 MED ORDER — ADALIMUMAB 40 MG/0.4ML ~~LOC~~ AJKT
40.0000 mg | AUTO-INJECTOR | Freq: Once | SUBCUTANEOUS | Status: AC
  Administered 2023-04-14: 40 mg via SUBCUTANEOUS

## 2023-04-14 NOTE — Progress Notes (Signed)
Pharmacy Note  Subjective: Patient presents today to [facility] for follow up office visit.   Patient seen by the pharmacist for counseling on subcutaneous Orencia for rheumatoid arthritis. She is currently taking Humira. Has Moh's surgery and nerve resizing with ENT that needs to be scheduled but is still pending - she is trying to ensure that both surgeries can be scheduled for the same day  Objective: CBC    Component Value Date/Time   WBC 8.0 04/10/2023 0949   WBC 9.7 05/29/2022 1012   RBC 4.32 04/10/2023 0949   RBC 4.28 05/29/2022 1012   HGB 13.4 04/10/2023 0949   HCT 40.3 04/10/2023 0949   PLT 179 04/10/2023 0949   MCV 93 04/10/2023 0949   MCH 31.0 04/10/2023 0949   MCH 32.0 05/29/2022 1012   MCHC 33.3 04/10/2023 0949   MCHC 34.3 05/29/2022 1012   RDW 11.8 04/10/2023 0949   LYMPHSABS 2.6 04/10/2023 0949   MONOABS 0.5 09/09/2017 1603   EOSABS 0.8 (H) 04/10/2023 0949   BASOSABS 0.1 04/10/2023 0949    CMP     Component Value Date/Time   NA 142 04/10/2023 0949   K 3.9 04/10/2023 0949   CL 107 (H) 04/10/2023 0949   CO2 21 04/10/2023 0949   GLUCOSE 99 04/10/2023 0949   GLUCOSE 73 05/29/2022 1012   BUN 10 04/10/2023 0949   CREATININE 0.98 04/10/2023 0949   CREATININE 0.87 05/29/2022 1012   CALCIUM 9.3 04/10/2023 0949   PROT 6.4 04/10/2023 0949   ALBUMIN 4.0 04/10/2023 0949   AST 16 04/10/2023 0949   ALT 11 04/10/2023 0949   ALKPHOS 122 (H) 04/10/2023 0949   BILITOT 0.4 04/10/2023 0949   GFRNONAA 68 03/13/2020 1057   GFRNONAA 63 02/17/2018 1129   GFRAA 78 03/13/2020 1057   GFRAA 73 02/17/2018 1129    Baseline Immunosuppressant Therapy Labs TB GOLD    Latest Ref Rng & Units 11/11/2022   11:05 AM  Quantiferon TB Gold  Quantiferon TB Gold Plus Negative Negative    Hepatitis Panel   HIV No results found for: "HIV" Immunoglobulins   SPEP    Latest Ref Rng & Units 04/10/2023    9:49 AM  Serum Protein Electrophoresis  Total Protein 6.0 - 8.5  g/dL 6.4    Chest x-ray: 09/27/979 - 1. No focal airspace opacity. 2. Mild bronchial wall thickening with hyperexpanded lungs and prominent interstitial markings, suggestive of COPD. 3.  Aortic Atherosclerosis (ICD10-I70.0).  Does patient have a diagnosis of COPD? Yes  Does patient have history of diverticulitis?  No  Assessment/Plan:  Counseled patient that Dub Amis is a selective T-cell costimulation blocker.  Counseled patient on purpose, proper use, and adverse effects of Orencia. The most common adverse effects are increased risk of infections, headache, and injection site reactions.  There is the possibility of an increased risk of malignancy but it is not well understood if this increased risk is due to the medication or the disease state. Reviewed risk of GI perforation which is higher in patients with diverticulitis and diabetes.  Counseled patient that Dub Amis should be held prior to scheduled surgery.  Counseled patient to avoid live vaccines while on Orencia.  Recommend annual influenza, Pneumovax 23, Prevnar 13, and Shingrix as indicated.   Reviewed the importance of regular labs while on Orencia therapy. Patient will be due for labs 1 month after starting therapy. Standing orders placed. Provided patient with medication education material and answered all questions.  Patient consented to [facility].  Will upload consent into patient's chart.  Will apply for Orencia through patient's insurance.  Reviewed storage information for Orencia.  Advised initial injection must be administered in office.    Patient dose will be Orencia 125 mg every 7 days.  Prescription pending lab results and/or insurance approval. She will need to have her surgeries completed prior to switching to Blenda Bridegroom, PharmD, MPH, BCPS, CPP Clinical Pharmacist (Rheumatology and Pulmonology)

## 2023-04-14 NOTE — Patient Instructions (Signed)
Abatacept Injection What is this medication? ABATACEPT (a ba TA sept) treats autoimmune conditions, such as arthritis. It may also be used to treat a condition that can occur after a stem cell or bone marrow transplant (chronic graft versus host disease). It works by slowing down an overactive immune system. This medicine may be used for other purposes; ask your health care provider or pharmacist if you have questions. COMMON BRAND NAME(S): Reatha Armour ClickJect What should I tell my care team before I take this medication? They need to know if you have any of these conditions: Cancer Diabetes Having surgery Hepatitis B or history of hepatitis B infection Immune system problems Infection, especially a viral infection, such as chickenpox, cold sores, herpes Lung or breathing problems, such as asthma or COPD Recent or upcoming vaccine Tuberculosis, a positive skin test for tuberculosis, or have recently been in close contact with someone who has tuberculosis An unusual or allergic reaction to abatacept, other medications, foods, dyes, or preservatives Pregnant or trying to get pregnant Breastfeeding How should I use this medication? This medication is infused into a vein or injected under the skin. Infusions are given by your care team in a hospital or clinic setting. It may be injected under the skin at home. If you get this medication at home, you will be taught how to prepare and give it. Use exactly as directed. Take it as directed on the prescription label. Keep taking it unless your care team tells you to stop. It is important that you put your used needles and syringes in a special sharps container. Do not put them in a trash can. If you do not have a sharps container, call your pharmacist or care team to get one. Talk to your care team about the use of this medication in children. While it may be prescribed for children as young as 2 years for selected conditions, precautions do  apply. Overdosage: If you think you have taken too much of this medicine contact a poison control center or emergency room at once. NOTE: This medicine is only for you. Do not share this medicine with others. What if I miss a dose? If you miss a dose, take it as soon as you can. If it is almost time for your next dose, take only that dose. Do not take double or extra doses. What may interact with this medication? Do not take this medication with any of the following: Live virus vaccines This medication may also interact with the following: Anakinra Baricitinib Canakinumab Medications that lower your chance of fighting an infection Rituximab TNF blockers, such as adalimumab, certolizumab, etanercept, golimumab, infliximab Tocilizumab Tofacitinib Upadacitinib Ustekinumab This list may not describe all possible interactions. Give your health care provider a list of all the medicines, herbs, non-prescription drugs, or dietary supplements you use. Also tell them if you smoke, drink alcohol, or use illegal drugs. Some items may interact with your medicine. What should I watch for while using this medication? Visit your care team for regular checks on your progress. Tell your care team if your symptoms do not start to get better or if they get worse. You will be tested for tuberculosis (TB) before you start this medication. If your care team prescribed any medication for TB, you should start taking the TB medication before starting this medication. Make sure to finish the full course of TB medication. This medication may increase your risk of getting an infection. Call your care team if you get fever,  chills, or sore throat, or other symptoms of a cold or flu. Do not treat yourself. Try to avoid being around people who are sick. If you have diabetes and are getting this medication as an infusion, it may give false high blood sugar readings on the day of your dose. This may happen if you use certain  types of blood glucose tests. Your care team may tell you to use a different way to monitor your blood sugar levels. What side effects may I notice from receiving this medication? Side effects that you should report to your care team as soon as possible: Allergic reactions--skin rash, itching, hives, swelling of the face, lips, tongue, or throat Infection--fever, chills, cough, sore throat, wounds that don't heal, pain or trouble when passing urine, general feeling of discomfort or being unwell Side effects that usually do not require medical attention (report to your care team if they continue or are bothersome): Back pain Cough Dizziness Headache Runny or stuffy nose Sore throat This list may not describe all possible side effects. Call your doctor for medical advice about side effects. You may report side effects to FDA at 1-800-FDA-1088. Where should I keep my medication? If you take this medication at home, keep out of the reach of children and pets. Store in the refrigerator. Keep this medication in the original container. Protect from light. Do not freeze. Do not shake. Get rid of any unused medication after the expiration date. To get rid of medications that are no longer needed or have expired: Take the medication to a medication take-back program. Check with your pharmacy or law enforcement to find a location. If you cannot return the medication, ask your pharmacist or care team how to get rid of the medication safely. NOTE: This sheet is a summary. It may not cover all possible information. If you have questions about this medicine, talk to your doctor, pharmacist, or health care provider.  2024 Elsevier/Gold Standard (2021-10-10 00:00:00)

## 2023-04-14 NOTE — Telephone Encounter (Addendum)
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 Provider portion will be completed once patient returns her portion of PAP  Chesley Mires, PharmD, MPH, BCPS, CPP Clinical Pharmacist (Rheumatology and Pulmonology)

## 2023-04-30 ENCOUNTER — Encounter: Payer: Self-pay | Admitting: Podiatry

## 2023-04-30 ENCOUNTER — Ambulatory Visit: Payer: Medicare PPO | Admitting: Podiatry

## 2023-04-30 DIAGNOSIS — Q828 Other specified congenital malformations of skin: Secondary | ICD-10-CM | POA: Diagnosis not present

## 2023-05-03 NOTE — Progress Notes (Signed)
Subjective: Chief Complaint  Patient presents with   Callouses    RM#12 Calluses on both feet need shaving.     67 year old female presents the office with concern of bilateral foot pain, callus formation.  This shows a cause pain again.  No open lesions or swelling, redness/signs of infection. No new concerns.   Objective: AAO x3, NAD DP/PT pulses palpable bilaterally, CRT less than 3 seconds Hyperkeratotic lesions noted submetatarsal 3 and medial 2nd toe on the right foot as well as left plantar hallux.  There is callus formation present submetatarsal 5 on the left foot.  There is no underlying ulceration drainage or any signs of infection noted today.  Prominent metatarsal head.  Bunion noted.   No pain with calf compression, swelling, warmth, erythema  Assessment: Hyperkeratotic lesions to digital deformity, prominent metatarsal heads  Plan: -All treatment options discussed with the patient including all alternatives, risks, complications.  -Sharply debrided the hyperkeratotic lesions with any complications x 4.  Continue moisturizer and offloading daily.  Discussed offloading pads and shoe modifications.  Monitoring skin breakdown or signs of infection. -Patient encouraged to call the office with any questions, concerns, change in symptoms.   Vivi Barrack DPM

## 2023-05-11 NOTE — Telephone Encounter (Signed)
Received notification from [facility] regarding a prior authorization for ORENCIA SQ. Authorization has been APPROVED from 05/11/23 to 05/25/2024. Approval letter sent to scan center.  Submitted Patient Assistance Application to [employer] for ORENCIA SQ along with provider portion, patient portion, PA, medication list, insurance card copy and OOP rx payment documents. Will update patient when we receive a response.  Phone #: [phone] Fax #: [phone]

## 2023-05-11 NOTE — Telephone Encounter (Signed)
Received signed provider and MD forms for [employer] PAP.  Submitted a Prior Authorization request to [facility] for ORENCIA SQ via CoverMyMeds. Will update once we receive a response.  Key: [id number]  Chesley Mires, PharmD, MPH, BCPS, CPP Clinical Pharmacist (Rheumatology and Pulmonology)

## 2023-05-13 ENCOUNTER — Ambulatory Visit: Payer: Medicare PPO | Admitting: Family Medicine

## 2023-05-13 VITALS — BP 136/70 | HR 72 | Temp 98.4°F | Ht 66.5 in | Wt 156.0 lb

## 2023-05-13 DIAGNOSIS — Z720 Tobacco use: Secondary | ICD-10-CM

## 2023-05-13 DIAGNOSIS — E785 Hyperlipidemia, unspecified: Secondary | ICD-10-CM

## 2023-05-13 DIAGNOSIS — I10 Essential (primary) hypertension: Secondary | ICD-10-CM

## 2023-05-13 DIAGNOSIS — J449 Chronic obstructive pulmonary disease, unspecified: Secondary | ICD-10-CM

## 2023-05-13 MED ORDER — TRELEGY ELLIPTA 100-62.5-25 MCG/ACT IN AEPB: 1.0000 | INHALATION_SPRAY | Freq: Every day | RESPIRATORY_TRACT | 11 refills | Status: DC

## 2023-05-13 MED ORDER — PREDNISONE 50 MG PO TABS: 50.0000 mg | ORAL_TABLET | Freq: Every day | ORAL | 0 refills | Status: AC

## 2023-05-13 MED ORDER — ALBUTEROL SULFATE HFA 108 (90 BASE) MCG/ACT IN AERS: INHALATION_SPRAY | RESPIRATORY_TRACT | 3 refills | Status: DC

## 2023-05-13 NOTE — Patient Instructions (Signed)
Lipid panel at your convenience.  Medications sent.  Consider CT lung cancer screening.  Follow up in 6 months.

## 2023-05-13 NOTE — Progress Notes (Unsigned)
Subjective:  Patient ID: Dawn Meyer, female    DOB: 09/15/1955  Age: 67 y.o. MRN: [id number]  CC:   Chief Complaint  Patient presents with   COPD   Hypertension    HPI:  67 year old female with hypertension, COPD, osteoporosis, rheumatoid arthritis, tobacco abuse presents for follow-up.  Patient has a current skin cancer that will be removed in the near future.  She has what appears to be a lymph node in the surrounding area as well.  Has a consultation with ENT.  Humira is going to be stopped due to this underlying skin cancer.  She follows with rheumatology.  Patient COPD is stable on Trelegy and as needed albuterol.  Needs refills.  Patient states that she would like to have prednisone on hand for flares if they occur before she can be seen.  Patient is due for pneumococcal vaccine.  She declines today.  Patient is a candidate for lung cancer screening and wants to wait on this as well.  Hypertension stable on amlodipine.  Patient has had recent labs but needs lipid panel.  Last LDL was 103.  Patient Active Problem List   Diagnosis Date Noted   Hyperlipidemia 05/14/2023   COPD (chronic obstructive pulmonary disease) (HCC) 07/02/2022   Essential hypertension 10/10/2021   Allergic rhinitis 11/17/2017   Tobacco abuse 11/17/2017   Rheumatoid arthritis involving multiple sites with positive rheumatoid factor (HCC) 08/18/2016   OP (osteoporosis) 08/18/2016    Social Hx   Social History   Socioeconomic History   Marital status: Married    Spouse name: Not on file   Number of children: Not on file   Years of education: Not on file   Highest education level: Not on file  Occupational History   Not on file  Tobacco Use   Smoking status: Every Day    Current packs/day: 0.15    Average packs/day: 0.2 packs/day for 40.0 years (6.0 ttl pk-yrs)    Types: Cigarettes    Passive exposure: Never   Smokeless tobacco: Never  Vaping Use   Vaping status: Never Used   Substance and Sexual Activity   Alcohol use: No   Drug use: No   Sexual activity: Not on file  Other Topics Concern   Not on file  Social History Narrative   Not on file   Social Drivers of Health   Financial Resource Strain: Not on file  Food Insecurity: Not on file  Transportation Needs: Not on file  Physical Activity: Not on file  Stress: Not on file  Social Connections: Not on file    Review of Systems Per HPI  Objective:  BP 136/70   Pulse 72   Temp 98.4 F (36.9 C)   Ht 5' 6.5" (1.689 m)   Wt 156 lb (70.8 kg)   SpO2 99%   BMI 24.80 kg/m      05/13/2023   10:36 AM 05/13/2023   10:32 AM 04/14/2023   10:12 AM  BP/Weight  Systolic BP 136 140 123  Diastolic BP 70 70 77  Wt. (Lbs)  156 156.4  BMI  24.8 kg/m2 24.87 kg/m2    Physical Exam Vitals and nursing note reviewed.  Constitutional:      General: She is not in acute distress.    Appearance: Normal appearance.  HENT:     Head: Normocephalic and atraumatic.  Eyes:     General:        Right eye: No discharge.  Left eye: No discharge.     Conjunctiva/sclera: Conjunctivae normal.  Cardiovascular:     Rate and Rhythm: Normal rate and regular rhythm.  Pulmonary:     Effort: Pulmonary effort is normal.     Breath sounds: Normal breath sounds. No wheezing, rhonchi or rales.  Neurological:     Mental Status: She is alert.  Psychiatric:        Mood and Affect: Mood normal.        Behavior: Behavior normal.     Lab Results  Component Value Date   WBC 8.0 04/10/2023   HGB 13.4 04/10/2023   HCT 40.3 04/10/2023   PLT 179 04/10/2023   GLUCOSE 99 04/10/2023   CHOL 172 01/08/2021   TRIG 122 01/08/2021   HDL 47 01/08/2021   LDLCALC 103 (H) 01/08/2021   ALT 11 04/10/2023   AST 16 04/10/2023   NA 142 04/10/2023   K 3.9 04/10/2023   CL 107 (H) 04/10/2023   CREATININE 0.98 04/10/2023   BUN 10 04/10/2023   CO2 21 04/10/2023   TSH 0.702 09/12/2017     Assessment & Plan:   Problem List  Items Addressed This Visit       Cardiovascular and Mediastinum   Essential hypertension   Stable on amlodipine.  Continue.        Respiratory   COPD (chronic obstructive pulmonary disease) (HCC) - Primary   Stable.  Continue Trelegy and as needed albuterol.  Prednisone burst prescribed to be used for flares.      Relevant Medications   Fluticasone-Umeclidin-Vilant (TRELEGY ELLIPTA) 100-62.5-25 MCG/ACT AEPB   albuterol (VENTOLIN HFA) 108 (90 Base) MCG/ACT inhaler   predniSONE (DELTASONE) 50 MG tablet     Other   Hyperlipidemia   Relevant Orders   Lipid panel   Tobacco abuse   Recommended smoking cessation, lung cancer screening, and pneumococcal vaccination.  She declines lung cancer screening and pneumococcal vaccination at this time.      Relevant Medications   albuterol (VENTOLIN HFA) 108 (90 Base) MCG/ACT inhaler    Meds ordered this encounter  Medications   Fluticasone-Umeclidin-Vilant (TRELEGY ELLIPTA) 100-62.5-25 MCG/ACT AEPB    Sig: Inhale 1 puff into the lungs daily.    Dispense:  1 each    Refill:  11   albuterol (VENTOLIN HFA) 108 (90 Base) MCG/ACT inhaler    Sig: INHALE TWO PUFFS INTO THE LUNGS EVERY SIX HOURS AS NEEDED FOR WHEEZING OR SHORTNESS OF BREATH    Dispense:  18 g    Refill:  3   predniSONE (DELTASONE) 50 MG tablet    Sig: Take 1 tablet (50 mg total) by mouth daily for 5 days.    Dispense:  5 tablet    Refill:  0    Follow-up:  Return in about 6 months (around 11/11/2023).  Everlene Other DO [facility]

## 2023-05-14 DIAGNOSIS — E785 Hyperlipidemia, unspecified: Secondary | ICD-10-CM | POA: Insufficient documentation

## 2023-05-14 NOTE — Assessment & Plan Note (Signed)
Stable on amlodipine.  Continue.

## 2023-05-14 NOTE — Assessment & Plan Note (Addendum)
Stable.  Continue Trelegy and as needed albuterol.  Prednisone burst prescribed to be used for flares.

## 2023-05-14 NOTE — Assessment & Plan Note (Signed)
Recommended smoking cessation, lung cancer screening, and pneumococcal vaccination.  She declines lung cancer screening and pneumococcal vaccination at this time.

## 2023-05-19 ENCOUNTER — Other Ambulatory Visit (HOSPITAL_COMMUNITY): Payer: Self-pay

## 2023-05-19 NOTE — Telephone Encounter (Signed)
Received fax from [employer] for ORENCIA SQ patient assistance, patient's application has been DENIED because patient's household income is over our current eligibility criteria. A letter has been sent to patient  Case # [id number]  Phone #: [phone] Fax #: [phone]  Her monthly copay is $100 with her [employer] (former [employer])  Chesley Mires, PharmD, MPH, BCPS, CPP Clinical Pharmacist (Rheumatology and Pulmonology)

## 2023-05-27 DIAGNOSIS — Z9889 Other specified postprocedural states: Secondary | ICD-10-CM

## 2023-05-27 HISTORY — DX: Other specified postprocedural states: Z98.890

## 2023-05-27 HISTORY — PX: EYE SURGERY: SHX253

## 2023-06-02 NOTE — Telephone Encounter (Signed)
 Called patient to inform her that Orencia PAP was DENIED. She was informed that without PAP Orencia would cost $100/month. She agreed with the cost and is still interested in taking this medication.   She is following two surgeons for her skin cancer, but does not have any surgeries scheduled. She states she probably will not be scheduled for surgery until several weeks from now. Her last Humira  injection was on 05/26/2023.  Tolu Dom Haverland, PharmD [facility] PGY-1

## 2023-06-03 ENCOUNTER — Other Ambulatory Visit: Payer: Self-pay | Admitting: *Deleted

## 2023-06-03 DIAGNOSIS — Z79899 Other long term (current) drug therapy: Secondary | ICD-10-CM

## 2023-06-03 DIAGNOSIS — M0579 Rheumatoid arthritis with rheumatoid factor of multiple sites without organ or systems involvement: Secondary | ICD-10-CM

## 2023-06-03 MED ORDER — HUMIRA (2 PEN) 40 MG/0.4ML ~~LOC~~ AJKT: 40.0000 mg | AUTO-INJECTOR | SUBCUTANEOUS | 0 refills | Status: DC

## 2023-06-03 NOTE — Telephone Encounter (Signed)
 Last Fill: 02/23/2023  Labs: 04/10/2023  CMP stable. Alk phos remains borderline elevated but has trended down.   Absolute eosinophils are elevated. Rest of CBC Wnl.  We will continue to monitor.  TB Gold: 11/11/2022   TB gold negative   Next Visit: 09/24/2023  Last Visit: 04/14/2023  IK:Myzlfjunpi arthritis involving multiple sites with positive rheumatoid factor   Current Dose per office note 04/14/2023: Humira  every 2-3 weeks.   Okay to refill Humira ?

## 2023-06-04 NOTE — Progress Notes (Signed)
 PATIENT: Dawn Meyer MR #: [id number] SEX: female  AGE: 68 y.o. DATE OF SERVICE: 06/04/2023 DOB: [birth year]  OTOLARYNGOLOGY NEW PATIENT CLINIC NOTE  CONSULTATION REQUESTED BY:  Jayce Garrett Cook, DO  06/04/2023  Dear Jayce Honora Ahle, DO:  I had the absolute pleasure of seeing Dawn Meyer in the Otolaryngology, Head & Neck Surgery Clinic at [facility] in initial evaluation.  Thank you for your kind referral of this pleasant patient. 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 She is accompanied by her husband.  She has a history of basosquamous carcinoma of the right cheek, which was previously treated with cryotherapy by her dermatologist. The lesion recurred aggressively between 6 to 12 months ago. She reports no pain in the area but notes sensitivity upon touch. She reports no numbness on the side of her head or face, facial weakness, or droop. She tends to chew more on the right side than the left. She has no lumps or bumps in her neck. She had a checkup with her regular doctor in early December and everything seemed fine. She has a history of Mohs surgery and has been informed about the potential risk of facial paralysis associated with her current condition. She is not aware of any diabetes. She is scheduled for Mohs surgery on 06/23/2023 and repair with me 06/24/2023. She expresses concern about returning home with an open wound post-surgery.  She also has a history of rheumatoid arthritis and is on Humira . She has a history of squamous cell skin cancers, which her rheumatologist suspects may be related to her long-term use of Humira  injections. They are considering transitioning her to another medication but are waiting until her current issues are resolved.  She takes Trelegy for  breathing issues and has been diagnosed with low-grade COPD, which is not causing her any issues. She is a smoker.  SOCIAL HISTORY She admits to smoking.  ALLERGIES The patient has no known allergies.  MEDICATIONS Humira , Trelegy   PAST MEDICAL HISTORY. Past Medical History:  Diagnosis Date  . Arthritis, rheumatoid (CMD)   . Asthma   . Hypertension   . Prolapsed uterus   . Rheumatoid arthritis (CMD)   . Skin cancer     PAST SURGICAL HISTORY Past Surgical History:  Procedure Laterality Date  . CYSTOSCOPY N/A 04/28/2022   Procedure: CYSTOSCOPY;  Surgeon: Alvia Dorothyann Caldron, MD;  Location: [facility];  Service: Gynecology;  Laterality: N/A;  . MOHS SURGERY     Procedure: MOHS SURGERY  . VAGINAL PROLAPSE REPAIR N/A 04/28/2022   Procedure: UTEROSACRAL SUSPENSION; trachelectomy ([place] procedure)- need cervical dilators;  Surgeon: Alvia Dorothyann Caldron, MD;  Location: [facility];  Service: Gynecology;  Laterality: N/A;    ALLERGIES Doxycycline   MEDICATIONS  Current Outpatient Medications:  .  acetaminophen  (TYLENOL ) 650 mg ER tablet, Take 1,300 mg by mouth every 8 (eight) hours as needed (pain)., Disp: , Rfl:  .  albuterol  HFA (PROVENTIL  HFA;VENTOLIN  HFA;PROAIR  HFA) 90 mcg/actuation inhaler, INHALE TWO PUFFS INTO THE LUNGS EVERY SIX HOURS AS NEEDED FOR WHEEZING OR SHORTNESS OF BREATH, Disp: , Rfl:  .  amLODIPine  (NORVASC ) 5 mg tablet, Take 5 mg by mouth Once Daily., Disp: , Rfl:  .  Humira ,CF, Pen 40 mg/0.4 mL pnkt injection, INJECT 1 PEN UNDER THE SKIN EVERY 14 DAYS.,  Disp: , Rfl:  .  estradioL (ESTRACE) 0.01 % (0.1 mg/gram) vaginal cream, Insert  into the vagina. (Patient not taking: Reported on 06/04/2023), Disp: , Rfl:  .  fexofenadine (ALLEGRA) 180 mg tablet, Take 180 mg by mouth. (Patient not taking: Reported on 06/04/2023), Disp: , Rfl:  .  ibuprofen  (MOTRIN ) 200 mg tablet, Take 200 mg by mouth. (Patient not taking: Reported on 06/04/2023), Disp: , Rfl:  .   niacinamide, bulk, 100 % powd, Take 1 tablet by mouth Once Daily. (Patient not taking: Reported on 06/04/2023), Disp: , Rfl:  .  oxyCODONE  (ROXICODONE ) 5 mg immediate release tablet, 5 mg every 6 (six) hours as needed for up to 20 doses. (Patient not taking: Reported on 06/04/2023), Disp: 20 tablet, Rfl: 0   SOCIAL HISTORY: Social History   Tobacco Use  . Smoking status: Every Day  . Smokeless tobacco: Never  Vaping Use  . Vaping status: Never Used  Substance Use Topics  . Alcohol use: Yes    Alcohol/week: 1.0 standard drink of alcohol  . Drug use: Never    FAMILY HISTORY No family history on file.   Review Of Systems: All systems were reviewed and were negative except for those mentioned in the HPI.    Past medical history, past surgical history, drug allergies, medications, and pertinent social and family medical history were  reviewed with the patient, and details of these were provided are in the Otolaryngology  patient questionnaire.  This was reviewed and verified today.  PHYSICAL EXAMINATION: Temp 98.2 F (36.8 C)   Wt 69.9 kg (154 lb)   BMI 24.12 kg/m    General/Constitutional: Patient is a well-nourished, well-developed female appearing stated age in no acute distress. Voice quality is good.   Psych: Alert & oriented to time, place and person. Answers questions appropriately.  Skin/scalp : Normal and without lesions. No rashes, ulcerations or masses noted.  Head: Normocephalic. No asymmetries noted. No tenderness to palpation of the soft tissue or bony structures of the face or skull.  Eyes: Normal extraocular motions without diplopia.  Pupils equal round and reactive to light.  No nystagmus noted.  ENT: Ears: Right Ear: Normal pinna, EAC clear; TM intact, no perforation, no effusion, no global retraction, no retraction pockets. Left Ear: Normal pinna, EAC clear; TM intact, no perforation, no effusion, no global retraction, no retraction pockets.  Nose and Sinuses:  No paranasal sinus tenderness.  Mucosa without lesions.  No polyps are seen. Septum midline and straight.   Mouth: Teeth, gums, tongue, floor of the mouth, retromolar trigone, buccal mucosa, hard palate - normal without lesions. Normal jaw opening. Occlusion - normal.  Oropharynx: Tonsillar fossae, pharyngeal walls, base of tongue - normal. No bleeding or masses.  Neck/Musculoskeletal: Patient's right face has and approximately 2 cm lesion on the right preauricular cheek that slides mobile over the parotid posterior to this the skin is somewhat indurated and crusting by the tragus. Severe photoaging of her skin with cervical skin laxity.  Cranial Nerves/Neuro: Function II through XII - normal. No focal deficits noted.  IMAGING/RESULTS  - Reviewed in detail as follows: none Results     MEDICAL DECISION MAKING IMPRESSION: 1. Basosquamous carcinoma of skin   2. Acquired deformity of face   3. Rheumatoid arthritis, involving unspecified site, unspecified whether rheumatoid factor present (CMD)   4. Tobacco abuse      RECOMMENDATIONS: Assessment & Plan 1. Basosquamous carcinoma of the right cheek. The lesion has been present for approximately 6  to 12 months. It is likely due to sun damage. The longer it remains untreated, the higher the risk of it penetrating deeper or metastasizing to the lymph nodes. She is scheduled for excision on 06/23/2023 with Dr. Lloyd,  The following day, she will be seen at [facility] for repair of the facial defect using an adjacent tissue transfer technique. The potential risks associated with surgery, including pain, bleeding, infection, scarring, numbness, poor cosmetic outcome, need for revision surgery, and risks of anesthesia, were thoroughly discussed. The possibility of facial paralysis was also addressed. She was reassured that the infection risk is low despite being on Humira .   A CT scan of the neck with contrast will be ordered to evaluate for  any lymph node involvement. The procedure and its implications were explained in detail. She was advised to complete the CT scan at least a few days prior to the scheduled surgery. If the CT scan reveals deep involvement in the parotid gland or neck, a more extensive surgery, potentially involving a parotidectomy, may be necessary.   Patient is scheduled for Mohs reconstruction right cheek with me on on June 24, 2023. Will call patient once CT neck is completed to evaluate depth of the lesion and lymph node basin.  Hopefully this will not demonstrate the role for more invasive surgery.   Orders Placed This Encounter  Procedures  . CT Soft Tissue Neck W Contrast    No orders of the defined types were placed in this encounter.    Medical Decision Making:  #/Compl Problems  3                          Data Rev  2                 Management 3             (1-Straightforward, 2-Low, 3- Moderate, 4-High)  All questions were answered. Patient is amenable to plan as discussed above.  Thank you for this kind consultation of this patient to our department, and we feel honored to be of service. Please feel free to contact me at any point with to discuss or answer any questions pertaining to this patient or any other matter.   Sincerely,  Electronically signed by:  Elspeth Coddington, MD  Staff Physician Facial Plastic & Reconstructive Surgery Otolaryngology - Head and Neck Surgery [facility] [place]

## 2023-06-08 ENCOUNTER — Other Ambulatory Visit (HOSPITAL_COMMUNITY): Payer: Self-pay | Admitting: Otolaryngology

## 2023-06-08 ENCOUNTER — Other Ambulatory Visit (HOSPITAL_COMMUNITY): Payer: Medicare PPO

## 2023-06-08 DIAGNOSIS — C4499 Other specified malignant neoplasm of skin, unspecified: Secondary | ICD-10-CM

## 2023-06-08 DIAGNOSIS — M952 Other acquired deformity of head: Secondary | ICD-10-CM

## 2023-06-12 ENCOUNTER — Ambulatory Visit (HOSPITAL_COMMUNITY)
Admission: RE | Admit: 2023-06-12 | Discharge: 2023-06-12 | Disposition: A | Discharge status: Home / Self Care | Payer: Medicare PPO | Source: Ambulatory Visit | Attending: Otolaryngology

## 2023-06-12 DIAGNOSIS — C4499 Other specified malignant neoplasm of skin, unspecified: Secondary | ICD-10-CM | POA: Insufficient documentation

## 2023-06-12 DIAGNOSIS — M952 Other acquired deformity of head: Secondary | ICD-10-CM | POA: Insufficient documentation

## 2023-06-12 MED ORDER — IOHEXOL 300 MG/ML  SOLN
75.0000 mL | Freq: Once | INTRAMUSCULAR | Status: AC | PRN
  Administered 2023-06-12: 75 mL via INTRAVENOUS

## 2023-06-17 ENCOUNTER — Encounter (HOSPITAL_BASED_OUTPATIENT_CLINIC_OR_DEPARTMENT_OTHER): Payer: Self-pay | Admitting: Otolaryngology

## 2023-06-17 ENCOUNTER — Other Ambulatory Visit: Payer: Self-pay

## 2023-06-19 ENCOUNTER — Encounter (HOSPITAL_BASED_OUTPATIENT_CLINIC_OR_DEPARTMENT_OTHER)
Admission: RE | Admit: 2023-06-19 | Discharge: 2023-06-19 | Disposition: A | Discharge status: Home / Self Care | Payer: Medicare PPO | Source: Ambulatory Visit | Attending: Otolaryngology | Admitting: Otolaryngology

## 2023-06-19 ENCOUNTER — Other Ambulatory Visit: Payer: Self-pay | Admitting: Otolaryngology

## 2023-06-19 DIAGNOSIS — Z01818 Encounter for other preprocedural examination: Secondary | ICD-10-CM | POA: Insufficient documentation

## 2023-06-24 ENCOUNTER — Other Ambulatory Visit: Payer: Self-pay

## 2023-06-24 ENCOUNTER — Ambulatory Visit (HOSPITAL_BASED_OUTPATIENT_CLINIC_OR_DEPARTMENT_OTHER): Payer: Self-pay | Admitting: Anesthesiology

## 2023-06-24 ENCOUNTER — Observation Stay (HOSPITAL_COMMUNITY)
Admission: RE | Admit: 2023-06-24 | Discharge: 2023-06-25 | Disposition: A | Discharge status: Home / Self Care | Payer: Medicare PPO | Attending: Otolaryngology | Admitting: Otolaryngology

## 2023-06-24 ENCOUNTER — Encounter (HOSPITAL_BASED_OUTPATIENT_CLINIC_OR_DEPARTMENT_OTHER)
Admission: RE | Disposition: A | Discharge status: Home / Self Care | Payer: Self-pay | Source: Home / Self Care | Attending: Otolaryngology

## 2023-06-24 ENCOUNTER — Encounter (HOSPITAL_BASED_OUTPATIENT_CLINIC_OR_DEPARTMENT_OTHER): Payer: Self-pay | Admitting: Otolaryngology

## 2023-06-24 DIAGNOSIS — G510 Bell's palsy: Secondary | ICD-10-CM | POA: Diagnosis not present

## 2023-06-24 DIAGNOSIS — I10 Essential (primary) hypertension: Secondary | ICD-10-CM | POA: Diagnosis not present

## 2023-06-24 DIAGNOSIS — M952 Other acquired deformity of head: Principal | ICD-10-CM | POA: Diagnosis present

## 2023-06-24 DIAGNOSIS — J449 Chronic obstructive pulmonary disease, unspecified: Secondary | ICD-10-CM

## 2023-06-24 DIAGNOSIS — E785 Hyperlipidemia, unspecified: Secondary | ICD-10-CM | POA: Diagnosis not present

## 2023-06-24 DIAGNOSIS — F17210 Nicotine dependence, cigarettes, uncomplicated: Secondary | ICD-10-CM | POA: Diagnosis not present

## 2023-06-24 DIAGNOSIS — Z79899 Other long term (current) drug therapy: Secondary | ICD-10-CM | POA: Insufficient documentation

## 2023-06-24 DIAGNOSIS — C44319 Basal cell carcinoma of skin of other parts of face: Principal | ICD-10-CM | POA: Insufficient documentation

## 2023-06-24 DIAGNOSIS — H02239 Paralytic lagophthalmos unspecified eye, unspecified eyelid: Secondary | ICD-10-CM | POA: Diagnosis not present

## 2023-06-24 HISTORY — PX: ADJACENT TISSUE TRANSFER/TISSUE REARRANGEMENT: SHX6829

## 2023-06-24 HISTORY — DX: Gastro-esophageal reflux disease without esophagitis: K21.9

## 2023-06-24 HISTORY — DX: Essential (primary) hypertension: I10

## 2023-06-24 HISTORY — DX: Chronic obstructive pulmonary disease, unspecified: J44.9

## 2023-06-24 HISTORY — PX: EXCISION MASS HEAD: SHX6702

## 2023-06-24 HISTORY — DX: Nicotine dependence, unspecified, uncomplicated: F17.200

## 2023-06-24 SURGERY — EXCISION, MASS, HEAD
Anesthesia: General | Site: Neck | Laterality: Right

## 2023-06-24 MED ORDER — BACITRACIN ZINC 500 UNIT/GM EX OINT
TOPICAL_OINTMENT | CUTANEOUS | Status: DC | PRN
  Administered 2023-06-24: 1 via TOPICAL

## 2023-06-24 MED ORDER — EPHEDRINE SULFATE (PRESSORS) 50 MG/ML IJ SOLN
INTRAMUSCULAR | Status: DC | PRN
  Administered 2023-06-24 (×3): 5 mg via INTRAVENOUS
  Administered 2023-06-24: 10 mg via INTRAVENOUS

## 2023-06-24 MED ORDER — DEXMEDETOMIDINE HCL IN NACL 80 MCG/20ML IV SOLN
INTRAVENOUS | Status: AC
  Filled 2023-06-24: qty 40

## 2023-06-24 MED ORDER — 0.9 % SODIUM CHLORIDE (POUR BTL) OPTIME
TOPICAL | Status: DC | PRN
  Administered 2023-06-24: 150 mL

## 2023-06-24 MED ORDER — HYDROMORPHONE HCL 1 MG/ML IJ SOLN
INTRAMUSCULAR | Status: DC | PRN
Start: 2023-06-24 — End: 2023-06-24
  Administered 2023-06-24: .5 mg via INTRAVENOUS

## 2023-06-24 MED ORDER — LIDOCAINE-EPINEPHRINE 1 %-1:100000 IJ SOLN
INTRAMUSCULAR | Status: DC | PRN
  Administered 2023-06-24: 4 mL
  Administered 2023-06-24: 13.5 mL

## 2023-06-24 MED ORDER — ACETAMINOPHEN 500 MG PO TABS
1000.0000 mg | ORAL_TABLET | Freq: Once | ORAL | Status: AC
  Administered 2023-06-24: 1000 mg via ORAL

## 2023-06-24 MED ORDER — ROCURONIUM BROMIDE 100 MG/10ML IV SOLN
INTRAVENOUS | Status: DC | PRN
  Administered 2023-06-24: 50 mg via INTRAVENOUS

## 2023-06-24 MED ORDER — ARTIFICIAL TEARS OPHTHALMIC OINT
TOPICAL_OINTMENT | OPHTHALMIC | Status: AC
  Filled 2023-06-24: qty 3.5

## 2023-06-24 MED ORDER — ACETAMINOPHEN 325 MG PO TABS
650.0000 mg | ORAL_TABLET | Freq: Four times a day (QID) | ORAL | Status: DC
  Administered 2023-06-24 – 2023-06-25 (×3): 650 mg via ORAL
  Filled 2023-06-24 (×5): qty 2

## 2023-06-24 MED ORDER — FENTANYL CITRATE (PF) 100 MCG/2ML IJ SOLN
INTRAMUSCULAR | Status: DC | PRN
  Administered 2023-06-24 (×2): 50 ug via INTRAVENOUS
  Administered 2023-06-24 (×2): 25 ug via INTRAVENOUS
  Administered 2023-06-24: 50 ug via INTRAVENOUS

## 2023-06-24 MED ORDER — POVIDONE-IODINE 5 % OP SOLN
OPHTHALMIC | Status: DC | PRN
  Administered 2023-06-24: 1

## 2023-06-24 MED ORDER — PROPOFOL 10 MG/ML IV BOLUS
INTRAVENOUS | Status: DC | PRN
  Administered 2023-06-24: 150 mg via INTRAVENOUS
  Administered 2023-06-24: 100 mg via INTRAVENOUS
  Administered 2023-06-24: 50 mg via INTRAVENOUS

## 2023-06-24 MED ORDER — ONDANSETRON HCL 4 MG/2ML IJ SOLN
INTRAMUSCULAR | Status: DC | PRN
  Administered 2023-06-24: 4 mg via INTRAVENOUS

## 2023-06-24 MED ORDER — LIDOCAINE 2% (20 MG/ML) 5 ML SYRINGE
INTRAMUSCULAR | Status: AC
  Filled 2023-06-24: qty 5

## 2023-06-24 MED ORDER — CIPROFLOXACIN-DEXAMETHASONE 0.3-0.1 % OT SUSP
OTIC | Status: AC
Start: 2023-06-24 — End: ?
  Filled 2023-06-24: qty 7.5

## 2023-06-24 MED ORDER — EPHEDRINE 5 MG/ML INJ
INTRAVENOUS | Status: AC
  Filled 2023-06-24: qty 5

## 2023-06-24 MED ORDER — PROPOFOL 500 MG/50ML IV EMUL
INTRAVENOUS | Status: AC
  Filled 2023-06-24: qty 50

## 2023-06-24 MED ORDER — AMLODIPINE BESYLATE 10 MG PO TABS
10.0000 mg | ORAL_TABLET | Freq: Every day | ORAL | Status: DC
  Administered 2023-06-25: 10 mg via ORAL
  Filled 2023-06-24: qty 1

## 2023-06-24 MED ORDER — CEFAZOLIN SODIUM-DEXTROSE 2-4 GM/100ML-% IV SOLN
2.0000 g | INTRAVENOUS | Status: AC
  Administered 2023-06-24: 2 g via INTRAVENOUS

## 2023-06-24 MED ORDER — DEXAMETHASONE SODIUM PHOSPHATE 10 MG/ML IJ SOLN
INTRAMUSCULAR | Status: DC | PRN
  Administered 2023-06-24: 10 mg via INTRAVENOUS

## 2023-06-24 MED ORDER — FENTANYL CITRATE (PF) 100 MCG/2ML IJ SOLN: 25.0000 ug | INTRAMUSCULAR | Status: DC | PRN

## 2023-06-24 MED ORDER — SUGAMMADEX SODIUM 200 MG/2ML IV SOLN
INTRAVENOUS | Status: DC | PRN
  Administered 2023-06-24: 200 mg via INTRAVENOUS

## 2023-06-24 MED ORDER — PHENYLEPHRINE HCL-NACL 20-0.9 MG/250ML-% IV SOLN
INTRAVENOUS | Status: DC | PRN
  Administered 2023-06-24: 160 ug via INTRAVENOUS
  Administered 2023-06-24 (×2): 120 ug via INTRAVENOUS
  Administered 2023-06-24 (×4): 80 ug via INTRAVENOUS
  Administered 2023-06-24: 40 ug via INTRAVENOUS

## 2023-06-24 MED ORDER — OXYCODONE HCL 5 MG PO TABS: 5.0000 mg | ORAL_TABLET | Freq: Once | ORAL | Status: DC | PRN

## 2023-06-24 MED ORDER — OXYCODONE HCL 5 MG PO TABS: 5.0000 mg | ORAL_TABLET | ORAL | 0 refills | Status: AC | PRN

## 2023-06-24 MED ORDER — CEFAZOLIN SODIUM-DEXTROSE 2-4 GM/100ML-% IV SOLN
INTRAVENOUS | Status: AC
  Filled 2023-06-24: qty 100

## 2023-06-24 MED ORDER — DEXAMETHASONE SODIUM PHOSPHATE 10 MG/ML IJ SOLN
INTRAMUSCULAR | Status: AC
  Filled 2023-06-24: qty 1

## 2023-06-24 MED ORDER — LIDOCAINE HCL (CARDIAC) PF 100 MG/5ML IV SOSY
PREFILLED_SYRINGE | INTRAVENOUS | Status: DC | PRN
  Administered 2023-06-24: 80 mg via INTRAVENOUS

## 2023-06-24 MED ORDER — MIDAZOLAM HCL 2 MG/2ML IJ SOLN
INTRAMUSCULAR | Status: AC
  Filled 2023-06-24: qty 2

## 2023-06-24 MED ORDER — OXYCODONE HCL 5 MG PO TABS: 5.0000 mg | ORAL_TABLET | ORAL | Status: DC | PRN

## 2023-06-24 MED ORDER — HYDROMORPHONE HCL 1 MG/ML IJ SOLN
INTRAMUSCULAR | Status: AC
  Filled 2023-06-24: qty 0.5

## 2023-06-24 MED ORDER — FENTANYL CITRATE (PF) 100 MCG/2ML IJ SOLN
INTRAMUSCULAR | Status: AC
  Filled 2023-06-24: qty 2

## 2023-06-24 MED ORDER — AMISULPRIDE (ANTIEMETIC) 5 MG/2ML IV SOLN: 10.0000 mg | Freq: Once | INTRAVENOUS | Status: DC | PRN

## 2023-06-24 MED ORDER — ONDANSETRON HCL 4 MG/2ML IJ SOLN
INTRAMUSCULAR | Status: AC
  Filled 2023-06-24: qty 2

## 2023-06-24 MED ORDER — POVIDONE-IODINE 5 % OP SOLN
OPHTHALMIC | Status: AC
  Filled 2023-06-24: qty 60

## 2023-06-24 MED ORDER — CIPROFLOXACIN-DEXAMETHASONE 0.3-0.1 % OT SUSP
OTIC | Status: DC | PRN
  Administered 2023-06-24: 10 [drp] via OTIC

## 2023-06-24 MED ORDER — ACETAMINOPHEN 500 MG PO TABS
ORAL_TABLET | ORAL | Status: AC
  Filled 2023-06-24: qty 2

## 2023-06-24 MED ORDER — OXYCODONE HCL 5 MG/5ML PO SOLN: 5.0000 mg | Freq: Once | ORAL | Status: DC | PRN

## 2023-06-24 MED ORDER — LACTATED RINGERS IV SOLN: INTRAVENOUS | Status: AC

## 2023-06-24 MED ORDER — MIDAZOLAM HCL 5 MG/5ML IJ SOLN
INTRAMUSCULAR | Status: DC | PRN
  Administered 2023-06-24: 2 mg via INTRAVENOUS

## 2023-06-24 MED ORDER — PROPOFOL 10 MG/ML IV BOLUS
INTRAVENOUS | Status: AC
  Filled 2023-06-24: qty 20

## 2023-06-24 MED ORDER — FENTANYL CITRATE (PF) 250 MCG/5ML IJ SOLN: INTRAMUSCULAR | Status: DC | PRN

## 2023-06-24 MED ORDER — IBUPROFEN 200 MG PO TABS
400.0000 mg | ORAL_TABLET | Freq: Four times a day (QID) | ORAL | Status: DC | PRN
  Administered 2023-06-24 – 2023-06-25 (×2): 400 mg via ORAL
  Filled 2023-06-24 (×2): qty 2

## 2023-06-24 MED ORDER — FLUTICASONE FUROATE-VILANTEROL 100-25 MCG/ACT IN AEPB
1.0000 | INHALATION_SPRAY | Freq: Every day | RESPIRATORY_TRACT | Status: DC
  Filled 2023-06-24: qty 28

## 2023-06-24 MED ORDER — ALBUTEROL SULFATE HFA 108 (90 BASE) MCG/ACT IN AERS: 1.0000 | INHALATION_SPRAY | Freq: Four times a day (QID) | RESPIRATORY_TRACT | Status: DC | PRN

## 2023-06-24 MED ORDER — ONDANSETRON HCL 4 MG/2ML IJ SOLN
INTRAMUSCULAR | Status: AC
Start: 2023-06-24 — End: ?
  Filled 2023-06-24: qty 2

## 2023-06-24 MED ORDER — PHENYLEPHRINE 80 MCG/ML (10ML) SYRINGE FOR IV PUSH (FOR BLOOD PRESSURE SUPPORT)
PREFILLED_SYRINGE | INTRAVENOUS | Status: AC
  Filled 2023-06-24: qty 10

## 2023-06-24 MED ORDER — ROCURONIUM BROMIDE 10 MG/ML (PF) SYRINGE
PREFILLED_SYRINGE | INTRAVENOUS | Status: AC
Start: 2023-06-24 — End: ?
  Filled 2023-06-24: qty 10

## 2023-06-24 MED ORDER — CIPROFLOXACIN-DEXAMETHASONE 0.3-0.1 % OT SUSP
4.0000 [drp] | Freq: Two times a day (BID) | OTIC | Status: DC
  Administered 2023-06-24 – 2023-06-25 (×2): 4 [drp] via OTIC
  Filled 2023-06-24: qty 7.5
  Filled 2023-06-24: qty 0.2

## 2023-06-24 SURGICAL SUPPLY — 120 items
APPLICATOR COTTON TIP 6 STRL (MISCELLANEOUS) IMPLANT
APPLICATOR COTTON TIP 6IN STRL (MISCELLANEOUS) IMPLANT
APPLICATOR DR MATTHEWS STRL (MISCELLANEOUS) ×3 IMPLANT
BENZOIN TINCTURE PRP APPL 2/3 (GAUZE/BANDAGES/DRESSINGS) IMPLANT
BLADE CLIPPER SURG (BLADE) IMPLANT
BLADE SURG 15 STRL LF DISP TIS (BLADE) ×7 IMPLANT
BNDG ELASTIC 3INX 5YD STR LF (GAUZE/BANDAGES/DRESSINGS) IMPLANT
CANISTER SUCT 1200ML W/VALVE (MISCELLANEOUS) ×3 IMPLANT
CLIP TI WIDE RED SMALL 6 (CLIP) ×3 IMPLANT
COAGULATOR SUCT 8FR VV (MISCELLANEOUS) IMPLANT
CORD BIPOLAR FORCEPS 12FT (ELECTRODE) ×1 IMPLANT
COTTONBALL LRG STERILE PKG (GAUZE/BANDAGES/DRESSINGS) ×2 IMPLANT
DRAIN CHANNEL 15F RND FF W/TCR (WOUND CARE) ×1 IMPLANT
DRAPE HEAD BAR (DRAPES) IMPLANT
DRAPE INCISE 23X17 STRL (DRAPES) IMPLANT
DRAPE INCISE IOBAN 23X17 STRL (DRAPES) IMPLANT
DRAPE U-SHAPE 76X120 STRL (DRAPES) ×1 IMPLANT
DRESSING NASAL POPE 10X1.5X2.5 (GAUZE/BANDAGES/DRESSINGS) ×2 IMPLANT
DRSG GLASSCOCK MASTOID ADT (GAUZE/BANDAGES/DRESSINGS) IMPLANT
DRSG GLASSCOCK MASTOID PED (GAUZE/BANDAGES/DRESSINGS) IMPLANT
DRSG NASAL POPE 10X1.5X2.5 (GAUZE/BANDAGES/DRESSINGS) IMPLANT
DRSG TEGADERM 2-3/8X2-3/4 SM (GAUZE/BANDAGES/DRESSINGS) ×2 IMPLANT
DRSG TEGADERM 4X4.75 (GAUZE/BANDAGES/DRESSINGS) IMPLANT
DRSG TELFA 3X8 NADH STRL (GAUZE/BANDAGES/DRESSINGS) ×4 IMPLANT
ELECT COATED BLADE 2.86 ST (ELECTRODE) ×1 IMPLANT
ELECT NDL BLADE 2-5/6 (NEEDLE) ×2 IMPLANT
ELECT NEEDLE BLADE 2-5/6 (NEEDLE) ×3 IMPLANT
ELECT REM PT RETURN 9FT ADLT (ELECTROSURGICAL) ×3 IMPLANT
ELECTRODE REM PT RTRN 9FT ADLT (ELECTROSURGICAL) ×2 IMPLANT
EVACUATOR SILICONE 100CC (DRAIN) ×1 IMPLANT
FORCEPS BIPOLAR SPETZLER 8 1.0 (NEUROSURGERY SUPPLIES) ×1 IMPLANT
GAUZE 4X4 16PLY ~~LOC~~+RFID DBL (SPONGE) ×2 IMPLANT
GAUZE PAD ABD 8X10 STRL (GAUZE/BANDAGES/DRESSINGS) IMPLANT
GAUZE SPONGE 2X2 STRL 8-PLY (GAUZE/BANDAGES/DRESSINGS) IMPLANT
GAUZE SPONGE 4X4 12PLY STRL (GAUZE/BANDAGES/DRESSINGS) IMPLANT
GAUZE VASELINE FOILPK 1/2 X 72 (GAUZE/BANDAGES/DRESSINGS) IMPLANT
GAUZE XEROFORM 1X8 LF (GAUZE/BANDAGES/DRESSINGS) IMPLANT
GLOVE BIO SURGEON STRL SZ7.5 (GLOVE) ×4 IMPLANT
GLOVE BIOGEL PI IND STRL 7.0 (GLOVE) ×2 IMPLANT
GLOVE BIOGEL PI IND STRL 8 (GLOVE) ×3 IMPLANT
GLOVE SURG SS PI 6.5 STRL IVOR (GLOVE) ×2 IMPLANT
GOWN STRL REUS W/ TWL LRG LVL3 (GOWN DISPOSABLE) ×4 IMPLANT
GOWN STRL REUS W/ TWL XL LVL3 (GOWN DISPOSABLE) ×3 IMPLANT
HEMOSTAT SURGICEL .5X2 ABSORB (HEMOSTASIS) IMPLANT
LOCATOR NERVE 3 VOLT (DISPOSABLE) ×1 IMPLANT
MARKER SKIN DUAL TIP RULER LAB (MISCELLANEOUS) ×1 IMPLANT
NDL HYPO 22X1.5 SAFETY MO (MISCELLANEOUS) IMPLANT
NDL HYPO 25X1 1.5 SAFETY (NEEDLE) IMPLANT
NDL HYPO 30GX1 BEV (NEEDLE) ×2 IMPLANT
NDL PRECISIONGLIDE 27X1.5 (NEEDLE) ×2 IMPLANT
NEEDLE HYPO 22X1.5 SAFETY MO (MISCELLANEOUS) IMPLANT
NEEDLE HYPO 25X1 1.5 SAFETY (NEEDLE) IMPLANT
NEEDLE HYPO 30GX1 BEV (NEEDLE) IMPLANT
NEEDLE PRECISIONGLIDE 27X1.5 (NEEDLE) ×9 IMPLANT
NS IRRIG 1000ML POUR BTL (IV SOLUTION) ×3 IMPLANT
PACK AMBRUS EAR (MISCELLANEOUS) ×1 IMPLANT
PACK BASIN DAY SURGERY FS (CUSTOM PROCEDURE TRAY) ×3 IMPLANT
PACK ENT DAY SURGERY (CUSTOM PROCEDURE TRAY) ×3 IMPLANT
PAD ALCOHOL SWAB (MISCELLANEOUS) ×2 IMPLANT
PENCIL SMOKE EVACUATOR (MISCELLANEOUS) ×3 IMPLANT
PIN SAFETY STERILE (MISCELLANEOUS) ×1 IMPLANT
POWDER MYRIAD MORCLLS FINE 500 (Miscellaneous) IMPLANT
PWDR MYRIAD MORCELLS FINE 500 (Miscellaneous) ×3 IMPLANT
SHEET MEDIUM DRAPE 40X70 STRL (DRAPES) IMPLANT
SHEILD EYE MED CORNL SHD 22X21 (OPHTHALMIC RELATED) IMPLANT
SHIELD EYE MED CORNL SHD 22X21 (OPHTHALMIC RELATED) IMPLANT
SLEEVE SCD COMPRESS KNEE MED (STOCKING) ×3 IMPLANT
SPIKE FLUID TRANSFER (MISCELLANEOUS) IMPLANT
SPLINT NASAL DENVER LRG BLUSH (MISCELLANEOUS) IMPLANT
SPLINT NASAL DENVER SM/MD BLUS (MISCELLANEOUS) IMPLANT
SPONGE INTESTINAL PEANUT (DISPOSABLE) ×2 IMPLANT
SPONGE SURGIFOAM ABS GEL 12-7 (HEMOSTASIS) IMPLANT
SPONGE T-LAP 18X18 ~~LOC~~+RFID (SPONGE) ×2 IMPLANT
STAPLER SKIN PROX WIDE 3.9 (STAPLE) ×1 IMPLANT
STRIP CLOSURE SKIN 1/2X4 (GAUZE/BANDAGES/DRESSINGS) IMPLANT
STRIP CLOSURE SKIN 1/4X4 (GAUZE/BANDAGES/DRESSINGS) IMPLANT
SUCTION TUBE FRAZIER 10FR DISP (SUCTIONS) ×3 IMPLANT
SUT CHROMIC 4 0 P 3 18 (SUTURE) IMPLANT
SUT CHROMIC 4 0 RB 1X27 (SUTURE) ×2 IMPLANT
SUT CHROMIC 5 0 P 3 (SUTURE) ×3 IMPLANT
SUT ETHILON 3 0 PS 1 (SUTURE) ×1 IMPLANT
SUT ETHILON 4 0 CL P 3 (SUTURE) IMPLANT
SUT ETHILON 5 0 PS 2 18 (SUTURE) ×2 IMPLANT
SUT ETHILON 6 0 P 1 (SUTURE) IMPLANT
SUT ETHILON 6 0 PS 3 18 (SUTURE) ×1 IMPLANT
SUT MNCRL 6-0 UNDY P1 1X18 (SUTURE) IMPLANT
SUT MNCRL AB 4-0 PS2 18 (SUTURE) IMPLANT
SUT MON AB 4-0 PC3 18 (SUTURE) ×2 IMPLANT
SUT MON AB 5-0 P3 18 (SUTURE) IMPLANT
SUT NYLON ETHILON 5-0 P-3 1X18 (SUTURE) IMPLANT
SUT PDS AB 4-0 P3 18 (SUTURE) IMPLANT
SUT PDS II 5-0 VIOLET 1X18 TF (SUTURE) IMPLANT
SUT PLAIN 6 0 TG1408 (SUTURE) IMPLANT
SUT PLAIN GUT FAST 5-0 (SUTURE) IMPLANT
SUT PROLENE 5 0 P 3 (SUTURE) ×1 IMPLANT
SUT SILK 3 0 PS 1 (SUTURE) IMPLANT
SUT SILK 3 0 SH 30 (SUTURE) ×1 IMPLANT
SUT SILK 3 0 SH CR/8 (SUTURE) IMPLANT
SUT SILK 4 0 PS 2 (SUTURE) IMPLANT
SUT VIC AB 3-0 PS2 18XBRD (SUTURE) IMPLANT
SUT VIC AB 3-0 SH 27X BRD (SUTURE) ×4 IMPLANT
SUT VIC AB 3-0 X1 27 (SUTURE) IMPLANT
SUT VIC AB 4-0 PS2 18 (SUTURE) IMPLANT
SUT VIC AB 4-0 PS2 27 (SUTURE) IMPLANT
SUT VICRYL 3-0 CR8 SH (SUTURE) ×1 IMPLANT
SUT VICRYL RAPID 5 0 P 3 (SUTURE) IMPLANT
SUT VICRYL RAPIDE 4-0 (SUTURE) IMPLANT
SYR 10ML LL (SYRINGE) ×3 IMPLANT
SYR 3ML 23GX1 SAFETY (SYRINGE) ×2 IMPLANT
SYR BULB EAR ULCER 3OZ GRN STR (SYRINGE) ×1 IMPLANT
SYR CONTROL 10ML LL (SYRINGE) ×2 IMPLANT
SYR TB 1ML LL NO SAFETY (SYRINGE) IMPLANT
TAPE PAPER 1/2X10 TAN MEDIPORE (MISCELLANEOUS) IMPLANT
TAPE UMBILICAL 1/8 X36 TWILL (MISCELLANEOUS) IMPLANT
TOWEL GREEN STERILE FF (TOWEL DISPOSABLE) ×6 IMPLANT
TRAY DSU PREP LF (CUSTOM PROCEDURE TRAY) ×3 IMPLANT
TUBE CONNECTING 20X1/4 (TUBING) ×3 IMPLANT
TUBE SALEM SUMP 16F (TUBING) IMPLANT
UNDERPAD 30X36 HEAVY ABSORB (UNDERPADS AND DIAPERS) ×3 IMPLANT
YANKAUER SUCT BULB TIP NO VENT (SUCTIONS) IMPLANT

## 2023-06-24 NOTE — Anesthesia Procedure Notes (Signed)
Procedure Name: Intubation Date/Time: 06/24/2023 12:07 PM  Performed by: Roosvelt Harps, CRNAPre-anesthesia Checklist: Patient identified, Emergency Drugs available, Suction available and Patient being monitored Patient Re-evaluated:Patient Re-evaluated prior to induction Oxygen Delivery Method: Circle System Utilized Preoxygenation: Pre-oxygenation with 100% oxygen Induction Type: IV induction Ventilation: Mask ventilation without difficulty Laryngoscope Size: Mac and 3 Tube type: Oral Tube size: 7.0 mm Number of attempts: 1 Airway Equipment and Method: Stylet and Oral airway Placement Confirmation: ETT inserted through vocal cords under direct vision, positive ETCO2 and breath sounds checked- equal and bilateral Secured at: 21 cm Tube secured with: Tape Dental Injury: Teeth and Oropharynx as per pre-operative assessment

## 2023-06-24 NOTE — Anesthesia Postprocedure Evaluation (Signed)
Anesthesia Post Note  Patient: Dawn Meyer  Procedure(s) Performed: EXCISION AND REPAIR OF RIGHT CHEEK MOHS DEFECT (Right: Face) ADJACENT TISSUE TRANSFER/TISSUE REARRANGEMENT (Right: Neck)     Patient location during evaluation: PACU Anesthesia Type: General Level of consciousness: awake and alert and oriented Pain management: pain level controlled Vital Signs Assessment: post-procedure vital signs reviewed and stable Respiratory status: spontaneous breathing, nonlabored ventilation and respiratory function stable Cardiovascular status: blood pressure returned to baseline and stable Postop Assessment: no apparent nausea or vomiting Anesthetic complications: no   No notable events documented.  Last Vitals:  Vitals:   06/24/23 1530 06/24/23 1545  BP: 129/70 116/68  Pulse: 81 72  Resp: 11 11  Temp:    SpO2: 96% 97%    Last Pain:  Vitals:   06/24/23 1545  TempSrc:   PainSc: 0-No pain                 Shaneika Rossa A.

## 2023-06-24 NOTE — Discharge Instructions (Signed)
Post-operative Patient Instructions Dawn Meyer. Hoshal MD  Surgery What to expect: A bandage will be placed on your surgical sites. You can leave the bandages in place until you return to the clinic. You may be scheduled for a series of wound care appointments over the next month.  Recovery/Restrictions: -No strenuous activity for at least the first week after your procedure -Bruising and swelling are expected and will take weeks to go away (consider using ice packs) -Please contact our office immediately if you experience any signs/symptoms of infection (redness, pain, or fever of 100.83F or greater)  Wound Wound care: The goal is to keep your wounds clean and moist to prevent scabs or crusts. You will keep your current post-operative dressing in place undisturbed until after your first post-operative clinic visit. The following instructions apply after your first post-operative visit.  1. Clean wound wounds with soap and water using a cue tip if any crusts are present  2. Next, apply a thin layer of Aquaphor ointment 3. Apply Telfa and cover with brown tape  4. If you had an ear surgery - please apply a thin layer of antibiotic ointment or Aquaphor ointment to your ear incision every day  Care Healing Period: For the best healing, please protect the area from the sun   You may be asked to begin massaging the scars several weeks after surgery. Scars can be massaged in horizontal, vertical, and circular motions.   Adult Post-Operative Pain Management  Pain medication is given immediately following your surgery to help with post-operative pain. Do not wake up or set an alarm to wake up and take pain medications. Sleep and rest.  Upon your discharge home, we suggest scheduled doses of Acetaminophen (Tylenol) every 6 hours and Ibuprofen (Motrin) every 6 hours, alternating between medications every 3 hours (i.e. Take Tylenol and wait 3 hours, then take Motrin and wait 3 hours, repeat) for the  first 3-4 days after surgery. If you are without significant pain, medications can be taken more infrequently. It is important to follow dosing instructions on the medication bottle or prescription.   Sample of medication dosing schedule  Give dose of: Time: Given:  Acetaminophen 12 a.m.   Ibuprofen 3 a.m.   Acetaminophen 6 a.m.   Ibuprofen 9 a.m.   Acetaminophen 12 p.m.   Ibuprofen 3 p.m.   Acetaminophen 6 p.m.   Ibuprofen 9 p.m.    If you need to call after clinic hours for a concern, call [phone] and ask for the "physician on call for ENT."  [address] [place], [place] [address] Phone: [phone]     Post Anesthesia Home Care Instructions  Activity: Get plenty of rest for the remainder of the day. A responsible individual must stay with you for 24 hours following the procedure.  For the next 24 hours, DO NOT: -Drive a car -Advertising copywriter -Drink alcoholic beverages -Take any medication unless instructed by your physician -Make any legal decisions or sign important papers.  Meals: Start with liquid foods such as gelatin or soup. Progress to regular foods as tolerated. Avoid greasy, spicy, heavy foods. If nausea and/or vomiting occur, drink only clear liquids until the nausea and/or vomiting subsides. Call your physician if vomiting continues.  Special Instructions/Symptoms: Your throat may feel dry or sore from the anesthesia or the breathing tube placed in your throat during surgery. If this causes discomfort, gargle with warm salt water. The discomfort should disappear within 24 hours.  If you had  a scopolamine patch placed behind your ear for the management of post- operative nausea and/or vomiting:  1. The medication in the patch is effective for 72 hours, after which it should be removed.  Wrap patch in a tissue and discard in the trash. Wash hands thoroughly with soap and water. 2. You may remove the patch earlier than 72 hours if you experience  unpleasant side effects which may include dry mouth, dizziness or visual disturbances. 3. Avoid touching the patch. Wash your hands with soap and water after contact with the patch.     About my Jackson-Pratt Bulb Drain  What is a Jackson-Pratt bulb? A Jackson-Pratt is a soft, round device used to collect drainage. It is connected to a long, thin drainage catheter, which is held in place by one or two small stiches near your surgical incision site. When the bulb is squeezed, it forms a vacuum, forcing the drainage to empty into the bulb.  Emptying the Jackson-Pratt bulb- To empty the bulb: 1. Release the plug on the top of the bulb. 2. Pour the bulb's contents into a measuring container which your nurse will provide. 3. Record the time emptied and amount of drainage. Empty the drain(s) as often as your     doctor or nurse recommends.  Date                  Time                    Amount (Drain 1)                 Amount (Drain 2)  _____________________________________________________________________  _____________________________________________________________________  _____________________________________________________________________  _____________________________________________________________________  _____________________________________________________________________  _____________________________________________________________________  _____________________________________________________________________  _____________________________________________________________________  Squeezing the Jackson-Pratt Bulb- To squeeze the bulb: 1. Make sure the plug at the top of the bulb is open. 2. Squeeze the bulb tightly in your fist. You will hear air squeezing from the bulb. 3. Replace the plug while the bulb is squeezed. 4. Use a safety pin to attach the bulb to your clothing. This will keep the catheter from     pulling at the bulb insertion site.  When to call your  doctor- Call your doctor if: Drain site becomes red, swollen or hot. You have a fever greater than 101 degrees F. There is oozing at the drain site. Drain falls out (apply a guaze bandage over the drain hole and secure it with tape). Drainage increases daily not related to activity patterns. (You will usually have more drainage when you are active than when you are resting.) Drainage has a bad odor.   Last received tylenol at 0930am

## 2023-06-24 NOTE — Transfer of Care (Signed)
Immediate Anesthesia Transfer of Care Note  Patient: Dawn Meyer  Procedure(s) Performed: EXCISION AND REPAIR OF RIGHT CHEEK MOHS DEFECT (Right: Face) ADJACENT TISSUE TRANSFER/TISSUE REARRANGEMENT (Right: Neck)  Patient Location: PACU  Anesthesia Type:General  Level of Consciousness: drowsy  Airway & Oxygen Therapy: Patient Spontanous Breathing and Patient connected to face mask oxygen  Post-op Assessment: Report given to RN and Post -op Vital signs reviewed and stable  Post vital signs: Reviewed and stable  Last Vitals:  Vitals Value Taken Time  BP 126/67 06/24/23 1515  Temp    Pulse 80 06/24/23 1517  Resp 8 06/24/23 1517  SpO2 98 % 06/24/23 1517  Vitals shown include unfiled device data.  Last Pain:  Vitals:   06/24/23 0952  TempSrc: Temporal  PainSc: 5       Patients Stated Pain Goal: 3 (06/24/23 1610)  Complications: No notable events documented.

## 2023-06-24 NOTE — Op Note (Signed)
FACIAL PLASTIC SURGERY OPERATIVE NOTE  BRITTONY BILLICK Date/Time of Admission: 06/24/2023  9:29 AM  CSN: [id number];UXL:[id number] Attending Provider: Scarlette Ar, MD Room/Bed: [facility]/NONE DOB: 1955/11/22 Age: 68 y.o.   Pre-Op Diagnosis: Basosquamous carcinoma of right cheek; Acquired deformity of face  Post-Op Diagnosis: Basosquamous carcinoma of right cheek; Acquired deformity of face  Procedure:  Adjacent tissue transfer right face (cheek) with cervicofacial rotational advancement flap 10x15cm (CPT 14061) Excision wound head <100cm2 in preparation for flap (CPT 15004) Placement of myriad synthetic graft into parotid wound defect 2 x 2 cm (CPT 15275)  Anesthesia: General  Surgeon(s): Mervin Kung, MD  Staff: Circulator: Lenn Cal, RN Scrub Person: Rolla Etienne Vendor Representative : Virgina Evener  Implants: Implant Name Type Inv. Item Serial No. Manufacturer Lot No. LRB No. Used Action  PWDR [employer] FINE 500 - [id number] Miscellaneous PWDR [employer] FINE 500  [employer] [id number] Right 1 Implanted    Specimens: * No specimens in log *  Complications: none  EBL: 50 ML  IVF: Per anesthesia ML  Condition: stable  Operative Findings:  Large facial defect ~6x5cm with depth to zygomatic bone, deep parotid gland with resection of upper division of facial nerve; distal buccal branch identified and tagged with 5-0 prolene, proximal stump not identified. Pre-operative exam notable for completely paralysis of right facial nerve sparing the lower marginal and cervical branch. Anterior ear canal resected.   Haiku Photo:    Indications for Procedure: 68 year old female with history of extensive sun damage, tobacco abuse, rheumatoid arthritis who presents with a 1 year history of right cheek basal squamous cell carcinoma.  She underwent extensive Mohs resection with Dr. Jeannine Boga yesterday and presents today for definitive  surgical management.  I discussed reconstructive goals today which primarily involve repairing the complex deep fascial defect.  I discussed the use of a cervicofacial rotational advancement flap with possible synthetic grafting as well.  I discussed that if eyelid movement does not improve over the next several weeks and months she may benefit from upper eyelid platinum weight with tarsal strip canthoplasty to improve her eyelid closure.  In addition she may benefit from referral to a facial nerve center for facial reanimation surgery in the future pending the need for adjuvant radiation in the weeks ahead.  Discussed RBA of surgery in detail.  Patient amenable to proceed.  Description of Operation:  The patient was identified in the preoperative area and consent was confirmed in the chart.  She was brought to the operating room by the anesthetist and a preoperative huddle was performed confirming the patient's identity and procedure to be performed.  Once all were in agreement we proceeded with surgery.  General anesthesia was induced and the patient was intubated with an oral tube.  The head was turned to the left exposing the right neck with the findings as noted above.  The patient's wounds dressings were removed and the right face and cheek area was anesthetized with lidocaine mixed with epinephrine.  Next a large cervicofacial rotational advancement flap approximately 10 x 15 cm was marked out and a postauricular incision extending down into the lower one third of the neck into a transverse skin crease.  This was also infiltrated with local anesthetic.  The patient was then prepped and draped in standard sterile fashion for a procedure of this kind.  A final preoperative pause was performed and we proceeded with surgery.  We began with excision wound head less than 100 cm  in preparation for flap.  This circumferential wound edges were dissected in a sub-SMAS plane using Metzenbaum scissors.  This was  performed circumferentially about 2 cm.  Bleeding was controlled with judicious use of bipolar cautery.  The wound was carefully investigated demonstrating a very deep apex of the defect towards the facial nerve origin.  A facial nerve monitor was brought in the field and paralysis reversed.  A large distal buccal branch to the midface was identified and stimulated and tagged with a 5-0 Prolene suture.  I then carefully dissected in the region of the anticipated facial nerve origin at the tympano mastoid suture line.  Despite multiple attempts, the proximal stump could not be identified.  Given the risk of needing adjuvant radiation therapy due to the adverse pathology, I deferred any further attempts at cable grafting of the facial nerve.  I will consider referring the patient to a facial nerve reanimation center at [facility] after recovery from reconstruction stage I and (if) adjuvant radiation therapy is completed.  I then proceeded with placement of synthetic grafting into the deep parotid wound bed.  There was a deep defect here towards the skull base.  Myriad powder matrix was then brought into the field and gently hydrated and packed into the area.  The defect area here was about 2 x 2 cm.  I then proceeded with a large right sided cervicofacial rotational advancement flap.  Starting in the face a sub-SMAS plane was dissected above the parotid capsule fascia.  This was then carried in the neck.  The neck incision was secured with a 15 blade and a subplatysmal plane was dissected and connected to the sub-SMAS plane this was carried extensively over the face and midline neck.  Care was taken to avoid the external jugular veins and anterior jugular veins.  The marginal mandibular nerve was left intact.  After adequate release of the 10 x 15 cm sub-SMAS/subplatysmal cervical facial rotational advancement flap the flap was then inset superiorly to the temporal scalp defect with  buried interrupted 3-0 Vicryl sutures.  This was then gently inset along the anterior canal with interrupted 4-0 Chromic Gut sutures doing my best to preserve ear canal patency.  I then continued with buried interrupted 3-0 Vicryl platysmal and deep layer sutures below the lobule and into the neck.  A 15 [place] JP drain was secured in the neck with a 3-0 silk suture.  Next the skin was closed with running 6-0 nylon in the right cheek area, interrupted 5-0 nylon along the ear and infra lobular area, and running 5-0 nylon in the neck.  The wounds were cleansed and dressed with bacitracin, Telfa and brown paper tape.  There was good vascularity of the flap at the end of the procedure.  The patient was then turned back to anesthesia who extubated her and brought her to the recovery room in stable condition.  All counts were correct and final.  Mervin Kung, MD [facility]  06/24/2023

## 2023-06-24 NOTE — Anesthesia Preprocedure Evaluation (Addendum)
Anesthesia Evaluation  Patient identified by MRN, date of birth, ID band Patient awake    Reviewed: Allergy & Precautions, NPO status , Patient's Chart, lab work & pertinent test results  History of Anesthesia Complications Negative for: history of anesthetic complications  Airway Mallampati: IV  TM Distance: >3 FB Neck ROM: Full  Mouth opening: Limited Mouth Opening Comment: Patient reports mouth opening limited due to pain from cheek. Dental  (+) Partial Upper, Partial Lower, Dental Advisory Given   Pulmonary neg shortness of breath, asthma , neg sleep apnea, COPD (used Trelegy this morning; has not needed rescue inhaler recently),  COPD inhaler, neg recent URI, Current Smoker and Patient abstained from smoking.   Pulmonary exam normal breath sounds clear to auscultation       Cardiovascular hypertension (amlodipine), Pt. on medications (-) angina (-) Past MI, (-) Cardiac Stents and (-) CABG (-) dysrhythmias  Rhythm:Regular Rate:Normal  HLD   Neuro/Psych negative neurological ROS     GI/Hepatic Neg liver ROS,GERD  ,,  Endo/Other  negative endocrine ROS    Renal/GU negative Renal ROS     Musculoskeletal  (+) Arthritis , Rheumatoid disorders,    Abdominal   Peds  Hematology negative hematology ROS (+) Lab Results      Component                Value               Date                      WBC                      8.0                 04/10/2023                HGB                      13.4                04/10/2023                HCT                      40.3                04/10/2023                MCV                      93                  04/10/2023                PLT                      179                 04/10/2023              Anesthesia Other Findings Skin cancers around mouth  Reproductive/Obstetrics                             Anesthesia Physical Anesthesia Plan  ASA:  2  Anesthesia Plan: General   Post-op Pain Management: Tylenol PO (pre-op)*  Induction: Intravenous  PONV Risk Score and Plan: 2 and Ondansetron, Dexamethasone and Treatment may vary due to age or medical condition  Airway Management Planned: Oral ETT and Video Laryngoscope Planned  Additional Equipment:   Intra-op Plan:   Post-operative Plan: Extubation in OR  Informed Consent: I have reviewed the patients History and Physical, chart, labs and discussed the procedure including the risks, benefits and alternatives for the proposed anesthesia with the patient or authorized representative who has indicated his/her understanding and acceptance.     Dental advisory given  Plan Discussed with: CRNA and Anesthesiologist  Anesthesia Plan Comments: (Risks of general anesthesia discussed including, but not limited to, sore throat, hoarse voice, chipped/damaged teeth, injury to vocal cords, nausea and vomiting, allergic reactions, lung infection, heart attack, stroke, and death. All questions answered. )        Anesthesia Quick Evaluation

## 2023-06-24 NOTE — H&P (Addendum)
Dawn Meyer is an 68 y.o. female.    Chief Complaint:  Acquired deformity of the right face after mohs surgery for basoquamous cell carcinoma  HPI: Patient presents today for planned elective procedure.  He/she denies any interval change in history since office visit on 06/16/23.   Past Medical History:  Diagnosis Date   Asthma    COPD (chronic obstructive pulmonary disease) (HCC)    GERD (gastroesophageal reflux disease)    Hypertension    Prolapsed uterus    per patient, dx by OB-GYN   RA (rheumatoid arthritis) (HCC)    Smoker    1/4/ppd    Past Surgical History:  Procedure Laterality Date   COLONOSCOPY WITH PROPOFOL N/A 08/09/2021   Procedure: COLONOSCOPY WITH PROPOFOL;  Surgeon: Corbin Ade, MD;  Location: [facility];  Service: Endoscopy;  Laterality: N/A;  7:30am   [place] procedure  04/28/2022   for prolapsed cervix   MOHS SURGERY     SQUAMOUS CELL CARCINOMA EXCISION     VAGINAL DELIVERY      Family History  Problem Relation Age of Onset   Congestive Heart Failure Mother    Heart attack Mother    Diabetes Father    Heart Problems Father        pacemaker   Diverticulitis Sister    Hypertension Sister    Heart attack Brother    Healthy Daughter    Colon cancer Neg Hx     Social History:  reports that she has been smoking cigarettes. She has a 6 pack-year smoking history. She has never been exposed to tobacco smoke. She has never used smokeless tobacco. She reports that she does not drink alcohol and does not use drugs.  Allergies:  Allergies  Allergen Reactions   Doxycycline Rash    Medications Prior to Admission  Medication Sig Dispense Refill   Acetaminophen (TYLENOL ARTHRITIS PAIN PO) Take by mouth as needed.     albuterol (VENTOLIN HFA) 108 (90 Base) MCG/ACT inhaler INHALE TWO PUFFS INTO THE LUNGS EVERY SIX HOURS AS NEEDED FOR WHEEZING OR SHORTNESS OF BREATH 18 g 3   amLODipine (NORVASC) 10 MG tablet Take 1 tablet (10 mg total) by mouth  daily. 90 tablet 3   Fluticasone-Umeclidin-Vilant (TRELEGY ELLIPTA) 100-62.5-25 MCG/ACT AEPB Inhale 1 puff into the lungs daily. 1 each 11   NIACINAMIDE PO Take 1 tablet by mouth daily.     adalimumab (HUMIRA, 2 PEN,) 40 MG/0.4ML pen Inject 0.4 mLs (40 mg total) into the skin every 14 (fourteen) days. 1 kit - 2 pens 6 each 0   Polypodium Leucotomos (HELIOCARE PO) Take 1 tablet by mouth daily as needed (sun exposure).      No results found for this or any previous visit (from the past 48 hours). No results found.  ROS: negative other than stated in HPI  Blood pressure 125/75, pulse 70, temperature 98.3 F (36.8 C), temperature source Temporal, resp. rate 16, height 5\' 6"  (1.676 m), weight 69.4 kg, SpO2 100%.  PHYSICAL EXAM: General: Resting comfortably in NAD  Face: Large bandage over right face. There is near total facial paralysis (Temporal, zygomatic, buccal) are not moving; marginal mandibular appears intact on the right. Patient with incomplete eye closure.   Lungs: Non-labored respiratinos  Studies Reviewed: None   Assessment/Plan Basosquamous cell carcinoma Acquired deformity of the right face Facial paralysis, Right Paralytic lagopthalmos  Proceed with mohs repair right face with adjacent tissue transfer, possible skin graft from ear or neck,  possible synthetic graft placement. Informed consent obtained. RBA discussed.   Electronically signed by:  Scarlette Ar, MD  Staff Physician Facial Plastic & Reconstructive Surgery Otolaryngology - Head and Neck Surgery [facility]  06/24/2023, 11:28 AM

## 2023-06-25 ENCOUNTER — Encounter (HOSPITAL_BASED_OUTPATIENT_CLINIC_OR_DEPARTMENT_OTHER): Payer: Self-pay | Admitting: Otolaryngology

## 2023-06-25 DIAGNOSIS — C44319 Basal cell carcinoma of skin of other parts of face: Secondary | ICD-10-CM | POA: Diagnosis not present

## 2023-06-25 MED ORDER — CIPROFLOXACIN-DEXAMETHASONE 0.3-0.1 % OT SUSP: 4.0000 [drp] | Freq: Two times a day (BID) | OTIC | Status: AC

## 2023-06-25 MED ORDER — OFLOXACIN 0.3 % OP SOLN: 4.0000 [drp] | Freq: Two times a day (BID) | OPHTHALMIC | 0 refills | Status: DC

## 2023-06-25 NOTE — Discharge Summary (Signed)
Physician Discharge Summary  Patient ID: Dawn Meyer MRN: [id number] DOB/AGE: May 25, 1956 68 y.o.  Admit date: 06/24/2023 Discharge date: 06/25/2023  Admission Diagnoses:  Principal Problem:   Acquired facial deformity   Discharge Diagnoses:  Same  Surgeries: Procedure(s): EXCISION AND REPAIR OF RIGHT CHEEK MOHS DEFECT ADJACENT TISSUE TRANSFER/TISSUE REARRANGEMENT on 06/24/2023   Discharged Condition: Improved  Hospital Course: Dawn Meyer is an 68 y.o. female who was admitted 06/24/2023 with a chief complaint of No chief complaint on file. , and found to have a diagnosis of Acquired facial deformity.  They were brought to the operating room on 06/24/2023 and underwent the above named procedures.    Physical Exam:  General: Awake and alert, no acute distress Neck: Incision intact with bandages. JP drain holding bulb suction with s/s output.  Neuro: Pre-operative facial nerve paralysis is stable.  Respiratory: Respiratory effort is normal. Lungs clear to auscultation.  Recent vital signs:  Vitals:   06/25/23 0900 06/25/23 1200  BP: 125/71 131/62  Pulse: (!) 55 (!) 52  Resp: 16 16  Temp: 98.6 F (37 C) 98.1 F (36.7 C)  SpO2: 99% 98%    Recent laboratory studies:  Results for orders placed or performed in visit on 04/08/23  CMP14+EGFR   Collection Time: 04/10/23  9:49 AM  Result Value Ref Range   Glucose 99 70 - 99 mg/dL   BUN 10 8 - 27 mg/dL   Creatinine, Ser 3.32 0.57 - 1.00 mg/dL   eGFR 64 >95 JO/ACZ/6.60   BUN/Creatinine Ratio 10 (L) 12 - 28   Sodium 142 134 - 144 mmol/L   Potassium 3.9 3.5 - 5.2 mmol/L   Chloride 107 (H) 96 - 106 mmol/L   CO2 21 20 - 29 mmol/L   Calcium 9.3 8.7 - 10.3 mg/dL   Total Protein 6.4 6.0 - 8.5 g/dL   Albumin 4.0 3.9 - 4.9 g/dL   Globulin, Total 2.4 1.5 - 4.5 g/dL   Bilirubin Total 0.4 0.0 - 1.2 mg/dL   Alkaline Phosphatase 122 (H) 44 - 121 IU/L   AST 16 0 - 40 IU/L   ALT 11 0 - 32 IU/L  CBC with  Differential/Platelet   Collection Time: 04/10/23  9:49 AM  Result Value Ref Range   WBC 8.0 3.4 - 10.8 x10E3/uL   RBC 4.32 3.77 - 5.28 x10E6/uL   Hemoglobin 13.4 11.1 - 15.9 g/dL   Hematocrit 63.0 16.0 - 46.6 %   MCV 93 79 - 97 fL   MCH 31.0 26.6 - 33.0 pg   MCHC 33.3 31.5 - 35.7 g/dL   RDW 10.9 32.3 - 55.7 %   Platelets 179 150 - 450 x10E3/uL   Neutrophils 48 Not Estab. %   Lymphs 33 Not Estab. %   Monocytes 8 Not Estab. %   Eos 10 Not Estab. %   Basos 1 Not Estab. %   Neutrophils Absolute 3.9 1.4 - 7.0 x10E3/uL   Lymphocytes Absolute 2.6 0.7 - 3.1 x10E3/uL   Monocytes Absolute 0.6 0.1 - 0.9 x10E3/uL   EOS (ABSOLUTE) 0.8 (H) 0.0 - 0.4 x10E3/uL   Basophils Absolute 0.1 0.0 - 0.2 x10E3/uL   Immature Granulocytes 0 Not Estab. %   Immature Grans (Abs) 0.0 0.0 - 0.1 x10E3/uL    Discharge Medications:   Allergies as of 06/25/2023       Reactions   Doxycycline Rash        Medication List     TAKE these medications  albuterol 108 (90 Base) MCG/ACT inhaler Commonly known as: VENTOLIN HFA INHALE TWO PUFFS INTO THE LUNGS EVERY SIX HOURS AS NEEDED FOR WHEEZING OR SHORTNESS OF BREATH   amLODipine 10 MG tablet Commonly known as: NORVASC Take 1 tablet (10 mg total) by mouth daily.   ciprofloxacin-dexamethasone OTIC suspension Commonly known as: CIPRODEX Place 4 drops into the right ear 2 (two) times daily for 7 days.   HELIOCARE PO Take 1 tablet by mouth daily as needed (sun exposure).   Humira (2 Pen) 40 MG/0.4ML pen Generic drug: adalimumab Inject 0.4 mLs (40 mg total) into the skin every 14 (fourteen) days. 1 kit - 2 pens   NIACINAMIDE PO Take 1 tablet by mouth daily.   oxyCODONE 5 MG immediate release tablet Commonly known as: Roxicodone Take 1 tablet (5 mg total) by mouth every 4 (four) hours as needed for up to 5 days for severe pain (pain score 7-10) or breakthrough pain.   Trelegy Ellipta 100-62.5-25 MCG/ACT Aepb Generic drug:  Fluticasone-Umeclidin-Vilant Inhale 1 puff into the lungs daily.   TYLENOL ARTHRITIS PAIN PO Take by mouth as needed.        Diagnostic Studies: CT SOFT TISSUE NECK W CONTRAST Result Date: 06/12/2023 CLINICAL DATA:  Basal squamous carcinoma of the scan. Acquired deformity of the face. EXAM: CT NECK WITH CONTRAST TECHNIQUE: Multidetector CT imaging of the neck was performed using the standard protocol following the bolus administration of intravenous contrast. RADIATION DOSE REDUCTION: This exam was performed according to the departmental dose-optimization program which includes automated exposure control, adjustment of the mA and/or kV according to patient size and/or use of iterative reconstruction technique. CONTRAST:  75mL OMNIPAQUE IOHEXOL 300 MG/ML  SOLN COMPARISON:  None Available. FINDINGS: Pharynx and larynx: No mucosal or submucosal lesion or inflammatory change. Salivary glands: Left parotid gland is normal. Right parotid gland appears largely normal, but the superior aspect of the superficial lobe is in direct contiguity with the a skin and subcutaneous lesion measuring 2 cm in size presumed to represent the described malignancy. Direct invasion of the superior aspect of the parotid gland is not excluded. Submandibular glands are normal. Thyroid: Mildly heterogeneous. No abnormality to suggest need for follow-up ultrasound. Lymph nodes: No lymphadenopathy on the left. No definite lymphadenopathy on the right. Right level two/level three lymph nodes more prominent than on the left, but none definitely exceed size criteria. The largest node is a level 3 node on the right measuring 17 x 9 x 6 mm. No low-density or hyperenhancing nodes. Vascular: Atherosclerotic calcification at both carotid bifurcations. No flow limiting stenosis suspected. Detailed vascular study was not performed. Limited intracranial: Normal Visualized orbits: Normal Mastoids and visualized paranasal sinuses: Mild inflammatory  changes of the paranasal sinus mucosa. No advanced finding. Skeleton: Ordinary cervical spondylosis. Upper chest: Emphysema and scarring in the upper lobes. No evidence of nodule or mass. Other: None IMPRESSION: 1. 2 cm skin and subcutaneous lesion in the right face presumed to represent the described malignancy. This is located in the preauricular region on the right. The superior aspect of the superficial lobe of the right parotid gland is in direct contiguity with the lesion. Direct invasion of the superior aspect of the parotid gland is not excluded. 2. No definite lymphadenopathy. Right level two/level three lymph nodes more prominent than on the left, but none definitely exceed size criteria. The largest node is a level 3 node on the right measuring 17 x 9 x 6 mm. No low-density or hyperenhancing nodes.  3. Atherosclerotic calcification at both carotid bifurcations. No flow limiting stenosis suspected. Detailed vascular study was not performed. 4. Emphysema and scarring in the upper lobes. Emphysema (ICD10-J43.9). Electronically Signed   By: Paulina Fusi M.D.   On: 06/12/2023 18:54    Disposition: Discharge disposition: 01-Home or Self Care       Discharge Instructions     Discharge patient   Complete by: As directed    DC after seen by Dr. Ernestene Kiel ~1200   Discharge disposition: 01-Home or Self Care   Discharge patient date: 06/25/2023          Signed: Scarlette Ar 06/25/2023, 12:49 PM

## 2023-06-30 DIAGNOSIS — C44329 Squamous cell carcinoma of skin of other parts of face: Secondary | ICD-10-CM | POA: Diagnosis not present

## 2023-07-03 ENCOUNTER — Telehealth: Payer: Self-pay | Admitting: Radiation Oncology

## 2023-07-03 NOTE — Telephone Encounter (Signed)
 Called patient to schedule a consultation w. Dr. Izell. Patient declined consultation at this time. Patient stated she has a follow up w. Dr. Luciano scheduled 07/10/23 for removal of stitches and a reconstructive surgery of the face scheduled for 07/13/23, prefers to schedule consultation after healing from surgery. Advised patient to request Dr. Vella office to send our office a referral once she's ready to schedule. Closing referral at this time.

## 2023-07-13 DIAGNOSIS — H0223A Paralytic lagophthalmos right eye, upper and lower eyelids: Secondary | ICD-10-CM | POA: Diagnosis not present

## 2023-07-13 DIAGNOSIS — H02152 Paralytic ectropion of right lower eyelid: Secondary | ICD-10-CM | POA: Diagnosis not present

## 2023-07-13 DIAGNOSIS — G510 Bell's palsy: Secondary | ICD-10-CM | POA: Diagnosis not present

## 2023-07-16 NOTE — Progress Notes (Signed)
 Histology and Location of Primary Skin Cancer:  Primary Basosquamous Carcinoma of Skin on The Right Cheek  Dr. Ernestene Kiel 06/04/2023 Charlett Lango presented 12 months ago with signs and symptoms:  She reports sensitivity to the touch. She reports no numbness on the side of her head or face, facial weakness, or droop. She tends to chew more on the right side than the left. She has no lumps or bumps in her neck.    CT Soft Tissue Neck W Contrast     Past/Anticipated interventions by patient's surgeon/dermatologist for current problematic lesion, if any:  Assessment & Plan Scarlette Ar 06/04/2023 1. Basosquamous carcinoma of the right cheek. The lesion has been present for approximately 6 to 12 months. It is likely due to sun damage. The longer it remains untreated, the higher the risk of it penetrating deeper or metastasizing to the lymph nodes. She is scheduled for excision on 06/23/2023 with Dr. Jeannine Boga, The following day, she will be seen at [facility] for repair of the facial defect using an adjacent tissue transfer technique. The potential risks associated with surgery, including pain, bleeding, infection, scarring, numbness, poor cosmetic outcome, need for revision surgery, and risks of anesthesia, were thoroughly discussed. The possibility of facial paralysis was also addressed. She was reassured that the infection risk is low despite being on Humira.   A CT scan of the neck with contrast will be ordered to evaluate for any lymph node involvement. The procedure and its implications were explained in detail. She was advised to complete the CT scan at least a few days prior to the scheduled surgery. If the CT scan reveals deep involvement in the parotid gland or neck, a more extensive surgery, potentially involving a parotidectomy, may be necessary.  Patient is scheduled for Mohs reconstruction right cheek with me on on June 24, 2023. Will call patient once CT neck is completed to  evaluate depth of the lesion and lymph node basin. Hopefully this will not demonstrate the role for more invasive surgery.    Past skin cancers, if any: 1) Location/Histology/Intervention: Basosquamous Carcinoma of the Right Cheek, previously treated with Cryotherapy  History of Blistering sunburns: Patient has been exposed to the sun and has gotten sunburned   SAFETY ISSUES: Prior radiation? None Pacemaker/ICD? None Possible current pregnancy? N/A Is the patient on methotrexate? None  Current Complaints / other details:   Patients affected side of face is red and irritated. Patient and daughter noticed site is now oozing with pus.   BP (!) 165/76 (BP Location: Right Arm, Patient Position: Sitting, Cuff Size: Normal)   Pulse 62   Temp 97.6 F (36.4 C)   Resp 18   Ht 5\' 6"  (1.676 m)   Wt 148 lb (67.1 kg)   SpO2 100%   BMI 23.89 kg/m     Wt Readings from Last 3 Encounters:  07/22/23 148 lb (67.1 kg)  06/24/23 153 lb (69.4 kg)  05/13/23 156 lb (70.8 kg)

## 2023-07-20 DIAGNOSIS — G510 Bell's palsy: Secondary | ICD-10-CM | POA: Diagnosis not present

## 2023-07-20 DIAGNOSIS — D490 Neoplasm of unspecified behavior of digestive system: Secondary | ICD-10-CM | POA: Diagnosis not present

## 2023-07-20 DIAGNOSIS — C4499 Other specified malignant neoplasm of skin, unspecified: Secondary | ICD-10-CM | POA: Diagnosis not present

## 2023-07-20 DIAGNOSIS — Z720 Tobacco use: Secondary | ICD-10-CM | POA: Diagnosis not present

## 2023-07-20 DIAGNOSIS — M069 Rheumatoid arthritis, unspecified: Secondary | ICD-10-CM | POA: Diagnosis not present

## 2023-07-20 DIAGNOSIS — C4402 Squamous cell carcinoma of skin of lip: Secondary | ICD-10-CM | POA: Diagnosis not present

## 2023-07-20 DIAGNOSIS — H0223A Paralytic lagophthalmos right eye, upper and lower eyelids: Secondary | ICD-10-CM | POA: Diagnosis not present

## 2023-07-20 DIAGNOSIS — M952 Other acquired deformity of head: Secondary | ICD-10-CM | POA: Diagnosis not present

## 2023-07-20 NOTE — Progress Notes (Signed)
 Radiation Oncology         [phone] ________________________________  Initial outpatient Consultation  Name: Dawn Meyer MRN: [id number]  Date: 07/22/2023  DOB: 03-31-1956  YQ:[id number], Verdis Frederickson, DO  Scarlette Ar, MD   REFERRING PHYSICIAN: Scarlette Ar, MD  DIAGNOSIS: No diagnosis found. 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   CHIEF COMPLAINT: Here to discuss management of right check skin cancer  HISTORY OF PRESENT ILLNESS::Dawn Meyer is a 68 y.o. female with a history squamous cell skin cancers, which her rheumatologist suspects may be related to her long-term use of Humira injections for rheumatoid arthritis. Patient also has a history of basosquamous carcinoma of the right cheek that was previously treated with cryotherapy. The patient noticed aggressive recurrence of lesion between 6 to 12 months ago after noticing an increased sensitivity to touch.   During her initial consult with Dr. Scarlette Ar on 06-04-23, she denied any numbness, facial weakness, or droop. During visit, they discussed undergoing a repair of the facial defect using an adjacent tissue transfer technique following excision of lesion.     She underwent a CT neck on 06-12-23 to further evaluate diagnosis prior to undergoing surgery. CT showed 2 cm skin and subcutaneous lesion in the right face with superior aspect of the superficial lobe of the right parotid gland is in direct contiguity with the lesion. Direct  invasion of the superior aspect of the parotid gland is not excluded. No definite lymphadenopathy, low-density or hyperenhancing nodes. However, largest level 3 node on the right measures 17 x 9 x 6 mm at the greatest extent.   Subsequently, she underwent a MOHS excision and repair on 06/23/2023 under the care of Dr. Jeannine Boga then underwent a MOHS defect repair on 06-24-23 under the  care of Dr. Ernestene Kiel. 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 She was advised to undergo a repair of ectropion and insertion of upper eyelid platinum weight in then starting adjuvant radiation therapy to the right parotid and neck due to adverse pathology of the primary tumor.    She also underwent ectropion repair of the right eye with a lower eyelid tarsal strip canthoplasty and right upper eyelid platinum weight insertion on February 17 with Dr. Ernestene Kiel. During most recent follow up with Dr. Ernestene Kiel on 07-20-23, patient reported tolerating procedures quite well other than noticing some redness in the procedure's field. She did however express concerns over her lower lip skin cancers (2 nodular lesions) and was requesting a biopsy to further evaluate diagnosis and treatment plan. As such, biopsy of 2 lower lip nodule were preformed (results not finalized yet).      PREVIOUS RADIATION THERAPY: No  PAST MEDICAL HISTORY:  has a past medical history of Asthma, COPD (chronic obstructive pulmonary disease) (HCC), GERD (gastroesophageal reflux disease), Hypertension, Prolapsed uterus, RA (rheumatoid arthritis) (HCC), and Smoker.    PAST SURGICAL HISTORY: Past Surgical History:  Procedure Laterality Date   ADJACENT TISSUE TRANSFER/TISSUE REARRANGEMENT Right 06/24/2023   Procedure: ADJACENT TISSUE TRANSFER/TISSUE REARRANGEMENT;  Surgeon: Scarlette Ar, MD;  Location: [facility];  Service: ENT;  Laterality: Right;   COLONOSCOPY WITH PROPOFOL N/A 08/09/2021   Procedure: COLONOSCOPY WITH PROPOFOL;  Surgeon: Corbin Ade, MD;  [facility];  Service: Endoscopy;  Laterality: N/A;  7:30am   EXCISION  MASS HEAD Right 06/24/2023   Procedure: EXCISION AND REPAIR OF RIGHT CHEEK MOHS DEFECT;  Surgeon: Scarlette Ar, MD;  Location: [facility];  Service: ENT;  Laterality: Right;    [place] procedure  04/28/2022   for prolapsed cervix   MOHS SURGERY     SQUAMOUS CELL CARCINOMA EXCISION     VAGINAL DELIVERY      FAMILY HISTORY: family history includes Congestive Heart Failure in her mother; Diabetes in her father; Diverticulitis in her sister; Healthy in her daughter; Heart Problems in her father; Heart attack in her brother and mother; Hypertension in her sister.  SOCIAL HISTORY:  reports that she has been smoking cigarettes. She has a 6 pack-year smoking history. She has never been exposed to tobacco smoke. She has never used smokeless tobacco. She reports that she does not drink alcohol and does not use drugs.  ALLERGIES: Doxycycline  MEDICATIONS:  Current Outpatient Medications  Medication Sig Dispense Refill   Acetaminophen (TYLENOL ARTHRITIS PAIN PO) Take by mouth as needed.     adalimumab (HUMIRA, 2 PEN,) 40 MG/0.4ML pen Inject 0.4 mLs (40 mg total) into the skin every 14 (fourteen) days. 1 kit - 2 pens 6 each 0   albuterol (VENTOLIN HFA) 108 (90 Base) MCG/ACT inhaler INHALE TWO PUFFS INTO THE LUNGS EVERY SIX HOURS AS NEEDED FOR WHEEZING OR SHORTNESS OF BREATH 18 g 3   amLODipine (NORVASC) 10 MG tablet Take 1 tablet (10 mg total) by mouth daily. 90 tablet 3   Fluticasone-Umeclidin-Vilant (TRELEGY ELLIPTA) 100-62.5-25 MCG/ACT AEPB Inhale 1 puff into the lungs daily. 1 each 11   NIACINAMIDE PO Take 1 tablet by mouth daily.     Polypodium Leucotomos (HELIOCARE PO) Take 1 tablet by mouth daily as needed (sun exposure).     No current facility-administered medications for this encounter.    REVIEW OF SYSTEMS:  Notable for that above.   PHYSICAL EXAM:  vitals were not taken for this visit.   General: Alert and oriented, in no acute distress *** HEENT: Head is normocephalic. Extraocular movements are intact. Oropharynx is clear. Neck: Neck is supple, no palpable cervical or supraclavicular lymphadenopathy. Heart: Regular in rate and rhythm with no murmurs,  rubs, or gallops. Chest: Clear to auscultation bilaterally, with no rhonchi, wheezes, or rales. Abdomen: Soft, nontender, nondistended, with no rigidity or guarding. Extremities: No cyanosis or edema. Lymphatics: see Neck Exam Skin: No concerning lesions. Musculoskeletal: symmetric strength and muscle tone throughout. Neurologic: Cranial nerves II through XII are grossly intact. No obvious focalities. Speech is fluent. Coordination is intact. Psychiatric: Judgment and insight are intact. Affect is appropriate.   ECOG = ***  0 - Asymptomatic (Fully active, able to carry on all predisease activities without restriction)  1 - Symptomatic but completely ambulatory (Restricted in physically strenuous activity but ambulatory and able to carry out work of a light or sedentary nature. For example, light housework, office work)  2 - Symptomatic, <50% in bed during the day (Ambulatory and capable of all self care but unable to carry out any work activities. Up and about more than 50% of waking hours)  3 - Symptomatic, >50% in bed, but not bedbound (Capable of only limited self-care, confined to bed or chair 50% or more of waking hours)  4 - Bedbound (Completely disabled. Cannot carry on any self-care. Totally confined to bed or chair)  5 - Death   Santiago Glad MM, Creech RH, Tormey DC, et al. [phone]). "Toxicity and response criteria of the  [place] Cooperative Oncology Group". Am. Evlyn Clines. Oncol. 5 (6): 649-55   LABORATORY DATA:  Lab Results  Component Value Date   WBC 8.0 04/10/2023   HGB 13.4 04/10/2023   HCT 40.3 04/10/2023   MCV 93 04/10/2023   PLT 179 04/10/2023   CMP     Component Value Date/Time   NA 142 04/10/2023 0949   K 3.9 04/10/2023 0949   CL 107 (H) 04/10/2023 0949   CO2 21 04/10/2023 0949   GLUCOSE 99 04/10/2023 0949   GLUCOSE 73 05/29/2022 1012   BUN 10 04/10/2023 0949   CREATININE 0.98 04/10/2023 0949   CREATININE 0.87 05/29/2022 1012   CALCIUM 9.3 04/10/2023 0949    PROT 6.4 04/10/2023 0949   ALBUMIN 4.0 04/10/2023 0949   AST 16 04/10/2023 0949   ALT 11 04/10/2023 0949   ALKPHOS 122 (H) 04/10/2023 0949   BILITOT 0.4 04/10/2023 0949   EGFR 64 04/10/2023 0949   GFRNONAA 68 03/13/2020 1057   GFRNONAA 63 02/17/2018 1129         RADIOGRAPHY: No results found.    IMPRESSION/PLAN:***    On date of service, in total, I spent *** minutes on this encounter. Patient was seen in person.   __________________________________________   Lonie Peak, MD  This document serves as a record of services personally performed by Lonie Peak, MD. It was created on her behalf by Herbie Saxon, a trained medical scribe. The creation of this record is based on the scribe's personal observations and the provider's statements to them. This document has been checked and approved by the attending provider.

## 2023-07-22 ENCOUNTER — Encounter: Payer: Self-pay | Admitting: Radiation Oncology

## 2023-07-22 ENCOUNTER — Ambulatory Visit
Admission: RE | Admit: 2023-07-22 | Discharge: 2023-07-22 | Disposition: A | Discharge status: Home / Self Care | Payer: Medicare PPO | Source: Ambulatory Visit | Attending: Radiation Oncology | Admitting: Radiation Oncology

## 2023-07-22 ENCOUNTER — Other Ambulatory Visit: Payer: Self-pay

## 2023-07-22 VITALS — BP 165/76 | HR 62 | Temp 97.6°F | Resp 18 | Ht 66.0 in | Wt 148.0 lb

## 2023-07-22 DIAGNOSIS — C770 Secondary and unspecified malignant neoplasm of lymph nodes of head, face and neck: Secondary | ICD-10-CM

## 2023-07-22 DIAGNOSIS — C44320 Squamous cell carcinoma of skin of unspecified parts of face: Secondary | ICD-10-CM | POA: Insufficient documentation

## 2023-07-22 DIAGNOSIS — C44319 Basal cell carcinoma of skin of other parts of face: Secondary | ICD-10-CM | POA: Insufficient documentation

## 2023-07-22 MED ORDER — FLUCONAZOLE 100 MG PO TABS: ORAL_TABLET | ORAL | 0 refills | Status: DC

## 2023-07-22 MED ORDER — CEPHALEXIN 500 MG PO CAPS: 500.0000 mg | ORAL_CAPSULE | Freq: Four times a day (QID) | ORAL | 0 refills | Status: DC

## 2023-07-22 NOTE — Progress Notes (Signed)
 Oncology Nurse Navigator Documentation   Met with patient during initial consult with Dr. Basilio Cairo. She was accompanied by her daughter. I introduced myself as her/their Navigator, explained my role as a member of the Care Team. Assisted with post-consult appt scheduling. They verbalized understanding of information provided. I encouraged them to call with questions/concerns moving forward.  Hedda Slade, RN, BSN, OCN Head & Neck Oncology Nurse Navigator [facility] [place] [phone]

## 2023-07-23 ENCOUNTER — Other Ambulatory Visit: Payer: Self-pay

## 2023-07-23 ENCOUNTER — Other Ambulatory Visit: Payer: Self-pay | Admitting: Otolaryngology

## 2023-07-23 ENCOUNTER — Other Ambulatory Visit: Payer: Self-pay | Admitting: Radiation Oncology

## 2023-07-23 ENCOUNTER — Telehealth: Payer: Self-pay | Admitting: Radiation Oncology

## 2023-07-23 DIAGNOSIS — L570 Actinic keratosis: Secondary | ICD-10-CM | POA: Diagnosis not present

## 2023-07-23 DIAGNOSIS — Z08 Encounter for follow-up examination after completed treatment for malignant neoplasm: Secondary | ICD-10-CM | POA: Diagnosis not present

## 2023-07-23 DIAGNOSIS — Z85828 Personal history of other malignant neoplasm of skin: Secondary | ICD-10-CM | POA: Diagnosis not present

## 2023-07-23 DIAGNOSIS — C44320 Squamous cell carcinoma of skin of unspecified parts of face: Secondary | ICD-10-CM

## 2023-07-23 DIAGNOSIS — X32XXXD Exposure to sunlight, subsequent encounter: Secondary | ICD-10-CM | POA: Diagnosis not present

## 2023-07-23 NOTE — Telephone Encounter (Signed)
 Called the patient to get them scheduled for nutrition. Patient does not want to proceed with scheduling at this time.

## 2023-07-24 ENCOUNTER — Telehealth: Payer: Self-pay | Admitting: Nutrition

## 2023-07-24 NOTE — Telephone Encounter (Signed)
 Will await f/u from patient about med switch/ She is currently undergoing procedures by oncology

## 2023-07-24 NOTE — Telephone Encounter (Signed)
 Patient stated that she wants to wait to schedule her nutrition appointment when treatment starts so that she can align her appointments on the days she needs to come in for treatment.

## 2023-07-25 LAB — AEROBIC CULTURE W GRAM STAIN (SUPERFICIAL SPECIMEN): Gram Stain: NONE SEEN

## 2023-07-27 ENCOUNTER — Other Ambulatory Visit: Payer: Self-pay

## 2023-07-27 ENCOUNTER — Ambulatory Visit
Admission: RE | Admit: 2023-07-27 | Discharge: 2023-07-27 | Disposition: A | Discharge status: Home / Self Care | Source: Ambulatory Visit | Attending: Radiation Oncology

## 2023-07-27 ENCOUNTER — Ambulatory Visit
Admission: RE | Admit: 2023-07-27 | Discharge: 2023-07-27 | Disposition: A | Discharge status: Home / Self Care | Payer: Medicare PPO | Source: Ambulatory Visit | Attending: Radiation Oncology

## 2023-07-27 ENCOUNTER — Ambulatory Visit
Admission: RE | Admit: 2023-07-27 | Discharge: 2023-07-27 | Disposition: A | Discharge status: Home / Self Care | Payer: Medicare PPO | Source: Ambulatory Visit | Attending: Radiation Oncology | Admitting: Radiation Oncology

## 2023-07-27 DIAGNOSIS — Z510 Encounter for antineoplastic radiation therapy: Secondary | ICD-10-CM | POA: Insufficient documentation

## 2023-07-27 DIAGNOSIS — C44222 Squamous cell carcinoma of skin of right ear and external auricular canal: Secondary | ICD-10-CM | POA: Insufficient documentation

## 2023-07-27 DIAGNOSIS — C770 Secondary and unspecified malignant neoplasm of lymph nodes of head, face and neck: Secondary | ICD-10-CM | POA: Diagnosis not present

## 2023-07-27 DIAGNOSIS — C44320 Squamous cell carcinoma of skin of unspecified parts of face: Secondary | ICD-10-CM | POA: Diagnosis not present

## 2023-07-27 DIAGNOSIS — Z85828 Personal history of other malignant neoplasm of skin: Secondary | ICD-10-CM | POA: Insufficient documentation

## 2023-07-27 LAB — CMP (CANCER CENTER ONLY)
ALT: 8 U/L (ref 0–44)
AST: 9 U/L — ABNORMAL LOW (ref 15–41)
Albumin: 4.1 g/dL (ref 3.5–5.0)
Alkaline Phosphatase: 117 U/L (ref 38–126)
Anion gap: 6 (ref 5–15)
BUN: 10 mg/dL (ref 8–23)
CO2: 25 mmol/L (ref 22–32)
Calcium: 9 mg/dL (ref 8.9–10.3)
Chloride: 109 mmol/L (ref 98–111)
Creatinine: 0.8 mg/dL (ref 0.44–1.00)
GFR, Estimated: >60 mL/min (ref >60)
Glucose, Bld: 110 mg/dL — ABNORMAL HIGH (ref 70–99)
Potassium: 3.6 mmol/L (ref 3.5–5.1)
Sodium: 140 mmol/L (ref 135–145)
Total Bilirubin: 0.5 mg/dL (ref 0.0–1.2)
Total Protein: 7 g/dL (ref 6.5–8.1)

## 2023-07-27 LAB — ANAEROBIC CULTURE W GRAM STAIN: Gram Stain: NONE SEEN

## 2023-07-27 NOTE — Progress Notes (Signed)
 Has armband been applied?  Yes.    Does patient have an allergy to IV contrast dye?: No.   Has patient ever received premedication for IV contrast dye?: No.   Date of lab work: July 27, 2023 BUN: 10 CR: 0.80 eGFR: >60  Does patient take metformin?: No.   IV site: Right AC  Has IV site been added to flowsheet?  Yes.    BP (!) 164/83 (BP Location: Right Arm, Patient Position: Sitting, Cuff Size: Normal)   Pulse 82   Temp 98.1 F (36.7 C)   Resp 20   Ht 5' 5.5" (1.664 m)   Wt 148 lb 9.6 oz (67.4 kg)   SpO2 100%   BMI 24.35 kg/m    Wt Readings from Last 3 Encounters:  07/27/23 148 lb 9.6 oz (67.4 kg)  07/22/23 148 lb (67.1 kg)  06/24/23 153 lb (69.4 kg)

## 2023-07-28 ENCOUNTER — Encounter (HOSPITAL_BASED_OUTPATIENT_CLINIC_OR_DEPARTMENT_OTHER): Payer: Self-pay | Admitting: Otolaryngology

## 2023-07-28 ENCOUNTER — Other Ambulatory Visit: Payer: Self-pay

## 2023-07-29 DIAGNOSIS — Z85828 Personal history of other malignant neoplasm of skin: Secondary | ICD-10-CM | POA: Diagnosis not present

## 2023-07-29 DIAGNOSIS — C770 Secondary and unspecified malignant neoplasm of lymph nodes of head, face and neck: Secondary | ICD-10-CM | POA: Diagnosis not present

## 2023-07-29 DIAGNOSIS — C44320 Squamous cell carcinoma of skin of unspecified parts of face: Secondary | ICD-10-CM | POA: Diagnosis not present

## 2023-07-29 DIAGNOSIS — C44222 Squamous cell carcinoma of skin of right ear and external auricular canal: Secondary | ICD-10-CM | POA: Diagnosis not present

## 2023-07-29 DIAGNOSIS — Z510 Encounter for antineoplastic radiation therapy: Secondary | ICD-10-CM | POA: Diagnosis not present

## 2023-07-30 ENCOUNTER — Ambulatory Visit: Payer: Medicare PPO | Admitting: Podiatry

## 2023-07-31 ENCOUNTER — Inpatient Hospital Stay: Attending: Radiation Oncology

## 2023-07-31 NOTE — Progress Notes (Signed)
 [facility] Clinical Social Work  Initial Assessment   AZULA ZAPPIA is a 68 y.o. year old female contacted by phone. Clinical Social Work was referred by new patient protocol for assessment of psychosocial needs.   SDOH (Social Determinants of Health) assessments performed: Yes   SDOH Screenings   Food Insecurity: No Food Insecurity (07/22/2023)  Housing: Low Risk  (07/22/2023)  Transportation Needs: No Transportation Needs (07/22/2023)  Utilities: Not At Risk (07/22/2023)  Depression (PHQ2-9): Low Risk  (07/22/2023)  Tobacco Use: High Risk (07/28/2023)     Distress Screen completed: No     No data to display            Family/Social Information:  Housing Arrangement: patient lives with spouse. Family members/support persons in your life? Patient reported her daughters, husband, and church community have been large supports. Patient stated she has had medical concerns since January 2025 and her family has stepped in to help.  Transportation concerns: Patient stated her husband will transport her from [place] to [facility] for treatment. Patient stated her support system will step in and help if needed.   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 yes Concerns about diagnosis and/or treatment:  None Patient reported stressors:  None Patient enjoys  Spending time at church.  Current coping skills/ strengths: Ability for insight , Active sense of humor , and Capable of independent living     SUMMARY: Current SDOH Barriers:  None At this time.   Clinical Social Work Clinical Goal(s):  No clinical social work  goals at this time  Interventions: Discussed common feeling and emotions when being diagnosed with cancer, and the importance of support during treatment Informed patient of the support team roles and support services at [facility] Provided CSW contact information and encouraged patient to call with any questions or concerns   Follow Up Plan: Patient will contact CSW with any support or resource needs Patient verbalizes understanding of plan: Yes    Marguerita Merles, LCSW Clinical Social Worker [facility] [phone]

## 2023-08-03 ENCOUNTER — Ambulatory Visit (HOSPITAL_BASED_OUTPATIENT_CLINIC_OR_DEPARTMENT_OTHER): Payer: Self-pay | Admitting: Anesthesiology

## 2023-08-03 ENCOUNTER — Other Ambulatory Visit: Payer: Self-pay

## 2023-08-03 ENCOUNTER — Encounter (HOSPITAL_BASED_OUTPATIENT_CLINIC_OR_DEPARTMENT_OTHER): Payer: Self-pay | Admitting: Otolaryngology

## 2023-08-03 ENCOUNTER — Ambulatory Visit (HOSPITAL_BASED_OUTPATIENT_CLINIC_OR_DEPARTMENT_OTHER)
Admission: RE | Admit: 2023-08-03 | Discharge: 2023-08-03 | Disposition: A | Discharge status: Home / Self Care | Payer: Medicare PPO | Attending: Otolaryngology | Admitting: Otolaryngology

## 2023-08-03 ENCOUNTER — Encounter (HOSPITAL_BASED_OUTPATIENT_CLINIC_OR_DEPARTMENT_OTHER)
Admission: RE | Disposition: A | Discharge status: Home / Self Care | Payer: Self-pay | Source: Home / Self Care | Attending: Otolaryngology

## 2023-08-03 DIAGNOSIS — Z79899 Other long term (current) drug therapy: Secondary | ICD-10-CM | POA: Insufficient documentation

## 2023-08-03 DIAGNOSIS — C4400 Unspecified malignant neoplasm of skin of lip: Secondary | ICD-10-CM | POA: Diagnosis not present

## 2023-08-03 DIAGNOSIS — M069 Rheumatoid arthritis, unspecified: Secondary | ICD-10-CM | POA: Insufficient documentation

## 2023-08-03 DIAGNOSIS — K219 Gastro-esophageal reflux disease without esophagitis: Secondary | ICD-10-CM | POA: Diagnosis not present

## 2023-08-03 DIAGNOSIS — F17210 Nicotine dependence, cigarettes, uncomplicated: Secondary | ICD-10-CM | POA: Insufficient documentation

## 2023-08-03 DIAGNOSIS — C4402 Squamous cell carcinoma of skin of lip: Secondary | ICD-10-CM

## 2023-08-03 DIAGNOSIS — C001 Malignant neoplasm of external lower lip: Secondary | ICD-10-CM | POA: Insufficient documentation

## 2023-08-03 DIAGNOSIS — Z01818 Encounter for other preprocedural examination: Secondary | ICD-10-CM

## 2023-08-03 DIAGNOSIS — E785 Hyperlipidemia, unspecified: Secondary | ICD-10-CM | POA: Diagnosis not present

## 2023-08-03 DIAGNOSIS — C004 Malignant neoplasm of lower lip, inner aspect: Secondary | ICD-10-CM | POA: Diagnosis not present

## 2023-08-03 DIAGNOSIS — D0001 Carcinoma in situ of labial mucosa and vermilion border: Secondary | ICD-10-CM | POA: Diagnosis not present

## 2023-08-03 DIAGNOSIS — I10 Essential (primary) hypertension: Secondary | ICD-10-CM | POA: Diagnosis not present

## 2023-08-03 HISTORY — PX: EXCISION MASS HEAD: SHX6702

## 2023-08-03 SURGERY — EXCISION, MASS, HEAD
Anesthesia: General | Site: Mouth | Laterality: Bilateral

## 2023-08-03 MED ORDER — ONDANSETRON HCL 4 MG/2ML IJ SOLN: 4.0000 mg | Freq: Once | INTRAMUSCULAR | Status: DC | PRN

## 2023-08-03 MED ORDER — ONDANSETRON HCL 4 MG/2ML IJ SOLN
INTRAMUSCULAR | Status: AC
  Filled 2023-08-03: qty 2

## 2023-08-03 MED ORDER — MIDAZOLAM HCL 2 MG/2ML IJ SOLN
INTRAMUSCULAR | Status: AC
  Filled 2023-08-03: qty 2

## 2023-08-03 MED ORDER — OXYCODONE HCL 5 MG/5ML PO SOLN: 5.0000 mg | Freq: Once | ORAL | Status: DC | PRN

## 2023-08-03 MED ORDER — DEXAMETHASONE SODIUM PHOSPHATE 10 MG/ML IJ SOLN
INTRAMUSCULAR | Status: AC
  Filled 2023-08-03: qty 1

## 2023-08-03 MED ORDER — ONDANSETRON HCL 4 MG/2ML IJ SOLN
INTRAMUSCULAR | Status: DC | PRN
  Administered 2023-08-03: 4 mg via INTRAVENOUS

## 2023-08-03 MED ORDER — FENTANYL CITRATE (PF) 100 MCG/2ML IJ SOLN
INTRAMUSCULAR | Status: AC
  Filled 2023-08-03: qty 2

## 2023-08-03 MED ORDER — WHITE PETROLATUM EX OINT
TOPICAL_OINTMENT | CUTANEOUS | Status: AC
  Filled 2023-08-03: qty 28.35

## 2023-08-03 MED ORDER — PROPOFOL 500 MG/50ML IV EMUL
INTRAVENOUS | Status: AC
  Filled 2023-08-03: qty 50

## 2023-08-03 MED ORDER — PHENYLEPHRINE 80 MCG/ML (10ML) SYRINGE FOR IV PUSH (FOR BLOOD PRESSURE SUPPORT)
PREFILLED_SYRINGE | INTRAVENOUS | Status: AC
  Filled 2023-08-03: qty 10

## 2023-08-03 MED ORDER — PHENYLEPHRINE HCL-NACL 20-0.9 MG/250ML-% IV SOLN
INTRAVENOUS | Status: DC | PRN
  Administered 2023-08-03 (×2): 80 ug via INTRAVENOUS
  Administered 2023-08-03: 120 ug via INTRAVENOUS
  Administered 2023-08-03: 80 ug via INTRAVENOUS

## 2023-08-03 MED ORDER — CEFAZOLIN SODIUM-DEXTROSE 2-4 GM/100ML-% IV SOLN
2.0000 g | INTRAVENOUS | Status: AC
Start: 2023-08-04 — End: 2023-08-03
  Administered 2023-08-03: 2 g via INTRAVENOUS

## 2023-08-03 MED ORDER — WHITE PETROLATUM EX OINT
TOPICAL_OINTMENT | CUTANEOUS | Status: DC | PRN
Start: 2023-08-03 — End: 2023-08-03
  Administered 2023-08-03: 1 via TOPICAL

## 2023-08-03 MED ORDER — MEPERIDINE HCL 25 MG/ML IJ SOLN: 6.2500 mg | INTRAMUSCULAR | Status: DC | PRN

## 2023-08-03 MED ORDER — FENTANYL CITRATE (PF) 100 MCG/2ML IJ SOLN
25.0000 ug | INTRAMUSCULAR | Status: DC | PRN
Start: 2023-08-03 — End: 2023-08-03

## 2023-08-03 MED ORDER — CEFAZOLIN SODIUM-DEXTROSE 2-4 GM/100ML-% IV SOLN
INTRAVENOUS | Status: AC
  Filled 2023-08-03: qty 100

## 2023-08-03 MED ORDER — OXYCODONE HCL 5 MG PO TABS: 5.0000 mg | ORAL_TABLET | Freq: Once | ORAL | Status: DC | PRN

## 2023-08-03 MED ORDER — LIDOCAINE 2% (20 MG/ML) 5 ML SYRINGE
INTRAMUSCULAR | Status: DC | PRN
  Administered 2023-08-03: 100 mg via INTRAVENOUS

## 2023-08-03 MED ORDER — FENTANYL CITRATE (PF) 250 MCG/5ML IJ SOLN
INTRAMUSCULAR | Status: DC | PRN
  Administered 2023-08-03 (×3): 50 ug via INTRAVENOUS

## 2023-08-03 MED ORDER — PROPOFOL 10 MG/ML IV BOLUS
INTRAVENOUS | Status: AC
  Filled 2023-08-03: qty 20

## 2023-08-03 MED ORDER — SUCCINYLCHOLINE CHLORIDE 200 MG/10ML IV SOSY
PREFILLED_SYRINGE | INTRAVENOUS | Status: DC | PRN
  Administered 2023-08-03: 140 mg via INTRAVENOUS

## 2023-08-03 MED ORDER — ACETAMINOPHEN 160 MG/5ML PO SOLN: 325.0000 mg | ORAL | Status: DC | PRN

## 2023-08-03 MED ORDER — MIDAZOLAM HCL 5 MG/5ML IJ SOLN
INTRAMUSCULAR | Status: DC | PRN
  Administered 2023-08-03: 1 mg via INTRAVENOUS

## 2023-08-03 MED ORDER — LIDOCAINE-EPINEPHRINE 1 %-1:100000 IJ SOLN
INTRAMUSCULAR | Status: DC | PRN
  Administered 2023-08-03: 5 mL

## 2023-08-03 MED ORDER — LACTATED RINGERS IV SOLN: INTRAVENOUS | Status: DC

## 2023-08-03 MED ORDER — PROPOFOL 10 MG/ML IV BOLUS
INTRAVENOUS | Status: DC | PRN
  Administered 2023-08-03: 60 mg via INTRAVENOUS
  Administered 2023-08-03: 140 mg via INTRAVENOUS

## 2023-08-03 MED ORDER — OXYCODONE HCL 5 MG PO TABS: 5.0000 mg | ORAL_TABLET | ORAL | 0 refills | Status: AC | PRN

## 2023-08-03 MED ORDER — DEXAMETHASONE SODIUM PHOSPHATE 10 MG/ML IJ SOLN
INTRAMUSCULAR | Status: DC | PRN
  Administered 2023-08-03: 10 mg via INTRAVENOUS

## 2023-08-03 MED ORDER — ACETAMINOPHEN 325 MG PO TABS: 325.0000 mg | ORAL_TABLET | ORAL | Status: DC | PRN

## 2023-08-03 SURGICAL SUPPLY — 72 items
APPLICATOR DR MATTHEWS STRL (MISCELLANEOUS) ×2 IMPLANT
BENZOIN TINCTURE PRP APPL 2/3 (GAUZE/BANDAGES/DRESSINGS) IMPLANT
BLADE CLIPPER SURG (BLADE) IMPLANT
BLADE SURG 15 STRL LF DISP TIS (BLADE) ×4 IMPLANT
CANISTER SUCT 1200ML W/VALVE (MISCELLANEOUS) ×1 IMPLANT
CORD BIPOLAR FORCEPS 12FT (ELECTRODE) IMPLANT
COTTONBALL LRG STERILE PKG (GAUZE/BANDAGES/DRESSINGS) ×1 IMPLANT
COVER BACK TABLE 60X90IN (DRAPES) ×2 IMPLANT
COVER MAYO STAND STRL (DRAPES) ×2 IMPLANT
DRAPE HEAD BAR (DRAPES) IMPLANT
DRAPE U-SHAPE 76X120 STRL (DRAPES) ×2 IMPLANT
DRSG TEGADERM 2-3/8X2-3/4 SM (GAUZE/BANDAGES/DRESSINGS) ×2 IMPLANT
DRSG TELFA 3X8 NADH STRL (GAUZE/BANDAGES/DRESSINGS) IMPLANT
ELECT COATED BLADE 2.86 ST (ELECTRODE) IMPLANT
ELECT NDL BLADE 2-5/6 (NEEDLE) ×1 IMPLANT
ELECT NEEDLE BLADE 2-5/6 (NEEDLE) ×2 IMPLANT
ELECT REM PT RETURN 9FT ADLT (ELECTROSURGICAL) ×2 IMPLANT
ELECTRODE REM PT RTRN 9FT ADLT (ELECTROSURGICAL) ×1 IMPLANT
FORCEPS BIPOLAR SPETZLER 8 1.0 (NEUROSURGERY SUPPLIES) IMPLANT
GAUZE 4X4 16PLY ~~LOC~~+RFID DBL (SPONGE) IMPLANT
GAUZE PAD ABD 8X10 STRL (GAUZE/BANDAGES/DRESSINGS) IMPLANT
GAUZE SPONGE 2X2 STRL 8-PLY (GAUZE/BANDAGES/DRESSINGS) IMPLANT
GAUZE SPONGE 4X4 12PLY STRL (GAUZE/BANDAGES/DRESSINGS) IMPLANT
GAUZE XEROFORM 1X8 LF (GAUZE/BANDAGES/DRESSINGS) IMPLANT
GLOVE BIO SURGEON STRL SZ7 (GLOVE) ×1 IMPLANT
GLOVE BIO SURGEON STRL SZ7.5 (GLOVE) ×3 IMPLANT
GLOVE BIOGEL PI IND STRL 7.0 (GLOVE) ×1 IMPLANT
GLOVE BIOGEL PI IND STRL 8 (GLOVE) ×3 IMPLANT
GLOVE INDICATOR 7.0 STRL GRN (GLOVE) ×1 IMPLANT
GOWN STRL REUS W/ TWL LRG LVL3 (GOWN DISPOSABLE) ×2 IMPLANT
GOWN STRL REUS W/ TWL XL LVL3 (GOWN DISPOSABLE) ×3 IMPLANT
MARKER SKIN DUAL TIP RULER LAB (MISCELLANEOUS) ×1 IMPLANT
NDL HYPO 22X1.5 SAFETY MO (MISCELLANEOUS) IMPLANT
NDL HYPO 25X1 1.5 SAFETY (NEEDLE) IMPLANT
NDL HYPO 30GX1 BEV (NEEDLE) ×1 IMPLANT
NDL PRECISIONGLIDE 27X1.5 (NEEDLE) ×1 IMPLANT
NEEDLE HYPO 22X1.5 SAFETY MO (MISCELLANEOUS) IMPLANT
NEEDLE HYPO 25X1 1.5 SAFETY (NEEDLE) IMPLANT
NEEDLE HYPO 30GX1 BEV (NEEDLE) IMPLANT
NEEDLE PRECISIONGLIDE 27X1.5 (NEEDLE) ×2 IMPLANT
NS IRRIG 1000ML POUR BTL (IV SOLUTION) ×2 IMPLANT
PACK BASIN DAY SURGERY FS (CUSTOM PROCEDURE TRAY) ×2 IMPLANT
PAD ALCOHOL SWAB (MISCELLANEOUS) IMPLANT
PENCIL SMOKE EVACUATOR (MISCELLANEOUS) ×2 IMPLANT
SHEET MEDIUM DRAPE 40X70 STRL (DRAPES) ×2 IMPLANT
SLEEVE SCD COMPRESS KNEE MED (STOCKING) ×2 IMPLANT
SPONGE T-LAP 18X18 ~~LOC~~+RFID (SPONGE) IMPLANT
STAPLER SKIN PROX WIDE 3.9 (STAPLE) IMPLANT
STRIP CLOSURE SKIN 1/2X4 (GAUZE/BANDAGES/DRESSINGS) IMPLANT
STRIP CLOSURE SKIN 1/4X4 (GAUZE/BANDAGES/DRESSINGS) IMPLANT
SUCTION TUBE FRAZIER 10FR DISP (SUCTIONS) ×2 IMPLANT
SUT CHROMIC 4 0 P 3 18 (SUTURE) IMPLANT
SUT CHROMIC 4 0 RB 1X27 (SUTURE) ×1 IMPLANT
SUT CHROMIC 5 0 P 3 (SUTURE) ×1 IMPLANT
SUT ETHILON 4 0 CL P 3 (SUTURE) IMPLANT
SUT ETHILON 5 0 PS 2 18 (SUTURE) IMPLANT
SUT ETHILON 6 0 PS 3 18 (SUTURE) ×1 IMPLANT
SUT MON AB 4-0 PC3 18 (SUTURE) ×1 IMPLANT
SUT MON AB 5-0 P3 18 (SUTURE) ×1 IMPLANT
SUT NYLON ETHILON 5-0 P-3 1X18 (SUTURE) ×1 IMPLANT
SUT SILK 4 0 PS 2 (SUTURE) ×1 IMPLANT
SUT VIC AB 4-0 PS2 18 (SUTURE) ×1 IMPLANT
SUT VIC AB 4-0 PS2 27 (SUTURE) IMPLANT
SUT VICRYL RAPIDE 4-0 (SUTURE) IMPLANT
SYR 3ML 23GX1 SAFETY (SYRINGE) IMPLANT
SYR CONTROL 10ML LL (SYRINGE) ×2 IMPLANT
SYR TB 1ML LL NO SAFETY (SYRINGE) IMPLANT
TAPE UMBILICAL 1/8 X36 TWILL (MISCELLANEOUS) IMPLANT
TOWEL GREEN STERILE FF (TOWEL DISPOSABLE) ×4 IMPLANT
TRAY DSU PREP LF (CUSTOM PROCEDURE TRAY) ×2 IMPLANT
TUBE CONNECTING 20X1/4 (TUBING) ×2 IMPLANT
UNDERPAD 30X36 HEAVY ABSORB (UNDERPADS AND DIAPERS) ×2 IMPLANT

## 2023-08-03 NOTE — Anesthesia Postprocedure Evaluation (Signed)
 Anesthesia Post Note  Patient: Dawn Meyer  Procedure(s) Performed: 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; with primary closure (Bilateral: Mouth)     Patient location during evaluation: PACU Anesthesia Type: General Level of consciousness: awake and alert Pain management: pain level controlled Vital Signs Assessment: post-procedure vital signs reviewed and stable Respiratory status: spontaneous breathing, nonlabored ventilation, respiratory function stable and patient connected to nasal cannula oxygen Cardiovascular status: blood pressure returned to baseline and stable Postop Assessment: no apparent nausea or vomiting Anesthetic complications: no   No notable events documented.  Last Vitals:  Vitals:   08/03/23 1700 08/03/23 1729  BP: 135/70 (!) 139/90  Pulse: 72 73  Resp: 15 16  Temp:  36.6 C  SpO2: 96% 95%    Last Pain:  Vitals:   08/03/23 1715  TempSrc:   PainSc: 0-No pain                 Renaye Janicki

## 2023-08-03 NOTE — Anesthesia Preprocedure Evaluation (Addendum)
 Anesthesia Evaluation  Patient identified by MRN, date of birth, ID band Patient awake    Reviewed: Allergy & Precautions, NPO status , Patient's Chart, lab work & pertinent test results  History of Anesthesia Complications Negative for: history of anesthetic complications  Airway Mallampati: IV  TM Distance: >3 FB Neck ROM: Full  Mouth opening: Limited Mouth Opening Comment: Patient reports mouth opening limited due to pain from cheek. Dental  (+) Partial Upper, Partial Lower, Dental Advisory Given   Pulmonary neg shortness of breath, asthma , neg sleep apnea, COPD (used Trelegy this morning; has not needed rescue inhaler recently),  COPD inhaler, neg recent URI, Current Smoker and Patient abstained from smoking.   Pulmonary exam normal breath sounds clear to auscultation       Cardiovascular hypertension, Pt. on medications (-) angina (-) Past MI, (-) Cardiac Stents and (-) CABG (-) dysrhythmias  Rhythm:Regular Rate:Normal  HLD   Neuro/Psych negative neurological ROS     GI/Hepatic Neg liver ROS,GERD  ,,  Endo/Other  negative endocrine ROS    Renal/GU negative Renal ROS     Musculoskeletal  (+) Arthritis , Rheumatoid disorders,    Abdominal   Peds  Hematology negative hematology ROS (+)   Anesthesia Other Findings Skin cancers around mouth  Reproductive/Obstetrics                             Anesthesia Physical Anesthesia Plan  ASA: 2  Anesthesia Plan: General   Post-op Pain Management: Tylenol PO (pre-op)*   Induction: Intravenous  PONV Risk Score and Plan: 2 and Ondansetron, Dexamethasone and Treatment may vary due to age or medical condition  Airway Management Planned: Oral ETT and Video Laryngoscope Planned  Additional Equipment:   Intra-op Plan:   Post-operative Plan: Extubation in OR  Informed Consent: I have reviewed the patients History and Physical, chart, labs and  discussed the procedure including the risks, benefits and alternatives for the proposed anesthesia with the patient or authorized representative who has indicated his/her understanding and acceptance.     Dental advisory given  Plan Discussed with: CRNA and Anesthesiologist  Anesthesia Plan Comments: (Risks of general anesthesia discussed including, but not limited to, sore throat, hoarse voice, chipped/damaged teeth, injury to vocal cords, nausea and vomiting, allergic reactions, lung infection, heart attack, stroke, and death. All questions answered. )        Anesthesia Quick Evaluation

## 2023-08-03 NOTE — Op Note (Signed)
 OPERATIVE NOTE  Dawn Meyer Date/Time of Admission: 08/03/2023 11:55 AM  CSN: [id number];QQV:[id number] Attending Provider: Scarlette Ar, MD Room/Bed: MCSP/NONE DOB: [birth year] Age: 68 y.o.   Pre-Op Diagnosis: Squamous cell carcinoma, bilateral lower lip  Post-Op Diagnosis: Squamous cell carcinoma, bilateral lower lip  Procedure: Wide local excision of squamous cell carcinoma right lower lip with primary layered closure (CPT 8131457293) Wide local excision of squamous cell carcinoma left lower lip with primary layered closure (CPT 40510)  Anesthesia: General  Surgeon(s): Mervin Kung, MD  Staff: Circulator: Lenn Cal, RN Scrub Person: Elvina Mattes Circulator Assistant: Macie Burows, RN  Implants: * No implants in log *  Specimens: ID Type Source Tests Collected by Time Destination  1 : Right lower lip medial margin Tissue Path Tissue SURGICAL PATHOLOGY Scarlette Ar, MD 08/03/2023 1447   2 : Right lower lip lateral margin Tissue Path Tissue SURGICAL PATHOLOGY Scarlette Ar, MD 08/03/2023 1457   3 : Right lower lip medial mucosal margin Tissue Path Tissue SURGICAL PATHOLOGY Scarlette Ar, MD 08/03/2023 1458   4 : Right lower lip lateral mucosal margin Tissue Path Tissue SURGICAL PATHOLOGY Scarlette Ar, MD 08/03/2023 1459   5 : Left lower lip medial skin margin Tissue Path Tissue SURGICAL PATHOLOGY Scarlette Ar, MD 08/03/2023 1501   6 : Left lower lip lateral skin margin Tissue Path Tissue SURGICAL PATHOLOGY Scarlette Ar, MD 08/03/2023 1502   7 : Left lower lip medial mucosal margin Tissue Path Tissue SURGICAL PATHOLOGY Scarlette Ar, MD 08/03/2023 1503   8 : Left lower lip lateral mucosal margin Tissue Path Tissue SURGICAL PATHOLOGY Scarlette Ar, MD 08/03/2023 1504   9 : Right lower lip malignancy, wide local excision Tissue PATH Other SURGICAL PATHOLOGY Scarlette Ar, MD 08/03/2023 1506   10 : Left lower lip malignancy, wide local excision Tissue PATH  Other SURGICAL PATHOLOGY Scarlette Ar, MD 08/03/2023 1508     Complications: none  EBL: 20 ML  IVF: Per anesthesia ML  Condition: stable  Operative Findings:  1cm squamous cell carcinoma right lower cutaneous lip just below vermillion excised to negative margins 7mm squamous cell carcinoma left lower cutaneous lip just below vermillion excised to negative margins  Indications for Procedure: 68 year old female with history of numerous cutaneous malignancies including a large right facial/parotid SCC s/p mohs excision (Dr. Jeannine Boga) and cervicofacial flap reconstruction with myself 06/24/23, staged ectropion repair and upper eyelid platinum weight insertion for paralytic ectropion/lagophthalmos who presents today for two biopsy proven squamous cell carcinomas of the lower lip. Informed consent was obtained.  RBA discussed including risks of pain, bleeding, infection, scarring, numbness, microstomia, need for further surgery, risk of anesthesia.  Despite this risk the patient requested to proceed with surgery.  Description of Operation:  The patient was identified in the preoperative area and consent confirmed in the chart.  She was brought to the operating room by anesthesia and a preoperative timeout was performed confirming the patient identity and procedure to be performed.  Once all were in agreement we proceeded with surgery.  General anesthesia was induced and the patient intubated with oral endotracheal tube.  The patient's lower lip was examined with findings as noted above.  Approximately 8 mm margins were marked out.  The lip was then anesthetized with 1% lidocaine 1 100,000 epinephrine.  Patient prepped and draped in standard sterile fashion for procedure of this kind.  A final preoperative pause was performed and we proceeded with surgery.  I began with wide local  excision of the right lower lip cutaneous squamous cell carcinoma approximately 1 cm in diameter.  With about  8 mm  margins marked out and a wedge a 15 blade was used to create a wedge excision full-thickness through the muscular layer and oral mucosa.  The specimen was oriented with a silk marking stitch and sent for permanent pathology.  This created a 2.5x2.5cm defect. Next circumferential mucosal and cutaneous margins were taken, all negative for carcinoma.  I then proceeded with wide local excision of the left lower lip cutaneous squamous cell carcinoma approximately 7 mm diameter with approximately 5 mm margins (Smaller more superficial lesion).  A wedge excision was designed with these margins and performed with a 15 blade in similar fashion to the right lower lip creating a 1.5x1.5cm defect.  Full-thickness excision was performed through the orbicularis oris and the oral mucosa.  Bleeding was controlled with Bovie.  The specimen was oriented with a silk marking stitch and sent for permanent pathology.  Circumferential frozen sections were taken from the epidermis and mucosa all negative for carcinoma.  I then performed layered closure of the right and left with left lower lip realigned vermilion border and using buried interrupted 4-0 Vicryl sutures for the orbicularis oris muscular layer followed by buried interrupted 5-0 Monocryl for the dermal layer and interrupted 6-0 nylon for skin.  The oral lip mucosa was closed with interrupted 5-0 and 4-0 Chromic Gut sutures.  There was good vascularity to the lip at the end of the procedure.  The wounds were cleansed with and dressed with bacitracin.  The patient was then turned back to anesthesia extubated and brought to the recovery room in stable condition.  All counts were correct and final.  Mervin Kung, MD [facility]  08/03/2023

## 2023-08-03 NOTE — Transfer of Care (Signed)
 Immediate Anesthesia Transfer of Care Note  Patient: Dawn Meyer  Procedure(s) Performed: 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; with primary closure (Bilateral: Mouth)  Patient Location: PACU  Anesthesia Type:General  Level of Consciousness: drowsy  Airway & Oxygen Therapy: Patient Spontanous Breathing and Patient connected to face mask oxygen  Post-op Assessment: Report given to RN and Post -op Vital signs reviewed and stable  Post vital signs: Reviewed and stable  Last Vitals:  Vitals Value Taken Time  BP 151/71 08/03/23 1630  Temp    Pulse 92 08/03/23 1632  Resp 17 08/03/23 1632  SpO2 99 % 08/03/23 1632  Vitals shown include unfiled device data.  Last Pain:  Vitals:   08/03/23 1230  TempSrc: Tympanic  PainSc: 0-No pain      Patients Stated Pain Goal: 7 (08/03/23 1230)  Complications: No notable events documented.

## 2023-08-03 NOTE — Anesthesia Procedure Notes (Signed)
 Procedure Name: Intubation Date/Time: 08/03/2023 2:21 PM  Performed by: Roosvelt Harps, CRNAPre-anesthesia Checklist: Patient identified, Emergency Drugs available, Suction available and Patient being monitored Patient Re-evaluated:Patient Re-evaluated prior to induction Oxygen Delivery Method: Circle System Utilized Preoxygenation: Pre-oxygenation with 100% oxygen Induction Type: IV induction Ventilation: Mask ventilation without difficulty Laryngoscope Size: Mac and 3 Grade View: Grade I Tube type: Oral Tube size: 7.0 mm Number of attempts: 1 Airway Equipment and Method: Stylet and Oral airway Placement Confirmation: ETT inserted through vocal cords under direct vision, positive ETCO2 and breath sounds checked- equal and bilateral Secured at: 21 cm Tube secured with: Tape Dental Injury: Teeth and Oropharynx as per pre-operative assessment

## 2023-08-03 NOTE — H&P (Signed)
 Dawn Meyer is an 68 y.o. female.    Chief Complaint:  Bilateral lower lip squamous cell carcinoma  HPI: Patient presents today for planned elective procedure.  He/she denies any interval change in history since office visit on 07/24/23.   Past Medical History:  Diagnosis Date   Asthma    COPD (chronic obstructive pulmonary disease) (HCC)    GERD (gastroesophageal reflux disease)    Hypertension    Prolapsed uterus    per patient, dx by OB-GYN   RA (rheumatoid arthritis) (HCC)    Smoker    1/4/ppd    Past Surgical History:  Procedure Laterality Date   ADJACENT TISSUE TRANSFER/TISSUE REARRANGEMENT Right 06/24/2023   Procedure: ADJACENT TISSUE TRANSFER/TISSUE REARRANGEMENT;  Surgeon: Scarlette Ar, MD;  Location: [facility];  Service: ENT;  Laterality: Right;   COLONOSCOPY WITH PROPOFOL N/A 08/09/2021   Procedure: COLONOSCOPY WITH PROPOFOL;  Surgeon: Corbin Ade, MD;  [facility];  Service: Endoscopy;  Laterality: N/A;  7:30am   EXCISION MASS HEAD Right 06/24/2023   Procedure: EXCISION AND REPAIR OF RIGHT CHEEK MOHS DEFECT;  Surgeon: Scarlette Ar, MD;  Location: [facility];  Service: ENT;  Laterality: Right;   [place] procedure  04/28/2022   for prolapsed cervix   MOHS SURGERY     SQUAMOUS CELL CARCINOMA EXCISION     VAGINAL DELIVERY      Family History  Problem Relation Age of Onset   Congestive Heart Failure Mother    Heart attack Mother    Diabetes Father    Heart Problems Father        pacemaker   Diverticulitis Sister    Hypertension Sister    Heart attack Brother    Healthy Daughter    Colon cancer Neg Hx     Social History:  reports that she has been smoking cigarettes. She has a 6 pack-year smoking history. She has never been exposed to tobacco smoke. She has never used smokeless tobacco. She reports that she does not drink alcohol and does not use drugs.  Allergies:  Allergies  Allergen Reactions    Doxycycline Rash   Aquaphor [Lanolin-Petrolatum] Rash    Redness, itchy    Medications Prior to Admission  Medication Sig Dispense Refill   Acetaminophen (TYLENOL ARTHRITIS PAIN PO) Take by mouth as needed.     albuterol (VENTOLIN HFA) 108 (90 Base) MCG/ACT inhaler INHALE TWO PUFFS INTO THE LUNGS EVERY SIX HOURS AS NEEDED FOR WHEEZING OR SHORTNESS OF BREATH 18 g 3   amLODipine (NORVASC) 10 MG tablet Take 1 tablet (10 mg total) by mouth daily. 90 tablet 3   cephALEXin (KEFLEX) 500 MG capsule Take 1 capsule (500 mg total) by mouth 4 (four) times daily. 28 capsule 0   fluconazole (DIFLUCAN) 100 MG tablet Take 2 tablets today, then 1 tablet daily x 9 more days. 11 tablet 0   Fluticasone-Umeclidin-Vilant (TRELEGY ELLIPTA) 100-62.5-25 MCG/ACT AEPB Inhale 1 puff into the lungs daily. 1 each 11    No results found for this or any previous visit (from the past 48 hours). No results found.  ROS: negative other than stated in HPI  Blood pressure (!) 140/68, pulse 76, temperature 98.8 F (37.1 C), temperature source Tympanic, resp. rate 16, height 5' 5.5" (1.664 m), weight 67.4 kg, SpO2 (!) 76%.  PHYSICAL EXAM: General: Resting comfortably in NAD  Lungs: Non-labored respiratinos  Studies Reviewed:  [facility] 07/22/23  Final Diagnosis   A. SKIN, RIGHT LOWER  LIP, BIOPSY:              SUPERFICIALLY INVASIVE SQUAMOUS CELL CARCINOMA EXTENDING TO THE BASE OF THE BIOPSY.    B. SKIN, LEFT LOWER LIP, BIOPSY:              SUPERFICIALLY INVASIVE SQUAMOUS CELL CARCINOMA EXTENDING TO THE BASE OF THE BIOPSY.     Electronically signed by Natalia Leatherwood, MD on 07/22/2023 at 1509 ES     Assessment/Plan Bilateral lower lip SCC  Proceed with WLE bilateral lower lip SCC. Informed consent obtained. RBA discussed.     Electronically signed by:  Scarlette Ar, MD  Staff Physician Facial Plastic & Reconstructive Surgery Otolaryngology - Head and Neck Surgery [facility]  [facility]  08/03/2023, 12:44 PM

## 2023-08-03 NOTE — Discharge Instructions (Addendum)
 Post-operative Patient Instructions Dawn Meyer. Hoshal MD  Surgery What to expect: A bandage will be placed on your surgical sites. You can leave the bandages in place until you return to the clinic. You may be scheduled for a series of wound care appointments over the next month.  Recovery/Restrictions: -No strenuous activity for at least the first week after your procedure -Bruising and swelling are expected and will take weeks to go away (consider using ice packs) -Please contact our office immediately if you experience any signs/symptoms of infection (redness, pain, or fever of 100.4F or greater)  Wound Wound care: The goal is to keep your wounds clean and moist to prevent scabs or crusts. You will keep your current post-operative dressing in place undisturbed until after your first post-operative clinic visit. The following instructions apply after your first post-operative visit.  1. Clean wound wounds with soap and water using a cue tip if any crusts are present  2. Next, apply a thin layer of Aquaphor ointment 3. Apply Telfa and cover with brown tape  4. If you had an ear surgery - please apply a thin layer of antibiotic ointment or Aquaphor ointment to your ear incision every day  Care Healing Period: For the best healing, please protect the area from the sun   You may be asked to begin massaging the scars several weeks after surgery. Scars can be massaged in horizontal, vertical, and circular motions.   Adult Post-Operative Pain Management  Pain medication is given immediately following your surgery to help with post-operative pain. Do not wake up or set an alarm to wake up and take pain medications. Sleep and rest.  Upon your discharge home, we suggest scheduled doses of Acetaminophen (Tylenol) every 6 hours and Ibuprofen (Motrin) every 6 hours, alternating between medications every 3 hours (i.e. Take Tylenol and wait 3 hours, then take Motrin and wait 3 hours, repeat) for the  first 3-4 days after surgery. If you are without significant pain, medications can be taken more infrequently. It is important to follow dosing instructions on the medication bottle or prescription.   Sample of medication dosing schedule  Give dose of: Time: Given:  Acetaminophen 12 a.m.   Ibuprofen 3 a.m.   Acetaminophen 6 a.m.   Ibuprofen 9 a.m.   Acetaminophen 12 p.m.   Ibuprofen 3 p.m.   Acetaminophen 6 p.m.   Ibuprofen 9 p.m.    If you need to call after clinic hours for a concern, call [phone] and ask for the "physician on call for ENT."  [address] [place], [place] [address] Phone: [phone]    Post Anesthesia Home Care Instructions  Activity: Get plenty of rest for the remainder of the day. A responsible individual must stay with you for 24 hours following the procedure.  For the next 24 hours, DO NOT: -Drive a car -Advertising copywriter -Drink alcoholic beverages -Take any medication unless instructed by your physician -Make any legal decisions or sign important papers.  Meals: Start with liquid foods such as gelatin or soup. Progress to regular foods as tolerated. Avoid greasy, spicy, heavy foods. If nausea and/or vomiting occur, drink only clear liquids until the nausea and/or vomiting subsides. Call your physician if vomiting continues.  Special Instructions/Symptoms: Your throat may feel dry or sore from the anesthesia or the breathing tube placed in your throat during surgery. If this causes discomfort, gargle with warm salt water. The discomfort should disappear within 24 hours.  If you had a  scopolamine patch placed behind your ear for the management of post- operative nausea and/or vomiting:  1. The medication in the patch is effective for 72 hours, after which it should be removed.  Wrap patch in a tissue and discard in the trash. Wash hands thoroughly with soap and water. 2. You may remove the patch earlier than 72 hours if you experience  unpleasant side effects which may include dry mouth, dizziness or visual disturbances. 3. Avoid touching the patch. Wash your hands with soap and water after contact with the patch.

## 2023-08-04 ENCOUNTER — Encounter (HOSPITAL_BASED_OUTPATIENT_CLINIC_OR_DEPARTMENT_OTHER): Payer: Self-pay | Admitting: Otolaryngology

## 2023-08-05 LAB — SURGICAL PATHOLOGY

## 2023-08-06 ENCOUNTER — Ambulatory Visit: Admitting: Radiation Oncology

## 2023-08-06 ENCOUNTER — Ambulatory Visit: Admission: RE | Admit: 2023-08-06 | Source: Ambulatory Visit | Admitting: Radiation Oncology

## 2023-08-07 ENCOUNTER — Ambulatory Visit

## 2023-08-10 ENCOUNTER — Ambulatory Visit
Admission: RE | Admit: 2023-08-10 | Discharge: 2023-08-10 | Disposition: A | Discharge status: Home / Self Care | Source: Ambulatory Visit | Attending: Radiation Oncology | Admitting: Radiation Oncology

## 2023-08-10 ENCOUNTER — Ambulatory Visit

## 2023-08-10 DIAGNOSIS — C770 Secondary and unspecified malignant neoplasm of lymph nodes of head, face and neck: Secondary | ICD-10-CM | POA: Diagnosis not present

## 2023-08-10 DIAGNOSIS — C44320 Squamous cell carcinoma of skin of unspecified parts of face: Secondary | ICD-10-CM | POA: Diagnosis not present

## 2023-08-10 NOTE — Progress Notes (Addendum)
 Pain issues, if any: Patient has some lip swelling that does affect her when she eats solid foods. She does eat soft foods for breakfast she eats scrambled eggs. Her diet consists of protein such as chicken noodle soup, mashed potatoes, macaroni and cheese, applesauce. Using a feeding tube?: Patient denies Weight changes, if any: Patient has lost 12 pounds since her surgery in January. Swallowing issues, if any: Patient denies Smoking or chewing tobacco? Yes, patient smokes one puff a day but she does wear a nicotine patch. She is actively trying to quit. Using fluoride toothpaste daily? Yes Last ENT visit was on: Patient seen ENT on 08/07/2023. Other notable issues, if any: None    BP (!) 155/78 (BP Location: Left Arm)   Pulse 67   Temp (!) 97.5 F (36.4 C) (Temporal)   Resp 18   Ht 5' 5.5" (1.664 m)   Wt 146 lb 4 oz (66.3 kg)   SpO2 100%   BMI 23.97 kg/m    Wt Readings from Last 3 Encounters:  08/10/23 146 lb 4 oz (66.3 kg)  08/03/23 148 lb 9.4 oz (67.4 kg)  07/27/23 148 lb 9.6 oz (67.4 kg)

## 2023-08-10 NOTE — Progress Notes (Signed)
 Radiation Oncology         [phone] ________________________________  Name: PAISLYN DOMENICO MRN: [id number]  Date: 08/10/2023  DOB: [birth year]  Follow-Up Visit Note  CC: Tommie Sams, DO  Tommie Sams, DO  Diagnosis and Prior Radiotherapy:       ICD-10-CM   1. Squamous cell carcinoma of skin of face  C44.320       Cancer Staging  Squamous cell carcinoma of skin of face Staging form: Cutaneous Carcinoma of the Head and Neck, AJCC 8th Edition - Clinical stage from 07/22/2023: Stage II (cT2, cN0, cM0) - Signed by Lonie Peak, MD on 07/22/2023 Stage prefix: Initial diagnosis Extraosseous extension: Absent   CHIEF COMPLAINT:  facial swelling  Narrative:   Pain issues, if any: Patient has some lower lip swelling and pain after excisions of skin lesions and this affected her mask tolerance last week. She was unable to start radiation to her R neck for that reason. The pain/swelling in lip does affect her when she eats solid foods. She does eat soft foods for breakfast she eats scrambled eggs. Her diet consists of protein such as chicken noodle soup, mashed potatoes, macaroni and cheese, applesauce. Using a feeding tube?: Patient denies Weight changes, if any: Patient has lost 12 pounds since her surgery in January. Swallowing issues, if any: Patient denies Smoking or chewing tobacco? Yes, patient smokes one puff a day but she does wear a nicotine patch. She is actively trying to quit. Using fluoride toothpaste daily? Yes Last ENT visit was on: Patient seen ENT on 08/07/2023. Other notable issues, if any: None    There were no vitals taken for this visit.   Wt Readings from Last 3 Encounters:  08/10/23 146 lb 4 oz (66.3 kg)  08/03/23 148 lb 9.4 oz (67.4 kg)  07/27/23 148 lb 9.6 oz (67.4 kg)     ALLERGIES:  is allergic to doxycycline and aquaphor [lanolin-petrolatum].  Meds: Current Outpatient Medications  Medication Sig Dispense Refill   Acetaminophen (TYLENOL  ARTHRITIS PAIN PO) Take by mouth as needed.     albuterol (VENTOLIN HFA) 108 (90 Base) MCG/ACT inhaler INHALE TWO PUFFS INTO THE LUNGS EVERY SIX HOURS AS NEEDED FOR WHEEZING OR SHORTNESS OF BREATH 18 g 3   amLODipine (NORVASC) 10 MG tablet Take 1 tablet (10 mg total) by mouth daily. 90 tablet 3   cephALEXin (KEFLEX) 500 MG capsule Take 1 capsule (500 mg total) by mouth 4 (four) times daily. 28 capsule 0   fluconazole (DIFLUCAN) 100 MG tablet Take 2 tablets today, then 1 tablet daily x 9 more days. 11 tablet 0   Fluticasone-Umeclidin-Vilant (TRELEGY ELLIPTA) 100-62.5-25 MCG/ACT AEPB Inhale 1 puff into the lungs daily. 1 each 11   No current facility-administered medications for this encounter.    Physical Findings: The patient is in no acute distress. Patient is alert and oriented. Wt Readings from Last 3 Encounters:  08/10/23 146 lb 4 oz (66.3 kg)  08/03/23 148 lb 9.4 oz (67.4 kg)  07/27/23 148 lb 9.6 oz (67.4 kg)    vitals were not taken for this visit. .  General: Alert and oriented, in no acute distress HEENT: Head is normocephalic. Extraocular movements are intact. Healing for lower lip surgery bilaterally, sutures in place Psychiatric: Judgment and insight are intact. Affect is appropriate.   Lab Findings: Lab Results  Component Value Date   WBC 8.0 04/10/2023   HGB 13.4 04/10/2023   HCT 40.3 04/10/2023   MCV 93  04/10/2023   PLT 179 04/10/2023    Lab Results  Component Value Date   TSH 0.702 09/12/2017    Radiographic Findings: No results found.  Impression/Plan:    Will hold radiation for one more week to allow pain/swelling in lip to settle  Right now the symptoms are too bothersome for her to tolerate the immobilization mask during radiation for R neck cancer.  On date of service, in total, I spent 20 minutes on this encounter. Patient was seen in person. _____________________________________   Lonie Peak, MD

## 2023-08-11 ENCOUNTER — Ambulatory Visit

## 2023-08-12 ENCOUNTER — Ambulatory Visit

## 2023-08-13 ENCOUNTER — Ambulatory Visit

## 2023-08-14 ENCOUNTER — Ambulatory Visit

## 2023-08-17 ENCOUNTER — Ambulatory Visit: Admission: RE | Admit: 2023-08-17 | Source: Ambulatory Visit | Admitting: Radiation Oncology

## 2023-08-17 ENCOUNTER — Ambulatory Visit
Admission: RE | Admit: 2023-08-17 | Discharge: 2023-08-17 | Disposition: A | Discharge status: Home / Self Care | Source: Ambulatory Visit | Attending: Radiation Oncology | Admitting: Radiation Oncology

## 2023-08-17 ENCOUNTER — Ambulatory Visit

## 2023-08-17 DIAGNOSIS — C44320 Squamous cell carcinoma of skin of unspecified parts of face: Secondary | ICD-10-CM

## 2023-08-17 DIAGNOSIS — C770 Secondary and unspecified malignant neoplasm of lymph nodes of head, face and neck: Secondary | ICD-10-CM | POA: Diagnosis not present

## 2023-08-17 NOTE — Progress Notes (Signed)
 Radiation Oncology         [phone] ________________________________  Name: Dawn Meyer MRN: [id number]  Date: 08/17/2023  DOB: 1955/06/21  Follow-Up Visit Note  CC: Tommie Sams, DO  Tommie Sams, DO  Diagnosis and Prior Radiotherapy:       ICD-10-CM   1. Squamous cell carcinoma of skin of face  C44.320        Cancer Staging  Squamous cell carcinoma of skin of face Staging form: Cutaneous Carcinoma of the Head and Neck, AJCC 8th Edition - Clinical stage from 07/22/2023: Stage II (cT2, cN0, cM0) - Signed by Lonie Peak, MD on 07/22/2023 Stage prefix: Initial diagnosis Extraosseous extension: Absent   CHIEF COMPLAINT:  not ready to start radiation   Narrative:    Patient reports she still has lip soreness affecting her eating as well as issues with her right eye.  She is not happy with how her skin healed from an aquaphor allergy. She wishes to f/u with her ENT surgeon and dermatologist this week, first.   She requests one more week of healing before starting radiation to her R neck.    There were no vitals taken for this visit.   Wt Readings from Last 3 Encounters:  08/10/23 146 lb 4 oz (66.3 kg)  08/03/23 148 lb 9.4 oz (67.4 kg)  07/27/23 148 lb 9.6 oz (67.4 kg)     ALLERGIES:  is allergic to doxycycline and aquaphor [lanolin-petrolatum].  Meds: Current Outpatient Medications  Medication Sig Dispense Refill   Acetaminophen (TYLENOL ARTHRITIS PAIN PO) Take by mouth as needed.     albuterol (VENTOLIN HFA) 108 (90 Base) MCG/ACT inhaler INHALE TWO PUFFS INTO THE LUNGS EVERY SIX HOURS AS NEEDED FOR WHEEZING OR SHORTNESS OF BREATH 18 g 3   amLODipine (NORVASC) 10 MG tablet Take 1 tablet (10 mg total) by mouth daily. 90 tablet 3   cephALEXin (KEFLEX) 500 MG capsule Take 1 capsule (500 mg total) by mouth 4 (four) times daily. 28 capsule 0   fluconazole (DIFLUCAN) 100 MG tablet Take 2 tablets today, then 1 tablet daily x 9 more days. 11 tablet 0    Fluticasone-Umeclidin-Vilant (TRELEGY ELLIPTA) 100-62.5-25 MCG/ACT AEPB Inhale 1 puff into the lungs daily. 1 each 11   No current facility-administered medications for this encounter.    Physical Findings: The patient is in no acute distress. Patient is alert and oriented. Wt Readings from Last 3 Encounters:  08/10/23 146 lb 4 oz (66.3 kg)  08/03/23 148 lb 9.4 oz (67.4 kg)  07/27/23 148 lb 9.6 oz (67.4 kg)    vitals were not taken for this visit. .  General: Alert and oriented, in no acute distress HEENT: Head is normocephalic. Extraocular movements are intact. Healing is satisfactory from lower lip surgery bilaterally. Watering of right eye with mild swelling of eyelids on right Skin: residual dry desquamation over right face/ neck from Aquaphor allergy  Psychiatric: Judgment and insight are intact. Affect is appropriate.   Lab Findings: Lab Results  Component Value Date   WBC 8.0 04/10/2023   HGB 13.4 04/10/2023   HCT 40.3 04/10/2023   MCV 93 04/10/2023   PLT 179 04/10/2023    Lab Results  Component Value Date   TSH 0.702 09/12/2017    Radiographic Findings: No results found.  Impression/Plan:    Will hold radiation for another week per patient's requests. She still has lip soreness as well as issues with her right eye.  She is not  happy with how her skin healed from an aquaphor allergy. She wishes to f/u with her ENT surgeon and dermatologist this week, first.   New Radiation start date: 3-31.  On date of service, in total, I spent 20 minutes on this encounter. Patient was seen in person. _____________________________________   Lonie Peak, MD

## 2023-08-18 ENCOUNTER — Ambulatory Visit

## 2023-08-19 ENCOUNTER — Ambulatory Visit

## 2023-08-20 ENCOUNTER — Ambulatory Visit

## 2023-08-20 DIAGNOSIS — Z08 Encounter for follow-up examination after completed treatment for malignant neoplasm: Secondary | ICD-10-CM | POA: Diagnosis not present

## 2023-08-20 DIAGNOSIS — L0100 Impetigo, unspecified: Secondary | ICD-10-CM | POA: Diagnosis not present

## 2023-08-20 DIAGNOSIS — Z85828 Personal history of other malignant neoplasm of skin: Secondary | ICD-10-CM | POA: Diagnosis not present

## 2023-08-21 ENCOUNTER — Ambulatory Visit

## 2023-08-24 ENCOUNTER — Ambulatory Visit

## 2023-08-24 ENCOUNTER — Ambulatory Visit
Admission: RE | Admit: 2023-08-24 | Discharge: 2023-08-24 | Disposition: A | Discharge status: Home / Self Care | Source: Ambulatory Visit | Attending: Radiation Oncology | Admitting: Radiation Oncology

## 2023-08-24 ENCOUNTER — Other Ambulatory Visit: Payer: Self-pay

## 2023-08-24 DIAGNOSIS — Z85828 Personal history of other malignant neoplasm of skin: Secondary | ICD-10-CM | POA: Diagnosis not present

## 2023-08-24 DIAGNOSIS — C44222 Squamous cell carcinoma of skin of right ear and external auricular canal: Secondary | ICD-10-CM | POA: Diagnosis not present

## 2023-08-24 DIAGNOSIS — Z510 Encounter for antineoplastic radiation therapy: Secondary | ICD-10-CM | POA: Diagnosis not present

## 2023-08-24 DIAGNOSIS — C44320 Squamous cell carcinoma of skin of unspecified parts of face: Secondary | ICD-10-CM

## 2023-08-24 DIAGNOSIS — C770 Secondary and unspecified malignant neoplasm of lymph nodes of head, face and neck: Secondary | ICD-10-CM | POA: Diagnosis not present

## 2023-08-24 LAB — RAD ONC ARIA SESSION SUMMARY
Course Elapsed Days: 0
Plan Fractions Treated to Date: 1
Plan Prescribed Dose Per Fraction: 2.5 Gy
Plan Total Fractions Prescribed: 20
Plan Total Prescribed Dose: 50 Gy
Reference Point Dosage Given to Date: 2.5 Gy
Reference Point Session Dosage Given: 2.5 Gy
Session Number: 1

## 2023-08-24 MED ORDER — RADIAPLEXRX EX GEL: Freq: Once | CUTANEOUS | Status: AC

## 2023-08-25 ENCOUNTER — Ambulatory Visit

## 2023-08-25 ENCOUNTER — Other Ambulatory Visit: Payer: Self-pay

## 2023-08-25 ENCOUNTER — Ambulatory Visit
Admission: RE | Admit: 2023-08-25 | Discharge: 2023-08-25 | Disposition: A | Discharge status: Home / Self Care | Source: Ambulatory Visit | Attending: Radiation Oncology | Admitting: Radiation Oncology

## 2023-08-25 DIAGNOSIS — C770 Secondary and unspecified malignant neoplasm of lymph nodes of head, face and neck: Secondary | ICD-10-CM | POA: Diagnosis not present

## 2023-08-25 DIAGNOSIS — Z510 Encounter for antineoplastic radiation therapy: Secondary | ICD-10-CM | POA: Diagnosis not present

## 2023-08-25 DIAGNOSIS — C44320 Squamous cell carcinoma of skin of unspecified parts of face: Secondary | ICD-10-CM | POA: Insufficient documentation

## 2023-08-25 DIAGNOSIS — C44222 Squamous cell carcinoma of skin of right ear and external auricular canal: Secondary | ICD-10-CM | POA: Diagnosis not present

## 2023-08-25 DIAGNOSIS — Z85828 Personal history of other malignant neoplasm of skin: Secondary | ICD-10-CM | POA: Diagnosis not present

## 2023-08-25 LAB — RAD ONC ARIA SESSION SUMMARY
Course Elapsed Days: 1
Plan Fractions Treated to Date: 2
Plan Prescribed Dose Per Fraction: 2.5 Gy
Plan Total Fractions Prescribed: 20
Plan Total Prescribed Dose: 50 Gy
Reference Point Dosage Given to Date: 5 Gy
Reference Point Session Dosage Given: 2.5 Gy
Session Number: 2

## 2023-08-26 ENCOUNTER — Other Ambulatory Visit: Payer: Self-pay

## 2023-08-26 ENCOUNTER — Ambulatory Visit
Admission: RE | Admit: 2023-08-26 | Discharge: 2023-08-26 | Disposition: A | Discharge status: Home / Self Care | Source: Ambulatory Visit | Attending: Radiation Oncology | Admitting: Radiation Oncology

## 2023-08-26 ENCOUNTER — Ambulatory Visit

## 2023-08-26 DIAGNOSIS — C770 Secondary and unspecified malignant neoplasm of lymph nodes of head, face and neck: Secondary | ICD-10-CM | POA: Diagnosis not present

## 2023-08-26 DIAGNOSIS — C44320 Squamous cell carcinoma of skin of unspecified parts of face: Secondary | ICD-10-CM | POA: Diagnosis not present

## 2023-08-26 DIAGNOSIS — Z510 Encounter for antineoplastic radiation therapy: Secondary | ICD-10-CM | POA: Diagnosis not present

## 2023-08-26 LAB — RAD ONC ARIA SESSION SUMMARY
Course Elapsed Days: 2
Plan Fractions Treated to Date: 3
Plan Prescribed Dose Per Fraction: 2.5 Gy
Plan Total Fractions Prescribed: 20
Plan Total Prescribed Dose: 50 Gy
Reference Point Dosage Given to Date: 7.5 Gy
Reference Point Session Dosage Given: 2.5 Gy
Session Number: 3

## 2023-08-27 ENCOUNTER — Ambulatory Visit
Admission: RE | Admit: 2023-08-27 | Discharge: 2023-08-27 | Disposition: A | Discharge status: Home / Self Care | Source: Ambulatory Visit | Attending: Radiation Oncology

## 2023-08-27 ENCOUNTER — Ambulatory Visit

## 2023-08-27 ENCOUNTER — Other Ambulatory Visit: Payer: Self-pay

## 2023-08-27 DIAGNOSIS — C770 Secondary and unspecified malignant neoplasm of lymph nodes of head, face and neck: Secondary | ICD-10-CM | POA: Diagnosis not present

## 2023-08-27 DIAGNOSIS — Z510 Encounter for antineoplastic radiation therapy: Secondary | ICD-10-CM | POA: Diagnosis not present

## 2023-08-27 DIAGNOSIS — C44320 Squamous cell carcinoma of skin of unspecified parts of face: Secondary | ICD-10-CM | POA: Diagnosis not present

## 2023-08-27 LAB — RAD ONC ARIA SESSION SUMMARY
Course Elapsed Days: 3
Plan Fractions Treated to Date: 4
Plan Prescribed Dose Per Fraction: 2.5 Gy
Plan Total Fractions Prescribed: 20
Plan Total Prescribed Dose: 50 Gy
Reference Point Dosage Given to Date: 10 Gy
Reference Point Session Dosage Given: 2.5 Gy
Session Number: 4

## 2023-08-28 ENCOUNTER — Other Ambulatory Visit: Payer: Self-pay

## 2023-08-28 ENCOUNTER — Ambulatory Visit

## 2023-08-28 ENCOUNTER — Ambulatory Visit
Admission: RE | Admit: 2023-08-28 | Discharge: 2023-08-28 | Disposition: A | Discharge status: Home / Self Care | Source: Ambulatory Visit | Attending: Radiation Oncology | Admitting: Radiation Oncology

## 2023-08-28 DIAGNOSIS — Z510 Encounter for antineoplastic radiation therapy: Secondary | ICD-10-CM | POA: Diagnosis not present

## 2023-08-28 DIAGNOSIS — C770 Secondary and unspecified malignant neoplasm of lymph nodes of head, face and neck: Secondary | ICD-10-CM | POA: Diagnosis not present

## 2023-08-28 DIAGNOSIS — C44222 Squamous cell carcinoma of skin of right ear and external auricular canal: Secondary | ICD-10-CM | POA: Diagnosis not present

## 2023-08-28 DIAGNOSIS — C44320 Squamous cell carcinoma of skin of unspecified parts of face: Secondary | ICD-10-CM | POA: Diagnosis not present

## 2023-08-28 DIAGNOSIS — Z85828 Personal history of other malignant neoplasm of skin: Secondary | ICD-10-CM | POA: Diagnosis not present

## 2023-08-28 LAB — RAD ONC ARIA SESSION SUMMARY
Course Elapsed Days: 4
Plan Fractions Treated to Date: 5
Plan Prescribed Dose Per Fraction: 2.5 Gy
Plan Total Fractions Prescribed: 20
Plan Total Prescribed Dose: 50 Gy
Reference Point Dosage Given to Date: 12.5 Gy
Reference Point Session Dosage Given: 2.5 Gy
Session Number: 5

## 2023-08-31 ENCOUNTER — Ambulatory Visit
Admission: RE | Admit: 2023-08-31 | Discharge: 2023-08-31 | Disposition: A | Discharge status: Home / Self Care | Source: Ambulatory Visit | Attending: Radiation Oncology

## 2023-08-31 ENCOUNTER — Other Ambulatory Visit: Payer: Self-pay | Admitting: Radiation Oncology

## 2023-08-31 ENCOUNTER — Ambulatory Visit

## 2023-08-31 ENCOUNTER — Inpatient Hospital Stay: Attending: Radiation Oncology

## 2023-08-31 ENCOUNTER — Telehealth: Payer: Self-pay | Admitting: *Deleted

## 2023-08-31 ENCOUNTER — Other Ambulatory Visit: Payer: Self-pay

## 2023-08-31 ENCOUNTER — Ambulatory Visit
Admission: RE | Admit: 2023-08-31 | Discharge: 2023-08-31 | Disposition: A | Discharge status: Home / Self Care | Source: Ambulatory Visit | Attending: Radiation Oncology | Admitting: Radiation Oncology

## 2023-08-31 DIAGNOSIS — C770 Secondary and unspecified malignant neoplasm of lymph nodes of head, face and neck: Secondary | ICD-10-CM | POA: Diagnosis not present

## 2023-08-31 DIAGNOSIS — Z510 Encounter for antineoplastic radiation therapy: Secondary | ICD-10-CM | POA: Diagnosis not present

## 2023-08-31 DIAGNOSIS — C44320 Squamous cell carcinoma of skin of unspecified parts of face: Secondary | ICD-10-CM | POA: Diagnosis not present

## 2023-08-31 DIAGNOSIS — Q828 Other specified congenital malformations of skin: Secondary | ICD-10-CM | POA: Diagnosis not present

## 2023-08-31 DIAGNOSIS — M216X1 Other acquired deformities of right foot: Secondary | ICD-10-CM | POA: Diagnosis not present

## 2023-08-31 LAB — RAD ONC ARIA SESSION SUMMARY
Course Elapsed Days: 7
Plan Fractions Treated to Date: 6
Plan Prescribed Dose Per Fraction: 2.5 Gy
Plan Total Fractions Prescribed: 20
Plan Total Prescribed Dose: 50 Gy
Reference Point Dosage Given to Date: 15 Gy
Reference Point Session Dosage Given: 2.5 Gy
Session Number: 6

## 2023-08-31 MED ORDER — LORAZEPAM 0.5 MG PO TABS: 0.5000 mg | ORAL_TABLET | Freq: Three times a day (TID) | ORAL | 0 refills | Status: DC | PRN

## 2023-08-31 NOTE — Progress Notes (Signed)
 Nutrition  Patient scheduled to see nutrition following radiation treatment today but patient did not show up for nutrition visit.    Message sent to scheduling to offer another appointment. Message sent to referring provider as well.    Melyssa Signor B. Elease Hashimoto, CSO, LDN Registered Dietitian [phone]

## 2023-08-31 NOTE — Telephone Encounter (Signed)
 CALLED PATIENT TO ASK ABOUT SEEING NUTRITIONIST, PATIENT AGREED TO A 2:30 PM PHONE CALL TOMORROW (09/01/23), APPT. HAS BEEN BOOKED

## 2023-09-01 ENCOUNTER — Other Ambulatory Visit: Payer: Self-pay

## 2023-09-01 ENCOUNTER — Ambulatory Visit
Admission: RE | Admit: 2023-09-01 | Discharge: 2023-09-01 | Disposition: A | Discharge status: Home / Self Care | Source: Ambulatory Visit | Attending: Radiation Oncology

## 2023-09-01 ENCOUNTER — Ambulatory Visit

## 2023-09-01 ENCOUNTER — Ambulatory Visit: Payer: Medicare PPO | Admitting: Podiatry

## 2023-09-01 ENCOUNTER — Encounter: Payer: Self-pay | Admitting: Podiatry

## 2023-09-01 ENCOUNTER — Inpatient Hospital Stay: Admitting: Nutrition

## 2023-09-01 DIAGNOSIS — M216X1 Other acquired deformities of right foot: Secondary | ICD-10-CM

## 2023-09-01 DIAGNOSIS — Q828 Other specified congenital malformations of skin: Secondary | ICD-10-CM | POA: Diagnosis not present

## 2023-09-01 DIAGNOSIS — Z510 Encounter for antineoplastic radiation therapy: Secondary | ICD-10-CM | POA: Diagnosis not present

## 2023-09-01 LAB — RAD ONC ARIA SESSION SUMMARY
Course Elapsed Days: 8
Plan Fractions Treated to Date: 7
Plan Prescribed Dose Per Fraction: 2.5 Gy
Plan Total Fractions Prescribed: 20
Plan Total Prescribed Dose: 50 Gy
Reference Point Dosage Given to Date: 17.5 Gy
Reference Point Session Dosage Given: 2.5 Gy
Session Number: 7

## 2023-09-01 NOTE — Progress Notes (Signed)
 Telephone consult completed with patient.  68 yo female diagnosed with basal cell cancer of the skin. She is scheduled for her final radiation treatment on Monday, April 14.  PMH includes COPD, GERD, HTN, RA, Tobacco.  Medications include diflucan and Ativan.  Labs include glucose of 110.  Height: 5' 5.5" Weight: 144.6 pounds on March 31 Patient weighed 148 pounds March 3. Usual body weight: Approximately 150 pounds. BMI: 23.7  Patient has had multiple surgeries since January resulting in difficulty eating and associated weight loss.  She states she is doing much better now.  She finds it difficult to think of things she would want to eat for breakfast.  She used to like eggs a lot but has eaten so many that she no longer wants them.  She is tolerating textures such as toast and fresh fruit.  Yesterday she ate a hamburger happy meal.  She thinks recent prescription for Ativan has helped tremendously.  Nutrition diagnosis: Unintended weight loss related to cancer and associated treatments as evidenced by approximate 6 pound weight loss from usual body weight.  Intervention: Educated to consume smaller more frequent meals and snacks and adjust textures as tolerated. Consider oral nutrition supplements between meals. Reviewed snack suggestions.  Will email nutrition fact sheet sent to patient's home email address. Provided support and encouragement.  Contact information was given.  Monitoring, evaluation, goals: Patient will tolerate adequate calories and protein to minimize weight loss.  No follow-up is scheduled at this time.  Patient encouraged to contact RD with questions or concerns.  **Disclaimer: This note was dictated with voice recognition software. Similar sounding words can inadvertently be transcribed and this note may contain transcription errors which may not have been corrected upon publication of note.**

## 2023-09-02 ENCOUNTER — Ambulatory Visit

## 2023-09-02 ENCOUNTER — Other Ambulatory Visit: Payer: Self-pay

## 2023-09-02 ENCOUNTER — Ambulatory Visit
Admission: RE | Admit: 2023-09-02 | Discharge: 2023-09-02 | Discharge status: Home / Self Care | Source: Ambulatory Visit | Attending: Radiation Oncology

## 2023-09-02 DIAGNOSIS — Z510 Encounter for antineoplastic radiation therapy: Secondary | ICD-10-CM | POA: Diagnosis not present

## 2023-09-02 DIAGNOSIS — C770 Secondary and unspecified malignant neoplasm of lymph nodes of head, face and neck: Secondary | ICD-10-CM | POA: Diagnosis not present

## 2023-09-02 DIAGNOSIS — C44222 Squamous cell carcinoma of skin of right ear and external auricular canal: Secondary | ICD-10-CM | POA: Diagnosis not present

## 2023-09-02 DIAGNOSIS — C44320 Squamous cell carcinoma of skin of unspecified parts of face: Secondary | ICD-10-CM | POA: Diagnosis not present

## 2023-09-02 DIAGNOSIS — Z85828 Personal history of other malignant neoplasm of skin: Secondary | ICD-10-CM | POA: Diagnosis not present

## 2023-09-02 LAB — RAD ONC ARIA SESSION SUMMARY
Course Elapsed Days: 9
Plan Fractions Treated to Date: 1
Plan Prescribed Dose Per Fraction: 6 Gy
Plan Total Fractions Prescribed: 3
Plan Total Prescribed Dose: 18 Gy
Reference Point Dosage Given to Date: 6 Gy
Reference Point Session Dosage Given: 6 Gy
Session Number: 8

## 2023-09-03 ENCOUNTER — Ambulatory Visit

## 2023-09-03 NOTE — Progress Notes (Signed)
 Subjective: Chief Complaint  Patient presents with   RFC    RM#13 RFC      68 year old female presents the office with concern of bilateral foot pain, callus formation.  She said the calluses are starting to come painful again.  No open lesions.  Objective: AAO x3, NAD DP/PT pulses palpable bilaterally, CRT less than 3 seconds Hyperkeratotic lesions noted submetatarsal 3 and medial 2nd toe on the right foot as well as left plantar hallux.  There is callus formation present submetatarsal 5 on the left foot.  There is no underlying ulceration drainage or any signs of infection noted today.  Prominent metatarsal head.  Bunion noted.   No pain with calf compression, swelling, warmth, erythema  Assessment: Hyperkeratotic lesions to digital deformity, prominent metatarsal heads  Plan: -All treatment options discussed with the patient including all alternatives, risks, complications.  -Sharply debrided the hyperkeratotic lesions with any complications x 4.  Continue moisturizer and offloading daily.  -Patient encouraged to call the office with any questions, concerns, change in symptoms.   Return in about 3 months (around 12/01/2023).  Vivi Barrack DPM

## 2023-09-04 ENCOUNTER — Ambulatory Visit

## 2023-09-04 ENCOUNTER — Ambulatory Visit
Admission: RE | Admit: 2023-09-04 | Discharge: 2023-09-04 | Disposition: A | Discharge status: Home / Self Care | Source: Ambulatory Visit | Attending: Radiation Oncology | Admitting: Radiation Oncology

## 2023-09-04 ENCOUNTER — Other Ambulatory Visit: Payer: Self-pay

## 2023-09-04 DIAGNOSIS — C770 Secondary and unspecified malignant neoplasm of lymph nodes of head, face and neck: Secondary | ICD-10-CM | POA: Diagnosis not present

## 2023-09-04 DIAGNOSIS — Z510 Encounter for antineoplastic radiation therapy: Secondary | ICD-10-CM | POA: Diagnosis not present

## 2023-09-04 DIAGNOSIS — C44320 Squamous cell carcinoma of skin of unspecified parts of face: Secondary | ICD-10-CM | POA: Diagnosis not present

## 2023-09-04 LAB — RAD ONC ARIA SESSION SUMMARY
Course Elapsed Days: 11
Plan Fractions Treated to Date: 2
Plan Prescribed Dose Per Fraction: 6 Gy
Plan Total Fractions Prescribed: 3
Plan Total Prescribed Dose: 18 Gy
Reference Point Dosage Given to Date: 12 Gy
Reference Point Session Dosage Given: 6 Gy
Session Number: 9

## 2023-09-07 ENCOUNTER — Ambulatory Visit
Admission: RE | Admit: 2023-09-07 | Discharge: 2023-09-07 | Disposition: A | Discharge status: Home / Self Care | Source: Ambulatory Visit | Attending: Radiation Oncology | Admitting: Radiation Oncology

## 2023-09-07 ENCOUNTER — Ambulatory Visit

## 2023-09-07 ENCOUNTER — Other Ambulatory Visit: Payer: Self-pay

## 2023-09-07 ENCOUNTER — Other Ambulatory Visit: Payer: Self-pay | Admitting: Radiation Oncology

## 2023-09-07 DIAGNOSIS — Z85828 Personal history of other malignant neoplasm of skin: Secondary | ICD-10-CM | POA: Diagnosis not present

## 2023-09-07 DIAGNOSIS — C770 Secondary and unspecified malignant neoplasm of lymph nodes of head, face and neck: Secondary | ICD-10-CM

## 2023-09-07 DIAGNOSIS — Z510 Encounter for antineoplastic radiation therapy: Secondary | ICD-10-CM | POA: Diagnosis not present

## 2023-09-07 DIAGNOSIS — C44222 Squamous cell carcinoma of skin of right ear and external auricular canal: Secondary | ICD-10-CM | POA: Diagnosis not present

## 2023-09-07 DIAGNOSIS — C44320 Squamous cell carcinoma of skin of unspecified parts of face: Secondary | ICD-10-CM | POA: Diagnosis not present

## 2023-09-07 LAB — RAD ONC ARIA SESSION SUMMARY
Course Elapsed Days: 14
Plan Fractions Treated to Date: 3
Plan Prescribed Dose Per Fraction: 6 Gy
Plan Total Fractions Prescribed: 3
Plan Total Prescribed Dose: 18 Gy
Reference Point Dosage Given to Date: 18 Gy
Reference Point Session Dosage Given: 6 Gy
Session Number: 10

## 2023-09-07 MED ORDER — LIDOCAINE VISCOUS HCL 2 % MT SOLN: OROMUCOSAL | 2 refills | Status: DC

## 2023-09-07 NOTE — Progress Notes (Signed)
 Oncology Nurse Navigator Documentation   Met with Ms. Kneisley after final RT to offer support and to celebrate end of radiation treatment.   Provided verbal post-RT guidance: Importance of keeping all follow-up appts. Continuation of Sonafine application 2-3 times daily, application of antibiotic ointment to areas of raw skin; when supply of Sonafine exhausted transition to OTC lotion with vitamin E.  Explained my role as navigator will continue for several more months, encouraged him to call me with needs/concerns.    Lynetta Saran RN, BSN, OCN Head & Neck Oncology Nurse Navigator [facility] Phone # [phone]  Fax # [phone]

## 2023-09-08 ENCOUNTER — Ambulatory Visit

## 2023-09-08 NOTE — Radiation Completion Notes (Signed)
 Patient Name: Dawn Meyer, Dawn Meyer MRN: [id number] Date of Birth: September 12, 1955 Referring Physician: Kathleen Papa, M.D. Date of Service: 2023-09-08 Radiation Oncologist: Colie Dawes, M.D. [facility]                             RADIATION ONCOLOGY END OF TREATMENT NOTE     Diagnosis: C44.320 Squamous cell carcinoma of skin of unspecified parts of face Staging on 2023-07-22: Squamous cell carcinoma of skin of face T=cT2, N=cN0, M=cM0 Intent: Curative     ==========DELIVERED PLANS==========  First Treatment Date: 2023-08-24 Last Treatment Date: 2023-09-07   Plan Name: HN_R_PreAuri Site: Neck Right Technique: IMRT Mode: Photon Dose Per Fraction: 2.5 Gy Prescribed Dose (Delivered / Prescribed): 17.5 Gy / 17.5 Gy Prescribed Fxs (Delivered / Prescribed): 7 / 7   Plan Name: HN_R_PreAur:1 Site: Neck Right Technique: IMRT Mode: Photon Dose Per Fraction: 6 Gy Prescribed Dose (Delivered / Prescribed): 18 Gy / 18 Gy Prescribed Fxs (Delivered / Prescribed): 3 / 3     ==========ON TREATMENT VISIT DATES========== 2023-08-24, 2023-08-31, 2023-09-07     ==========UPCOMING VISITS========== 11/11/2023 [facility] OFFICE VISIT Cook, Jayce G, DO  09/24/2023 [facility] OFFICE VISIT Nicholas Bari, MD  09/22/2023 [facility] FOLLOW UP 20 Pearlene Bouchard, PA-C        ==========APPENDIX - ON TREATMENT VISIT NOTES==========   See weekly On Treatment Notes in Epic for details in the Media tab (listed as Progress notes on the On Treatment Visit Dates listed above).

## 2023-09-09 ENCOUNTER — Ambulatory Visit

## 2023-09-10 ENCOUNTER — Ambulatory Visit

## 2023-09-11 ENCOUNTER — Ambulatory Visit

## 2023-09-11 DIAGNOSIS — H0223A Paralytic lagophthalmos right eye, upper and lower eyelids: Secondary | ICD-10-CM | POA: Diagnosis not present

## 2023-09-11 DIAGNOSIS — M952 Other acquired deformity of head: Secondary | ICD-10-CM | POA: Diagnosis not present

## 2023-09-11 DIAGNOSIS — G510 Bell's palsy: Secondary | ICD-10-CM | POA: Diagnosis not present

## 2023-09-11 DIAGNOSIS — C4402 Squamous cell carcinoma of skin of lip: Secondary | ICD-10-CM | POA: Diagnosis not present

## 2023-09-11 DIAGNOSIS — H6121 Impacted cerumen, right ear: Secondary | ICD-10-CM | POA: Diagnosis not present

## 2023-09-14 ENCOUNTER — Ambulatory Visit

## 2023-09-15 ENCOUNTER — Ambulatory Visit

## 2023-09-15 ENCOUNTER — Emergency Department (HOSPITAL_COMMUNITY)

## 2023-09-15 ENCOUNTER — Encounter (HOSPITAL_COMMUNITY): Payer: Self-pay

## 2023-09-15 ENCOUNTER — Other Ambulatory Visit: Payer: Self-pay

## 2023-09-15 ENCOUNTER — Emergency Department (HOSPITAL_COMMUNITY)
Admission: EM | Admit: 2023-09-15 | Discharge: 2023-09-15 | Disposition: A | Discharge status: Home / Self Care | Attending: Emergency Medicine | Admitting: Emergency Medicine

## 2023-09-15 DIAGNOSIS — I10 Essential (primary) hypertension: Secondary | ICD-10-CM | POA: Insufficient documentation

## 2023-09-15 DIAGNOSIS — J449 Chronic obstructive pulmonary disease, unspecified: Secondary | ICD-10-CM | POA: Diagnosis not present

## 2023-09-15 DIAGNOSIS — R531 Weakness: Secondary | ICD-10-CM | POA: Diagnosis not present

## 2023-09-15 DIAGNOSIS — Z7951 Long term (current) use of inhaled steroids: Secondary | ICD-10-CM | POA: Insufficient documentation

## 2023-09-15 DIAGNOSIS — F419 Anxiety disorder, unspecified: Secondary | ICD-10-CM | POA: Insufficient documentation

## 2023-09-15 DIAGNOSIS — R0602 Shortness of breath: Secondary | ICD-10-CM | POA: Diagnosis not present

## 2023-09-15 DIAGNOSIS — Z79899 Other long term (current) drug therapy: Secondary | ICD-10-CM | POA: Insufficient documentation

## 2023-09-15 DIAGNOSIS — I2510 Atherosclerotic heart disease of native coronary artery without angina pectoris: Secondary | ICD-10-CM | POA: Diagnosis not present

## 2023-09-15 DIAGNOSIS — Z85828 Personal history of other malignant neoplasm of skin: Secondary | ICD-10-CM | POA: Diagnosis not present

## 2023-09-15 DIAGNOSIS — J432 Centrilobular emphysema: Secondary | ICD-10-CM | POA: Diagnosis not present

## 2023-09-15 DIAGNOSIS — I491 Atrial premature depolarization: Secondary | ICD-10-CM | POA: Diagnosis not present

## 2023-09-15 LAB — CBC WITH DIFFERENTIAL/PLATELET
Abs Immature Granulocytes: 0.03 10*3/uL (ref 0.00–0.07)
Basophils Absolute: 0 10*3/uL (ref 0.0–0.1)
Basophils Relative: 0 %
Eosinophils Absolute: 0 10*3/uL (ref 0.0–0.5)
Eosinophils Relative: 0 %
HCT: 39.7 % (ref 36.0–46.0)
Hemoglobin: 13.1 g/dL (ref 12.0–15.0)
Immature Granulocytes: 1 %
Lymphocytes Relative: 10 %
Lymphs Abs: 0.7 10*3/uL (ref 0.7–4.0)
MCH: 31.3 pg (ref 26.0–34.0)
MCHC: 33 g/dL (ref 30.0–36.0)
MCV: 94.7 fL (ref 80.0–100.0)
Monocytes Absolute: 0.1 10*3/uL (ref 0.1–1.0)
Monocytes Relative: 2 %
Neutro Abs: 5.5 10*3/uL (ref 1.7–7.7)
Neutrophils Relative %: 87 %
Platelets: 132 10*3/uL — ABNORMAL LOW (ref 150–400)
RBC: 4.19 MIL/uL (ref 3.87–5.11)
RDW: 13.1 % (ref 11.5–15.5)
WBC: 6.4 10*3/uL (ref 4.0–10.5)
nRBC: 0 % (ref 0.0–0.2)

## 2023-09-15 LAB — COMPREHENSIVE METABOLIC PANEL WITH GFR
ALT: 11 U/L (ref 0–44)
AST: 13 U/L — ABNORMAL LOW (ref 15–41)
Albumin: 3.7 g/dL (ref 3.5–5.0)
Alkaline Phosphatase: 101 U/L (ref 38–126)
Anion gap: 10 (ref 5–15)
BUN: 13 mg/dL (ref 8–23)
CO2: 20 mmol/L — ABNORMAL LOW (ref 22–32)
Calcium: 9.3 mg/dL (ref 8.9–10.3)
Chloride: 107 mmol/L (ref 98–111)
Creatinine, Ser: 0.77 mg/dL (ref 0.44–1.00)
GFR, Estimated: >60 mL/min (ref >60)
Glucose, Bld: 134 mg/dL — ABNORMAL HIGH (ref 70–99)
Potassium: 3.5 mmol/L (ref 3.5–5.1)
Sodium: 137 mmol/L (ref 135–145)
Total Bilirubin: 0.8 mg/dL (ref 0.0–1.2)
Total Protein: 6.9 g/dL (ref 6.5–8.1)

## 2023-09-15 LAB — TROPONIN I (HIGH SENSITIVITY)
Troponin I (High Sensitivity): 3 ng/L (ref <18)
Troponin I (High Sensitivity): 2 ng/L (ref <18)

## 2023-09-15 LAB — RESP PANEL BY RT-PCR (RSV, FLU A&B, COVID)  RVPGX2
Influenza A by PCR: NEGATIVE
Influenza B by PCR: NEGATIVE
Resp Syncytial Virus by PCR: NEGATIVE
SARS Coronavirus 2 by RT PCR: NEGATIVE

## 2023-09-15 LAB — MAGNESIUM: Magnesium: 2.2 mg/dL (ref 1.7–2.4)

## 2023-09-15 LAB — BRAIN NATRIURETIC PEPTIDE: B Natriuretic Peptide: 66 pg/mL (ref 0.0–100.0)

## 2023-09-15 LAB — D-DIMER, QUANTITATIVE: D-Dimer, Quant: 1.32 ug{FEU}/mL — ABNORMAL HIGH (ref 0.00–0.50)

## 2023-09-15 MED ORDER — CLONAZEPAM 0.5 MG PO TABS
0.5000 mg | ORAL_TABLET | Freq: Once | ORAL | Status: AC
  Administered 2023-09-15: 0.5 mg via ORAL
  Filled 2023-09-15: qty 1

## 2023-09-15 MED ORDER — IOHEXOL 350 MG/ML SOLN
75.0000 mL | Freq: Once | INTRAVENOUS | Status: AC | PRN
  Administered 2023-09-15: 75 mL via INTRAVENOUS

## 2023-09-15 MED ORDER — IPRATROPIUM-ALBUTEROL 0.5-2.5 (3) MG/3ML IN SOLN
3.0000 mL | Freq: Once | RESPIRATORY_TRACT | Status: AC
  Administered 2023-09-15: 3 mL via RESPIRATORY_TRACT
  Filled 2023-09-15: qty 3

## 2023-09-15 MED ORDER — CLONAZEPAM 0.5 MG PO TABS: 0.5000 mg | ORAL_TABLET | Freq: Two times a day (BID) | ORAL | 0 refills | Status: DC | PRN

## 2023-09-15 MED ORDER — LACTATED RINGERS IV BOLUS
1000.0000 mL | Freq: Once | INTRAVENOUS | Status: AC
  Administered 2023-09-15: 1000 mL via INTRAVENOUS

## 2023-09-15 MED ORDER — FLUOXETINE HCL 10 MG PO TABS: 10.0000 mg | ORAL_TABLET | Freq: Every day | ORAL | 0 refills | Status: DC

## 2023-09-15 NOTE — ED Notes (Signed)
Walked with pt to bathroom and back to room

## 2023-09-15 NOTE — ED Provider Notes (Signed)
 I was asked to follow-up on the patient's labs with the plan being for discharge if her troponins are negative.  Patient's troponins are negative.  CT scan does show some coronary artery disease but the patient has had the 2 troponins that are negative so this does not appear to be acute.  She is feeling better on reassessment.  She is still feeling anxious.  The patient is given Klonopin .  Dr. Kermit Ped already sent a prescription for Prozac .  The patient is encouraged to start this.  She is given a short course of Klonopin  to take over the first few days with the SSRI as anxiety can get worse in the first few days while starting these medications.  She is discharged with return precautions. Physical Exam  BP (!) 142/69 (BP Location: Right Arm)   Pulse 68   Temp 98.3 F (36.8 C) (Oral)   Resp 18   Ht 5' 5.5" (1.664 m)   Wt 63 kg   SpO2 99%   BMI 22.78 kg/m   Physical Exam General: No acute distress  Procedures  Procedures  ED Course / MDM    Medical Decision Making Amount and/or Complexity of Data Reviewed Labs: ordered. Radiology: ordered.  Risk Prescription drug management.          Dawn Charleston, MD 09/15/23 [phone]

## 2023-09-15 NOTE — Progress Notes (Deleted)
 Office Visit Note  Patient: Dawn Meyer             Date of Birth: [birth year]           MRN: [id number]             PCP: Cook, Jayce G, DO Referring: Cook, Jayce G, DO Visit Date: 09/24/2023 Occupation: @GUAROCC @  Subjective:  No chief complaint on file.   History of Present Illness: Dawn Meyer is a 68 y.o. female ***     Activities of Daily Living:  Patient reports morning stiffness for *** {minute/hour:19697}.   Patient {ACTIONS;DENIES/REPORTS:21021675::"Denies"} nocturnal pain.  Difficulty dressing/grooming: {ACTIONS;DENIES/REPORTS:21021675::"Denies"} Difficulty climbing stairs: {ACTIONS;DENIES/REPORTS:21021675::"Denies"} Difficulty getting out of chair: {ACTIONS;DENIES/REPORTS:21021675::"Denies"} Difficulty using hands for taps, buttons, cutlery, and/or writing: {ACTIONS;DENIES/REPORTS:21021675::"Denies"}  No Rheumatology ROS completed.   PMFS History:  Patient Active Problem List   Diagnosis Date Noted   Squamous cell carcinoma of skin of face 07/22/2023   Acquired facial deformity 06/24/2023   Hyperlipidemia 05/14/2023   COPD (chronic obstructive pulmonary disease) (HCC) 07/02/2022   Essential hypertension 10/10/2021   Allergic rhinitis 11/17/2017   Tobacco abuse 11/17/2017   Rheumatoid arthritis involving multiple sites with positive rheumatoid factor (HCC) 08/18/2016   OP (osteoporosis) 08/18/2016    Past Medical History:  Diagnosis Date   Asthma    COPD (chronic obstructive pulmonary disease) (HCC)    GERD (gastroesophageal reflux disease)    Hypertension    Prolapsed uterus    per patient, dx by OB-GYN   RA (rheumatoid arthritis) (HCC)    Smoker    1/4/ppd    Family History  Problem Relation Age of Onset   Congestive Heart Failure Mother    Heart attack Mother    Diabetes Father    Heart Problems Father        pacemaker   Diverticulitis Sister    Hypertension Sister    Heart attack Brother    Healthy Daughter    Colon cancer  Neg Hx    Past Surgical History:  Procedure Laterality Date   ADJACENT TISSUE TRANSFER/TISSUE REARRANGEMENT Right 06/24/2023   Procedure: ADJACENT TISSUE TRANSFER/TISSUE REARRANGEMENT;  Surgeon: Rush Coupe, MD;  Location: [facility];  Service: ENT;  Laterality: Right;   COLONOSCOPY WITH PROPOFOL  N/A 08/09/2021   Procedure: COLONOSCOPY WITH PROPOFOL ;  Surgeon: Suzette Espy, MD;  [facility];  Service: Endoscopy;  Laterality: N/A;  7:30am   EXCISION MASS HEAD Right 06/24/2023   Procedure: EXCISION AND REPAIR OF RIGHT CHEEK MOHS DEFECT;  Surgeon: Rush Coupe, MD;  Location: [facility];  Service: ENT;  Laterality: Right;   EXCISION MASS HEAD Bilateral 08/03/2023   Procedure: 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; with primary closure;  Surgeon: Rush Coupe, MD;  Location: [facility];  Service: ENT;  Laterality: Bilateral;  Lower Lip   [place] procedure  04/28/2022   for prolapsed cervix   MOHS SURGERY     SQUAMOUS CELL CARCINOMA EXCISION     VAGINAL DELIVERY     Social History   Social History Narrative   Not on file   Immunization History  Administered Date(s) Administered   Fluad Trivalent(High Dose 65+) 03/09/2023   Influenza Inj Mdck Quad Pf 03/12/2017   Influenza,inj,Quad PF,6+ Mos 02/14/2019   Influenza-Unspecified 03/20/2016, 03/11/2018, 03/13/2020, 03/16/2021, 03/19/2022   Janssen (J&J) SARS-COV-2 Vaccination 08/31/2019   Td 08/28/2009     Objective: Vital Signs: There were no vitals taken for this  visit.   Physical Exam   Musculoskeletal Exam: ***  CDAI Exam: CDAI Score: -- Patient Global: --; Provider Global: -- Swollen: --; Tender: -- Joint Exam 09/24/2023   No joint exam has been documented for this visit   There is currently no information documented on the homunculus. Go to the Rheumatology activity and complete the homunculus joint exam.  Investigation: No additional  findings.  Imaging: No results found.  Recent Labs: Lab Results  Component Value Date   WBC 8.0 04/10/2023   HGB 13.4 04/10/2023   PLT 179 04/10/2023   NA 140 07/27/2023   K 3.6 07/27/2023   CL 109 07/27/2023   CO2 25 07/27/2023   GLUCOSE 110 (H) 07/27/2023   BUN 10 07/27/2023   CREATININE 0.80 07/27/2023   BILITOT 0.5 07/27/2023   ALKPHOS 117 07/27/2023   AST 9 (L) 07/27/2023   ALT 8 07/27/2023   PROT 7.0 07/27/2023   ALBUMIN 4.1 07/27/2023   CALCIUM 9.0 07/27/2023   GFRAA 78 03/13/2020   QFTBGOLD Negative 07/29/2016   QFTBGOLDPLUS Negative 11/11/2022    Speciality Comments: Osteoporosis manged by Dr. Alvira Josephs.  She declines treatment-ACY 01/11/2019  Procedures:  No procedures performed Allergies: Doxycycline  and Aquaphor [lanolin-petrolatum ]   Assessment / Plan:     Visit Diagnoses: No diagnosis found.  Orders: No orders of the defined types were placed in this encounter.  No orders of the defined types were placed in this encounter.   Face-to-face time spent with patient was *** minutes. Greater than 50% of time was spent in counseling and coordination of care.  Follow-Up Instructions: No follow-ups on file.   Dee Farber, CMA  Note - This record has been created using Animal nutritionist.  Chart creation errors have been sought, but may not always  have been located. Such creation errors do not reflect on  the standard of medical care.

## 2023-09-15 NOTE — Discharge Instructions (Addendum)
 Start on the Prozac .  Take the Klonopin  as needed up to twice daily over the next few days.  Do not drive or drink alcohol taking this as may make you drowsy.  Follow-up with your doctor as scheduled.  I have placed a referral to our cardiology team for the coronary artery disease seen on your CT scan.  There were no blood clots.  Please follow-up as discussed.  Return to the ER for worsening symptoms.

## 2023-09-15 NOTE — ED Triage Notes (Signed)
 BIB [facility] for SOB and generalized weakness. EMS stated pt was on non rebreathe when they arrived and when taken off O2 her sats stayed at 100% but they applied 2L O2 for pt comfort. Pt has had 4 surgeries this year and took last radiation tx last Monday per pt. CBG 248 per EMS, VS WNL. Pt in ED now on RA at 100%

## 2023-09-15 NOTE — ED Provider Notes (Signed)
 [place] EMERGENCY DEPARTMENT AT [facility] Provider Note   CSN: [id number] Arrival date & time: 09/15/23  1220     History  Chief Complaint  Patient presents with   Shortness of Breath    Dawn Meyer is a 68 y.o. female.   Shortness of Breath Patient presents for shortness of breath.  Medical history includes COPD, GERD, HTN, rheumatoid arthritis, squamous cell carcinoma of face.  Over the past several months, she has undergone multiple surgeries for her facial skin cancer.  She completed radiation therapy a week ago.  She is not on any chemotherapy.  She was previously on Ativan  for anxiolysis throughout her radiation.  She stopped taking this medication a week ago.  She feels that she has been more anxious and jittery.  She feels that she has decreased motivation and anhedonia.  She has had poor p.o. intake due to loss of appetite.  Today, she had increased anxiety and associated shortness of breath.  The symptoms have improved.  Per EMS, she was on a nonrebreather when they arrived on scene.  She was taken off of it and had normal SpO2 on room air.  She was placed on 2 L of supplemental oxygen for comfort.  She is not on oxygen at baseline.  For her COPD, she has Trelegy inhaler and albuterol  inhaler as needed.     Home Medications Prior to Admission medications   Medication Sig Start Date End Date Taking? Authorizing Provider  FLUoxetine  (PROZAC ) 10 MG tablet Take 1 tablet (10 mg total) by mouth daily. 09/15/23 10/15/23 Yes Iva Mariner, MD  Acetaminophen  (TYLENOL  ARTHRITIS PAIN PO) Take by mouth as needed.    [provider]  albuterol  (VENTOLIN  HFA) 108 (90 Base) MCG/ACT inhaler INHALE TWO PUFFS INTO THE LUNGS EVERY SIX HOURS AS NEEDED FOR WHEEZING OR SHORTNESS OF BREATH 05/13/23   Cook, Jayce G, DO  amLODipine  (NORVASC ) 10 MG tablet Take 1 tablet (10 mg total) by mouth daily. 11/11/22   Cook, Jayce G, DO  cephALEXin  (KEFLEX ) 500 MG capsule Take 1 capsule  (500 mg total) by mouth 4 (four) times daily. Patient not taking: Reported on 09/01/2023 07/22/23   Colie Dawes, MD  fluconazole  (DIFLUCAN ) 100 MG tablet Take 2 tablets today, then 1 tablet daily x 9 more days. Patient not taking: Reported on 09/01/2023 07/22/23   Colie Dawes, MD  Fluticasone -Umeclidin-Vilant (TRELEGY ELLIPTA ) 100-62.5-25 MCG/ACT AEPB Inhale 1 puff into the lungs daily. 05/13/23   Cook, Jayce G, DO  lidocaine  (XYLOCAINE ) 2 % solution Patient: Mix 1part 2% viscous lidocaine , 1part H20. Swish & swallow 10mL of diluted mixture, 30min before meals and at bedtime, up to QID 09/07/23   Colie Dawes, MD  LORazepam  (ATIVAN ) 0.5 MG tablet Take 1 tablet (0.5 mg total) by mouth every 8 (eight) hours as needed for anxiety. 08/31/23   Colie Dawes, MD      Allergies    Doxycycline  and Aquaphor [lanolin-petrolatum ]    Review of Systems   Review of Systems  Constitutional:  Positive for appetite change, fatigue and unexpected weight change.  Respiratory:  Positive for chest tightness and shortness of breath.   Neurological:  Positive for weakness (Generalized).  Psychiatric/Behavioral:  The patient is nervous/anxious.     Physical Exam Updated Vital Signs BP (!) 142/69 (BP Location: Right Arm)   Pulse 68   Temp 98.3 F (36.8 C) (Oral)   Resp 18   Ht 5' 5.5" (1.664 m)   Wt 63 kg  SpO2 99%   BMI 22.78 kg/m  Physical Exam Vitals and nursing note reviewed.  Constitutional:      General: She is not in acute distress.    Appearance: She is well-developed and normal weight. She is not ill-appearing, toxic-appearing or diaphoretic.  HENT:     Head: Normocephalic and atraumatic.     Mouth/Throat:     Mouth: Mucous membranes are moist.  Eyes:     Extraocular Movements: Extraocular movements intact.     Conjunctiva/sclera: Conjunctivae normal.  Cardiovascular:     Rate and Rhythm: Normal rate and regular rhythm.     Heart sounds: No murmur heard. Pulmonary:     Effort: Pulmonary  effort is normal. No tachypnea, accessory muscle usage or respiratory distress.     Breath sounds: Decreased breath sounds present.  Chest:     Chest wall: No tenderness.  Abdominal:     Palpations: Abdomen is soft.     Tenderness: There is no abdominal tenderness.  Musculoskeletal:        General: No swelling. Normal range of motion.     Cervical back: Normal range of motion and neck supple.  Skin:    General: Skin is warm and dry.     Coloration: Skin is not cyanotic or pale.  Neurological:     General: No focal deficit present.     Mental Status: She is alert and oriented to person, place, and time.  Psychiatric:        Mood and Affect: Mood normal.        Behavior: Behavior normal.     ED Results / Procedures / Treatments   Labs (all labs ordered are listed, but only abnormal results are displayed) Labs Reviewed  COMPREHENSIVE METABOLIC PANEL WITH GFR - Abnormal; Notable for the following components:      Result Value   CO2 20 (*)    Glucose, Bld 134 (*)    AST 13 (*)    All other components within normal limits  CBC WITH DIFFERENTIAL/PLATELET - Abnormal; Notable for the following components:   Platelets 132 (*)    All other components within normal limits  D-DIMER, QUANTITATIVE - Abnormal; Notable for the following components:   D-Dimer, Quant 1.32 (*)    All other components within normal limits  RESP PANEL BY RT-PCR (RSV, FLU A&B, COVID)  [id number]  BRAIN NATRIURETIC PEPTIDE  MAGNESIUM  TROPONIN I (HIGH SENSITIVITY)  TROPONIN I (HIGH SENSITIVITY)    EKG None  Radiology DG Chest Port 1 View Result Date: 09/15/2023 CLINICAL DATA:  Shortness of breath EXAM: PORTABLE CHEST 1 VIEW COMPARISON:  06/26/2022 FINDINGS: Stable normal sized heart, hyperexpanded lungs and mild chronic accentuation of the interstitial markings. No airspace consolidation or pleural fluid. Diffuse osteopenia. IMPRESSION: 1. No acute abnormality. 2. Stable changes of COPD. Electronically Signed    By: Catherin Closs M.D.   On: 09/15/2023 15:00    Procedures Procedures    Medications Ordered in ED Medications  lactated ringers  bolus 1,000 mL (has no administration in time range)  ipratropium-albuterol  (DUONEB) 0.5-2.5 (3) MG/3ML nebulizer solution 3 mL (3 mLs Nebulization Given 09/15/23 1445)    ED Course/ Medical Decision Making/ A&P                                 Medical Decision Making Amount and/or Complexity of Data Reviewed Labs: ordered. Radiology: ordered.  Risk Prescription drug management.  This patient presents to the ED for concern of shortness of breath, this involves an extensive number of treatment options, and is a complaint that carries with it a high risk of complications and morbidity.  The differential diagnosis includes COPD exacerbation, pneumonia, PE, anxiety   Co morbidities that complicate the patient evaluation  COPD, GERD, HTN, rheumatoid arthritis, squamous cell carcinoma of face   Additional history obtained:  Additional history obtained from patient's daughter External records from outside source obtained and reviewed including EMR   Lab Tests:  I Ordered, and personally interpreted labs.  The pertinent results include: Normal kidney function, normal electrolytes, no leukocytosis.  D-dimer is mildly elevated.   Imaging Studies ordered:  I ordered imaging studies including chest x-ray, CTA chest I independently visualized and interpreted imaging which showed no acute findings on x-ray, CTA pending at time of signout. I agree with the radiologist interpretation   Cardiac Monitoring: / EKG:  The patient was maintained on a cardiac monitor.  I personally viewed and interpreted the cardiac monitored which showed an underlying rhythm of: [place] rhythm   Problem List / ED Course / Critical interventions / Medication management  Patient presenting for episode of anxiety and shortness of breath earlier today.  On arrival in the ED,  she is well-appearing.  Vital signs are normal.  Breathing is unlabored.  SpO2 is normal on room air.  I am auscultation, she does not mildly diminished breath sounds.  DuoNeb was ordered.  Patient reports that she has had ongoing generalized weakness.  She also feels that she is dehydrated due to poor p.o. intake.  She attributes it to loss of appetite.  She denies any nausea, vomiting, or areas of pain.  IV fluids were ordered.  Workup was initiated.  Given patient and daughter's concern of depression and anxiety, they are in favor of initiating low-dose SSRI.  Prozac  was prescribed.  Initial lab work notable for mildly elevated D-dimer.  This did prompt CTA.  Care of patient was signed out to oncoming ED provider. I ordered medication including DuoNeb for COPD; IV fluids for hydration Reevaluation of the patient after these medicines showed that the patient improved I have reviewed the patients home medicines and have made adjustments as needed  Social Determinants of Health:  Has access to outpatient care         Final Clinical Impression(s) / ED Diagnoses Final diagnoses:  SOB (shortness of breath)    Rx / DC Orders ED Discharge Orders          Ordered    FLUoxetine  (PROZAC ) 10 MG tablet  Daily        09/15/23 1509              Iva Mariner, MD 09/15/23 1552

## 2023-09-16 ENCOUNTER — Ambulatory Visit

## 2023-09-17 ENCOUNTER — Ambulatory Visit (INDEPENDENT_AMBULATORY_CARE_PROVIDER_SITE_OTHER): Admitting: Family Medicine

## 2023-09-17 ENCOUNTER — Encounter: Payer: Self-pay | Admitting: Family Medicine

## 2023-09-17 ENCOUNTER — Ambulatory Visit

## 2023-09-17 DIAGNOSIS — I2510 Atherosclerotic heart disease of native coronary artery without angina pectoris: Secondary | ICD-10-CM

## 2023-09-17 DIAGNOSIS — F419 Anxiety disorder, unspecified: Secondary | ICD-10-CM | POA: Diagnosis not present

## 2023-09-17 DIAGNOSIS — Z08 Encounter for follow-up examination after completed treatment for malignant neoplasm: Secondary | ICD-10-CM | POA: Diagnosis not present

## 2023-09-17 DIAGNOSIS — Z85828 Personal history of other malignant neoplasm of skin: Secondary | ICD-10-CM | POA: Diagnosis not present

## 2023-09-17 DIAGNOSIS — R0602 Shortness of breath: Secondary | ICD-10-CM | POA: Diagnosis not present

## 2023-09-17 MED ORDER — CLONAZEPAM 0.5 MG PO TABS: 0.5000 mg | ORAL_TABLET | Freq: Two times a day (BID) | ORAL | 1 refills | Status: DC | PRN

## 2023-09-17 MED ORDER — MIRTAZAPINE 7.5 MG PO TABS: 7.5000 mg | ORAL_TABLET | Freq: Every day | ORAL | 1 refills | Status: DC

## 2023-09-17 NOTE — Patient Instructions (Signed)
 Medications as prescribed.  Follow up in 6 weeks.  Referral placed.

## 2023-09-17 NOTE — Progress Notes (Addendum)
 Subjective:  Patient ID: Dawn Meyer, female    DOB: [birth year]  Age: 68 y.o. MRN: [id number]  CC:   Chief Complaint  Patient presents with   Anxiety    Has cancer    Shortness of Breath    Trouble with deep breaths     HPI:  68 year old female presents for evaluation of the above.  Patient feels very stressed and anxious.  She has underwent several surgeries and subsequent radiation for squamous cell carcinoma of the face.  She reports that her anxiety is uncontrolled.  Was just seen in the ER on 4/22.  Was given clonazepam  and was also started on fluoxetine .  Patient also reports ongoing shortness of breath.  She feels like she cannot get a good deep breath.  CTA was obtained in the ER and revealed the same as well as coronary aortic arch and branch vessel atherosclerotic vascular disease.  She was advised to see cardiology.  She is not having any chest pain.  She is compliant with her home Trelegy.  Continues to smoke.  Additionally, patient reports poor appetite and weight loss.  Patient Active Problem List   Diagnosis Date Noted   Coronary artery disease involving native coronary artery of native heart without angina pectoris 09/18/2023   Anxiety 09/18/2023   SOB (shortness of breath) 09/18/2023   Squamous cell carcinoma of skin of face 07/22/2023   Acquired facial deformity 06/24/2023   Hyperlipidemia 05/14/2023   COPD (chronic obstructive pulmonary disease) (HCC) 07/02/2022   Essential hypertension 10/10/2021   Allergic rhinitis 11/17/2017   Tobacco abuse 11/17/2017   Rheumatoid arthritis involving multiple sites with positive rheumatoid factor (HCC) 08/18/2016   OP (osteoporosis) 08/18/2016    Social Hx   Social History   Socioeconomic History   Marital status: Married    Spouse name: Not on file   Number of children: Not on file   Years of education: Not on file   Highest education level: Not on file  Occupational History   Not on file  Tobacco Use    Smoking status: Every Day    Current packs/day: 0.15    Average packs/day: 0.2 packs/day for 40.0 years (6.0 ttl pk-yrs)    Types: Cigarettes    Passive exposure: Never   Smokeless tobacco: Never  Vaping Use   Vaping status: Never Used  Substance and Sexual Activity   Alcohol use: No   Drug use: No   Sexual activity: Not Currently    Birth control/protection: Post-menopausal  Other Topics Concern   Not on file  Social History Narrative   Not on file   Social Drivers of Health   Financial Resource Strain: Not on file  Food Insecurity: No Food Insecurity (07/22/2023)   Hunger Vital Sign    Worried About Running Out of Food in the Last Year: Never true    Ran Out of Food in the Last Year: Never true  Transportation Needs: No Transportation Needs (07/22/2023)   PRAPARE - Administrator, Civil Service (Medical): No    Lack of Transportation (Non-Medical): No  Physical Activity: Not on file  Stress: Not on file  Social Connections: Not on file    Review of Systems Per HPI  Objective:  BP 113/74   Pulse 88   Temp 99.5 F (37.5 C)   Ht 5' 5.5" (1.664 m)   Wt 140 lb (63.5 kg)   SpO2 94%   BMI 22.94 kg/m  09/17/2023    3:13 PM 09/15/2023    7:00 PM 09/15/2023    6:30 PM  BP/Weight  Systolic BP 113 127 125  Diastolic BP 74 72 72  Wt. (Lbs) 140    BMI 22.94 kg/m2      Physical Exam Vitals and nursing note reviewed.  Constitutional:      Comments: Chronically ill appearing female in no acute distress.  Cardiovascular:     Rate and Rhythm: Normal rate and regular rhythm.  Pulmonary:     Effort: Pulmonary effort is normal.     Breath sounds: Normal breath sounds. No wheezing or rales.  Psychiatric:     Comments: Depressed mood.     Lab Results  Component Value Date   WBC 6.4 09/15/2023   HGB 13.1 09/15/2023   HCT 39.7 09/15/2023   PLT 132 (L) 09/15/2023   GLUCOSE 134 (H) 09/15/2023   CHOL 172 01/08/2021   TRIG 122 01/08/2021   HDL 47  01/08/2021   LDLCALC 103 (H) 01/08/2021   ALT 11 09/15/2023   AST 13 (L) 09/15/2023   NA 137 09/15/2023   K 3.5 09/15/2023   CL 107 09/15/2023   CREATININE 0.77 09/15/2023   BUN 13 09/15/2023   CO2 20 (L) 09/15/2023   TSH 0.702 09/12/2017     Assessment & Plan:  Coronary artery disease involving native coronary artery of native heart without angina pectoris Assessment & Plan: Given shortness of breath and CT findings, referring to cardiology for evaluation.  Orders: -     Ambulatory referral to Cardiology  Anxiety Assessment & Plan: Uncontrolled.  Advised to use clonazepam  sparingly.  Refilled today.  Stopping fluoxetine .  Starting mirtazapine  in hopes that this will help with her mood as well as weight gain/increased appetite.   SOB (shortness of breath) Assessment & Plan: Likely multifactorial.  Continue Trelegy.  Referring to cardiology for evaluation of coronary disease.   Other orders -     Mirtazapine ; Take 1 tablet (7.5 mg total) by mouth at bedtime.  Dispense: 90 tablet; Refill: 1 -     clonazePAM ; Take 1 tablet (0.5 mg total) by mouth 2 (two) times daily as needed for anxiety.  Dispense: 20 tablet; Refill: 1    Follow-up: 6 weeks  Laurieann Friddle Debrah Fan DO [facility]

## 2023-09-18 ENCOUNTER — Ambulatory Visit

## 2023-09-18 DIAGNOSIS — F419 Anxiety disorder, unspecified: Secondary | ICD-10-CM | POA: Insufficient documentation

## 2023-09-18 DIAGNOSIS — I2510 Atherosclerotic heart disease of native coronary artery without angina pectoris: Secondary | ICD-10-CM | POA: Insufficient documentation

## 2023-09-18 DIAGNOSIS — R0602 Shortness of breath: Secondary | ICD-10-CM | POA: Insufficient documentation

## 2023-09-18 NOTE — Assessment & Plan Note (Signed)
 Uncontrolled.  Advised to use clonazepam  sparingly.  Refilled today.  Stopping fluoxetine .  Starting mirtazapine  in hopes that this will help with her mood as well as weight gain/increased appetite.

## 2023-09-18 NOTE — Assessment & Plan Note (Signed)
 Given shortness of breath and CT findings, referring to cardiology for evaluation.

## 2023-09-18 NOTE — Assessment & Plan Note (Signed)
 Likely multifactorial.  Continue Trelegy.  Referring to cardiology for evaluation of coronary disease.

## 2023-09-21 NOTE — Progress Notes (Incomplete)
 Patient identity verified x2  Patient *** is here for a follow-up appointment today- *** for: ***  Treatment Completion Date: *** Pain issues, if any: *** Using a feeding tube?: *** Weight changes, if any: *** Swallowing issues, if any: *** Smoking or chewing tobacco? *** Using fluoride toothpaste daily? *** Last ENT visit was on: *** Other notable issues, if any: ***

## 2023-09-22 ENCOUNTER — Ambulatory Visit
Admission: RE | Admit: 2023-09-22 | Discharge: 2023-09-22 | Disposition: A | Discharge status: Home / Self Care | Source: Ambulatory Visit | Attending: Radiology | Admitting: Radiology

## 2023-09-22 VITALS — BP 144/80 | HR 81 | Temp 97.5°F | Resp 18 | Ht 65.5 in | Wt 139.2 lb

## 2023-09-22 DIAGNOSIS — C44319 Basal cell carcinoma of skin of other parts of face: Secondary | ICD-10-CM | POA: Insufficient documentation

## 2023-09-22 DIAGNOSIS — C44320 Squamous cell carcinoma of skin of unspecified parts of face: Secondary | ICD-10-CM

## 2023-09-22 DIAGNOSIS — I700 Atherosclerosis of aorta: Secondary | ICD-10-CM | POA: Insufficient documentation

## 2023-09-22 DIAGNOSIS — M8580 Other specified disorders of bone density and structure, unspecified site: Secondary | ICD-10-CM | POA: Diagnosis not present

## 2023-09-22 DIAGNOSIS — F17210 Nicotine dependence, cigarettes, uncomplicated: Secondary | ICD-10-CM | POA: Diagnosis not present

## 2023-09-22 DIAGNOSIS — J432 Centrilobular emphysema: Secondary | ICD-10-CM | POA: Diagnosis not present

## 2023-09-22 DIAGNOSIS — Z79899 Other long term (current) drug therapy: Secondary | ICD-10-CM | POA: Insufficient documentation

## 2023-09-22 DIAGNOSIS — M4854XA Collapsed vertebra, not elsewhere classified, thoracic region, initial encounter for fracture: Secondary | ICD-10-CM | POA: Diagnosis not present

## 2023-09-22 NOTE — Progress Notes (Signed)
 Radiation Oncology         [phone] ________________________________  Name: Dawn Meyer MRN: [id number]  Date: 09/22/2023  DOB: [birth year]  Follow-Up Visit Note  CC: Cook, Jayce G, DO  Cook, Jayce G, DO  Diagnosis and Prior Radiotherapy:       ICD-10-CM   1. Squamous cell carcinoma of skin of face  C44.320 CT Soft Tissue Neck W Contrast      ==========DELIVERED PLANS==========  First Treatment Date: 2023-08-24 Last Treatment Date: 2023-09-07   Plan Name: HN_R_PreAuri Site: Neck Right Technique: IMRT Mode: Photon Dose Per Fraction: 2.5 Gy Prescribed Dose (Delivered / Prescribed): 17.5 Gy / 17.5 Gy Prescribed Fxs (Delivered / Prescribed): 7 / 7   Plan Name: HN_R_PreAur:1 Site: Neck Right Technique: IMRT Mode: Photon Dose Per Fraction: 6 Gy Prescribed Dose (Delivered / Prescribed): 18 Gy / 18 Gy Prescribed Fxs (Delivered / Prescribed): 3 / 3   Cancer Staging  Squamous cell carcinoma of skin of face Staging form: Cutaneous Carcinoma of the Head and Neck, AJCC 8th Edition - Clinical stage from 07/22/2023: Stage II (cT2, cN0, cM0) - Signed by Colie Dawes, MD on 07/22/2023 Stage prefix: Initial diagnosis Extraosseous extension: Absent  Stage II (cT2, N0, M0) squamous cell carcinoma of the skin of the face; s/p MOH's excision and adjuvant radiation completed on 09/07/2023  CHIEF COMPLAINT:  Here for follow-up and surveillance of cutaneous squamous cell carcinoma of the skin of the face  Narrative:  The patient returns today for routine follow-up. She completed her treatment on 09/07/2023.   Pain issues, if any: Denies Using a feeding tube?: N/A Weight changes, if any:  Swallowing issues, if any: She reports doing much better. Smoking or chewing tobacco? Smoking Using fluoride toothpaste daily? Yes Last ENT visit was on: She reports seeing with Dr. Rush Coupe on 09/11/2023. She was doing well overall at that time and Dr. Ralston Burkes recommended 3 month f/up.   Other notable issues, if any: Patient recently presented to the ER on 09/15/2023 for anxiety. She is was placed on Prozac  and is following up with her PCP for management of this.              ALLERGIES:  is allergic to doxycycline  and aquaphor [lanolin-petrolatum ].  Meds: Current Outpatient Medications  Medication Sig Dispense Refill   Acetaminophen  (TYLENOL  ARTHRITIS PAIN PO) Take by mouth as needed.     albuterol  (VENTOLIN  HFA) 108 (90 Base) MCG/ACT inhaler INHALE TWO PUFFS INTO THE LUNGS EVERY SIX HOURS AS NEEDED FOR WHEEZING OR SHORTNESS OF BREATH 18 g 3   amLODipine  (NORVASC ) 10 MG tablet Take 1 tablet (10 mg total) by mouth daily. 90 tablet 3   clonazePAM  (KLONOPIN ) 0.5 MG tablet Take 1 tablet (0.5 mg total) by mouth 2 (two) times daily as needed for anxiety. 20 tablet 1   Fluticasone -Umeclidin-Vilant (TRELEGY ELLIPTA ) 100-62.5-25 MCG/ACT AEPB Inhale 1 puff into the lungs daily. 1 each 11   lidocaine  (XYLOCAINE ) 2 % solution Patient: Mix 1part 2% viscous lidocaine , 1part H20. Swish & swallow 10mL of diluted mixture, 30min before meals and at bedtime, up to QID 200 mL 2   mirtazapine  (REMERON ) 7.5 MG tablet Take 1 tablet (7.5 mg total) by mouth at bedtime. 90 tablet 1   No current facility-administered medications for this encounter.    Physical Findings: The patient is in no acute distress. Patient is alert and oriented. Wt Readings from Last 3 Encounters:  09/22/23 139 lb 3.2 oz (63.1 kg)  09/17/23  140 lb (63.5 kg)  09/15/23 139 lb (63 kg)    height is 5' 5.5" (1.664 m) and weight is 139 lb 3.2 oz (63.1 kg). Her temperature is 97.5 F (36.4 C) (abnormal). Her blood pressure is 144/80 (abnormal) and her pulse is 81. Her respiration is 18 and oxygen saturation is 100%. .  General: Alert and oriented, in no acute distress HEENT: Head is normocephalic. Extraocular movements are intact. Oropharynx is notable for no concerning lesions. Dentures in place.  Neck: Neck is notable for  well-healed surgical incision. Minimal lymphedema at the submental level and anterior neck. No palpable cervical, pre-auricular, or supraclavicular adenopathy.  Skin: Skin in treatment fields shows dry desquamation, consistent with satisfactory healing Extremities: No cyanosis or edema. Lymphatics: see Neck Exam Psychiatric: Judgment and insight are intact. Affect is appropriate.   Lab Findings: Lab Results  Component Value Date   WBC 6.4 09/15/2023   HGB 13.1 09/15/2023   HCT 39.7 09/15/2023   MCV 94.7 09/15/2023   PLT 132 (L) 09/15/2023    Lab Results  Component Value Date   TSH 0.702 09/12/2017    Radiographic Findings: CT Angio Chest PE W and/or Wo Contrast Result Date: 09/15/2023 CLINICAL DATA:  Shortness of breath and weakness EXAM: CT ANGIOGRAPHY CHEST WITH CONTRAST TECHNIQUE: Multidetector CT imaging of the chest was performed using the standard protocol during bolus administration of intravenous contrast. Multiplanar CT image reconstructions and MIPs were obtained to evaluate the vascular anatomy. RADIATION DOSE REDUCTION: This exam was performed according to the departmental dose-optimization program which includes automated exposure control, adjustment of the mA and/or kV according to patient size and/or use of iterative reconstruction technique. CONTRAST:  75mL OMNIPAQUE  IOHEXOL  350 MG/ML SOLN COMPARISON:  Chest radiograph 09/15/2023 FINDINGS: Cardiovascular: No filling defect is identified in the pulmonary arterial tree to suggest pulmonary embolus. Coronary, aortic arch, and branch vessel atherosclerotic vascular disease. Mediastinum/Nodes: Unremarkable Lungs/Pleura: Centrilobular emphysema. Biapical pleuroparenchymal scarring. Upper Abdomen: Unremarkable Musculoskeletal: Chronic T7 superior endplate compression fracture, also present on 02/07/2009. Review of the MIP images confirms the above findings. IMPRESSION: 1. No filling defect is identified in the pulmonary arterial tree  to suggest pulmonary embolus. 2.  Emphysema (ICD10-J43.9). 3. Chronic T7 superior endplate compression fracture. 4. Coronary, aortic arch, and branch vessel atherosclerotic vascular disease. Aortic Atherosclerosis (ICD10-I70.0). Electronically Signed   By: Freida Jes M.D.   On: 09/15/2023 16:38   DG Chest Port 1 View Result Date: 09/15/2023 CLINICAL DATA:  Shortness of breath EXAM: PORTABLE CHEST 1 VIEW COMPARISON:  06/26/2022 FINDINGS: Stable normal sized heart, hyperexpanded lungs and mild chronic accentuation of the interstitial markings. No airspace consolidation or pleural fluid. Diffuse osteopenia. IMPRESSION: 1. No acute abnormality. 2. Stable changes of COPD. Electronically Signed   By: Catherin Closs M.D.   On: 09/15/2023 15:00    Impression/Plan:  Stage II (cT2, N0, M0) squamous cell carcinoma of the skin of the face; s/p MOH's excision and adjuvant radiation completed on 09/07/2023  1) Head and Neck Cancer Status: Patient is doing very well and continues to heal from the effects of her radiation treatment.   2) Nutritional Status: decreased appetite due to taste changes - counseled patient on what to expect post-radiation.  Wt Readings from Last 3 Encounters:  09/22/23 139 lb 3.2 oz (63.1 kg)  09/17/23 140 lb (63.5 kg)  09/15/23 139 lb (63 kg)   3) Risk Factors: The patient has been educated about risk factors including alcohol and tobacco abuse; they understand that  avoidance of alcohol and tobacco is important to prevent recurrences as well as other cancers. She continues to smoke and is currently working with her PCP on smoking cessation.   4) Swallowing: Functional, able to swallow wide variety of foods.   5) Dental: Encouraged to continue regular followup with dentistry, and dental hygiene including fluoride rinses.   6) Patient will see ENT in 2 months. CT of the neck in 3 months with rad-onc office visit to follow. The patient was encouraged to call with any issues or  questions before then.  On date of service, in total, I spent 20 minutes on this encounter. Patient was seen in person. _____________________________________    Julio Ohm, PA-C

## 2023-09-24 ENCOUNTER — Ambulatory Visit: Payer: Medicare PPO | Admitting: Rheumatology

## 2023-09-24 DIAGNOSIS — E559 Vitamin D deficiency, unspecified: Secondary | ICD-10-CM

## 2023-09-24 DIAGNOSIS — I10 Essential (primary) hypertension: Secondary | ICD-10-CM

## 2023-09-24 DIAGNOSIS — M810 Age-related osteoporosis without current pathological fracture: Secondary | ICD-10-CM

## 2023-09-24 DIAGNOSIS — Z85828 Personal history of other malignant neoplasm of skin: Secondary | ICD-10-CM

## 2023-09-24 DIAGNOSIS — M19042 Primary osteoarthritis, left hand: Secondary | ICD-10-CM

## 2023-09-24 DIAGNOSIS — M19071 Primary osteoarthritis, right ankle and foot: Secondary | ICD-10-CM

## 2023-09-24 DIAGNOSIS — Z8709 Personal history of other diseases of the respiratory system: Secondary | ICD-10-CM

## 2023-09-24 DIAGNOSIS — M0579 Rheumatoid arthritis with rheumatoid factor of multiple sites without organ or systems involvement: Secondary | ICD-10-CM

## 2023-09-24 DIAGNOSIS — Z79899 Other long term (current) drug therapy: Secondary | ICD-10-CM

## 2023-09-24 DIAGNOSIS — F17200 Nicotine dependence, unspecified, uncomplicated: Secondary | ICD-10-CM

## 2023-10-06 DIAGNOSIS — Q181 Preauricular sinus and cyst: Secondary | ICD-10-CM | POA: Diagnosis not present

## 2023-10-06 DIAGNOSIS — H60391 Other infective otitis externa, right ear: Secondary | ICD-10-CM | POA: Diagnosis not present

## 2023-10-06 DIAGNOSIS — M952 Other acquired deformity of head: Secondary | ICD-10-CM | POA: Diagnosis not present

## 2023-10-06 DIAGNOSIS — C4499 Other specified malignant neoplasm of skin, unspecified: Secondary | ICD-10-CM | POA: Diagnosis not present

## 2023-10-08 DIAGNOSIS — D0439 Carcinoma in situ of skin of other parts of face: Secondary | ICD-10-CM | POA: Diagnosis not present

## 2023-10-08 DIAGNOSIS — L570 Actinic keratosis: Secondary | ICD-10-CM | POA: Diagnosis not present

## 2023-10-08 DIAGNOSIS — X32XXXD Exposure to sunlight, subsequent encounter: Secondary | ICD-10-CM | POA: Diagnosis not present

## 2023-10-12 ENCOUNTER — Ambulatory Visit: Payer: Self-pay

## 2023-10-12 NOTE — Telephone Encounter (Signed)
 Pt called back stating she has not yet heard from her PCP. See previous Nurse Triage Encounter notes. Pt would appreciate follow-up from the office.

## 2023-10-12 NOTE — Telephone Encounter (Signed)
  Chief Complaint: nausea Symptoms: nausea, possibly r/t medication Frequency: x 3 weeks Pertinent Negatives: Patient denies fever, vomiting, dehydration Disposition: [] ED /[] Urgent Care (no appt availability in office) / [] Appointment(In office/virtual)/ []  [facility]/ [] Home Care/ [x] Refused Recommended Disposition /[] [facility]/ [x]  Follow-up with PCP Additional Notes: Pt c/o nausea, likely r/t new medication (mirtazapine  (REMERON ) 7.5 MG). Pt reports nausea correlated with starting medication to increase appetite/weight gain. Pt reports recently receiving radiation tx and finished 4/15. Pt endorses maintaining weight, but is having to force herself to eat along with the nausea that the medication may be causing. Pt denies dehydration or vomiting. Pt requesting guidance from PCP. [facility] will forward encounter for Dr. Debrah Fan 's office to review and advise if she can switch medication. Patient verbalized understanding and is expecting call back from office for next steps.     Copied from CRM [id number]. Topic: Clinical - Medical Advice >> Oct 12, 2023  9:25 AM Turkey B wrote: Reason for CRM: pt called in says she feels nauseous in the morning, where it affects her eating breakfast, and feeling like its the med, mirtazapine  (REMERON ) 7.5 MG tablet she is taking. Pt wanting to know can she try something else. She didnt want an appt Reason for Disposition  Nausea lasts > 1 week  Answer Assessment - Initial Assessment Questions 1. NAME of MEDICINE: "What medicine(s) are you calling about?"     mirtazapine  (REMERON ) 7.5 MG 2. QUESTION: "What is your question?" (e.g., double dose of medicine, side effect)     Questions about SE of mirtazapine  (REMERON ) 7.5 MG 3. PRESCRIBER: "Who prescribed the medicine?" Reason: if prescribed by specialist, call should be referred to that group.     Dr. Debrah Fan 4. SYMPTOMS: "Do you have any symptoms?" If Yes, ask: "What symptoms are you having?"   "How bad are the symptoms (e.g., mild, moderate, severe)     "Feeling quesy" Reports "force feeding something for breakfast"  Answer Assessment - Initial Assessment Questions 1. NAUSEA SEVERITY: "How bad is the nausea?" (e.g., mild, moderate, severe; dehydration, weight loss)   - MILD: loss of appetite without change in eating habits   - MODERATE: decreased oral intake without significant weight loss, dehydration, or malnutrition   - SEVERE: inadequate caloric or fluid intake, significant weight loss, symptoms of dehydration     Reports maintaining weight 2. ONSET: "When did the nausea begin?"     X 3 week 3. VOMITING: "Any vomiting?" If Yes, ask: "How many times today?"     denies 4. RECURRENT SYMPTOM: "Have you had nausea before?" If Yes, ask: "When was the last time?" "What happened that time?"     denies 5. CAUSE: "What do you think is causing the nausea?"     medication  Protocols used: Medication Question Call-A-AH, Nausea-A-AH

## 2023-10-13 NOTE — Telephone Encounter (Signed)
 Patient has been made aware and scheduled.

## 2023-10-14 ENCOUNTER — Encounter: Payer: Self-pay | Admitting: Family Medicine

## 2023-10-14 ENCOUNTER — Ambulatory Visit: Admitting: Family Medicine

## 2023-10-14 VITALS — BP 132/76 | HR 87 | Temp 98.2°F | Ht 65.5 in | Wt 135.0 lb

## 2023-10-14 DIAGNOSIS — F419 Anxiety disorder, unspecified: Secondary | ICD-10-CM | POA: Diagnosis not present

## 2023-10-14 MED ORDER — SERTRALINE HCL 50 MG PO TABS
50.0000 mg | ORAL_TABLET | Freq: Every day | ORAL | 1 refills | Status: DC
Start: 2023-10-14 — End: 2023-10-22

## 2023-10-14 MED ORDER — ONDANSETRON 4 MG PO TBDP: 4.0000 mg | ORAL_TABLET | Freq: Three times a day (TID) | ORAL | 0 refills | Status: DC | PRN

## 2023-10-14 NOTE — Progress Notes (Signed)
 Subjective:  Patient ID: Dawn Meyer, female    DOB: 07-05-1955  Age: 68 y.o. MRN: [id number]  CC:   Chief Complaint  Patient presents with   Medication Management    Remeron  -nausea in morning     HPI:  68 year old female presents for evaluation of the above  Patient reports that she is experiencing nausea particularly in the morning.  She believes that this is from recent addition of mirtazapine .  Patient states that this is impacting her appetite and she wishes to discuss other therapeutic options regarding her anxiety.  Patient Active Problem List   Diagnosis Date Noted   Coronary artery disease involving native coronary artery of native heart without angina pectoris 09/18/2023   Anxiety 09/18/2023   SOB (shortness of breath) 09/18/2023   Squamous cell carcinoma of skin of face 07/22/2023   Acquired facial deformity 06/24/2023   Hyperlipidemia 05/14/2023   COPD (chronic obstructive pulmonary disease) (HCC) 07/02/2022   Essential hypertension 10/10/2021   Allergic rhinitis 11/17/2017   Tobacco abuse 11/17/2017   Rheumatoid arthritis involving multiple sites with positive rheumatoid factor (HCC) 08/18/2016   OP (osteoporosis) 08/18/2016    Social Hx   Social History   Socioeconomic History   Marital status: Married    Spouse name: Not on file   Number of children: Not on file   Years of education: Not on file   Highest education level: Not on file  Occupational History   Not on file  Tobacco Use   Smoking status: Every Day    Current packs/day: 0.15    Average packs/day: 0.2 packs/day for 40.0 years (6.0 ttl pk-yrs)    Types: Cigarettes    Passive exposure: Never   Smokeless tobacco: Never  Vaping Use   Vaping status: Never Used  Substance and Sexual Activity   Alcohol use: No   Drug use: No   Sexual activity: Not Currently    Birth control/protection: Post-menopausal  Other Topics Concern   Not on file  Social History Narrative   Not on file    Social Drivers of Health   Financial Resource Strain: Not on file  Food Insecurity: No Food Insecurity (07/22/2023)   Hunger Vital Sign    Worried About Running Out of Food in the Last Year: Never true    Ran Out of Food in the Last Year: Never true  Transportation Needs: No Transportation Needs (07/22/2023)   PRAPARE - Administrator, Civil Service (Medical): No    Lack of Transportation (Non-Medical): No  Physical Activity: Not on file  Stress: Not on file  Social Connections: Not on file    Review of Systems Per HPI  Objective:  BP 132/76   Pulse 87   Temp 98.2 F (36.8 C)   Ht 5' 5.5" (1.664 m)   Wt 135 lb (61.2 kg)   SpO2 99%   BMI 22.12 kg/m      10/14/2023   11:14 AM 09/22/2023    9:40 AM 09/17/2023    3:13 PM  BP/Weight  Systolic BP 132 144 113  Diastolic BP 76 80 74  Wt. (Lbs) 135 139.2 140  BMI 22.12 kg/m2 22.81 kg/m2 22.94 kg/m2    Physical Exam Vitals and nursing note reviewed.  Constitutional:      General: She is not in acute distress. HENT:     Head: Normocephalic and atraumatic.  Pulmonary:     Effort: Pulmonary effort is normal. No respiratory distress.  Neurological:  Mental Status: She is alert.     Lab Results  Component Value Date   WBC 6.4 09/15/2023   HGB 13.1 09/15/2023   HCT 39.7 09/15/2023   PLT 132 (L) 09/15/2023   GLUCOSE 134 (H) 09/15/2023   CHOL 172 01/08/2021   TRIG 122 01/08/2021   HDL 47 01/08/2021   LDLCALC 103 (H) 01/08/2021   ALT 11 09/15/2023   AST 13 (L) 09/15/2023   NA 137 09/15/2023   K 3.5 09/15/2023   CL 107 09/15/2023   CREATININE 0.77 09/15/2023   BUN 13 09/15/2023   CO2 20 (L) 09/15/2023   TSH 0.702 09/12/2017     Assessment & Plan:  Anxiety Assessment & Plan: Uncontrolled.  Experiencing side effects from treatment.  Stopping mirtazapine .  Starting Zoloft.  Zofran  as needed for nausea.   Other orders -     Sertraline HCl; Take 1 tablet (50 mg total) by mouth daily.  Dispense:  90 tablet; Refill: 1 -     Ondansetron ; Take 1 tablet (4 mg total) by mouth every 8 (eight) hours as needed for nausea or vomiting.  Dispense: 20 tablet; Refill: 0    Follow-up: Has follow-up in June  Kathleen Papa DO [facility]

## 2023-10-14 NOTE — Assessment & Plan Note (Signed)
 Uncontrolled.  Experiencing side effects from treatment.  Stopping mirtazapine .  Starting Zoloft.  Zofran  as needed for nausea.

## 2023-10-14 NOTE — Patient Instructions (Signed)
 Stop Mirtazapine .  Start Sertraline.  Zofran  as needed for nausea.  Take care  Dr. Debrah Fan

## 2023-10-16 ENCOUNTER — Other Ambulatory Visit: Payer: Self-pay

## 2023-10-16 ENCOUNTER — Encounter (HOSPITAL_COMMUNITY): Payer: Self-pay | Admitting: Emergency Medicine

## 2023-10-16 ENCOUNTER — Emergency Department (HOSPITAL_COMMUNITY)
Admission: EM | Admit: 2023-10-16 | Discharge: 2023-10-16 | Disposition: A | Discharge status: Home / Self Care | Attending: Emergency Medicine | Admitting: Emergency Medicine

## 2023-10-16 DIAGNOSIS — J4489 Other specified chronic obstructive pulmonary disease: Secondary | ICD-10-CM | POA: Diagnosis not present

## 2023-10-16 DIAGNOSIS — I10 Essential (primary) hypertension: Secondary | ICD-10-CM | POA: Insufficient documentation

## 2023-10-16 DIAGNOSIS — Z79899 Other long term (current) drug therapy: Secondary | ICD-10-CM | POA: Insufficient documentation

## 2023-10-16 DIAGNOSIS — Z85841 Personal history of malignant neoplasm of brain: Secondary | ICD-10-CM | POA: Diagnosis not present

## 2023-10-16 DIAGNOSIS — Z87891 Personal history of nicotine dependence: Secondary | ICD-10-CM | POA: Insufficient documentation

## 2023-10-16 DIAGNOSIS — R110 Nausea: Secondary | ICD-10-CM | POA: Diagnosis not present

## 2023-10-16 DIAGNOSIS — Z8589 Personal history of malignant neoplasm of other organs and systems: Secondary | ICD-10-CM | POA: Insufficient documentation

## 2023-10-16 LAB — CBC WITH DIFFERENTIAL/PLATELET
Abs Immature Granulocytes: 0.03 10*3/uL (ref 0.00–0.07)
Basophils Absolute: 0 10*3/uL (ref 0.0–0.1)
Basophils Relative: 0 %
Eosinophils Absolute: 0.4 10*3/uL (ref 0.0–0.5)
Eosinophils Relative: 4 %
HCT: 39.7 % (ref 36.0–46.0)
Hemoglobin: 13.5 g/dL (ref 12.0–15.0)
Immature Granulocytes: 0 %
Lymphocytes Relative: 12 %
Lymphs Abs: 1.2 10*3/uL (ref 0.7–4.0)
MCH: 32.8 pg (ref 26.0–34.0)
MCHC: 34 g/dL (ref 30.0–36.0)
MCV: 96.6 fL (ref 80.0–100.0)
Monocytes Absolute: 0.7 10*3/uL (ref 0.1–1.0)
Monocytes Relative: 7 %
Neutro Abs: 7.8 10*3/uL — ABNORMAL HIGH (ref 1.7–7.7)
Neutrophils Relative %: 77 %
Platelets: 168 10*3/uL (ref 150–400)
RBC: 4.11 MIL/uL (ref 3.87–5.11)
RDW: 13.2 % (ref 11.5–15.5)
WBC: 10.2 10*3/uL (ref 4.0–10.5)
nRBC: 0 % (ref 0.0–0.2)

## 2023-10-16 LAB — COMPREHENSIVE METABOLIC PANEL WITH GFR
ALT: 10 U/L (ref 0–44)
AST: 15 U/L (ref 15–41)
Albumin: 3.7 g/dL (ref 3.5–5.0)
Alkaline Phosphatase: 109 U/L (ref 38–126)
Anion gap: 6 (ref 5–15)
BUN: 10 mg/dL (ref 8–23)
CO2: 21 mmol/L — ABNORMAL LOW (ref 22–32)
Calcium: 8.8 mg/dL — ABNORMAL LOW (ref 8.9–10.3)
Chloride: 110 mmol/L (ref 98–111)
Creatinine, Ser: 0.75 mg/dL (ref 0.44–1.00)
GFR, Estimated: >60 mL/min (ref >60)
Glucose, Bld: 103 mg/dL — ABNORMAL HIGH (ref 70–99)
Potassium: 3.5 mmol/L (ref 3.5–5.1)
Sodium: 137 mmol/L (ref 135–145)
Total Bilirubin: 0.7 mg/dL (ref 0.0–1.2)
Total Protein: 7 g/dL (ref 6.5–8.1)

## 2023-10-16 MED ORDER — SODIUM CHLORIDE 0.9 % IV SOLN
12.5000 mg | Freq: Four times a day (QID) | INTRAVENOUS | Status: DC | PRN
  Administered 2023-10-16: 12.5 mg via INTRAVENOUS
  Filled 2023-10-16: qty 0.5

## 2023-10-16 MED ORDER — OMEPRAZOLE 20 MG PO CPDR: 20.0000 mg | DELAYED_RELEASE_CAPSULE | Freq: Every day | ORAL | 0 refills | Status: DC

## 2023-10-16 MED ORDER — PROMETHAZINE HCL 25 MG PO TABS: 25.0000 mg | ORAL_TABLET | Freq: Four times a day (QID) | ORAL | 0 refills | Status: DC | PRN

## 2023-10-16 MED ORDER — SODIUM CHLORIDE 0.9 % IV BOLUS
1000.0000 mL | Freq: Once | INTRAVENOUS | Status: AC
  Administered 2023-10-16: 1000 mL via INTRAVENOUS

## 2023-10-16 NOTE — ED Provider Notes (Signed)
 [place] EMERGENCY DEPARTMENT AT [facility] Provider Note   CSN: [id number] Arrival date & time: 10/16/23  3086     History  Chief Complaint  Patient presents with   Nausea    Dawn Meyer is a 68 y.o. female.  HPI     This is a 68 year old female who presents with nausea.  Patient reports that she recently finished radiation treatment for head neck cancer.  She was placed on Bactrim by her ENT for staph infection.  She states 3 to 4 days into taking Bactrim she developed nausea and intolerance to the medication.  She was switched to Keflex .  She has had some persistent nausea.  No vomiting.  No chest pain or abdominal pain.  She states that her nausea is not improved with Zofran .  She also states that she had a similar nauseous feeling after starting an anxiety medication.  Her primary care doctor switched her meds but she has had ongoing nausea.  She denies any other symptoms.  She states she has had decreased p.o. intake secondary to nausea.  Home Medications Prior to Admission medications   Medication Sig Start Date End Date Taking? Authorizing Provider  omeprazole (PRILOSEC) 20 MG capsule Take 1 capsule (20 mg total) by mouth daily. 10/16/23  Yes Shamela Haydon, Vonzella Guernsey, MD  promethazine (PHENERGAN) 25 MG tablet Take 1 tablet (25 mg total) by mouth every 6 (six) hours as needed for nausea or vomiting. 10/16/23  Yes Valoria Tamburri, Vonzella Guernsey, MD  Acetaminophen  (TYLENOL  ARTHRITIS PAIN PO) Take by mouth as needed.    [provider]  albuterol  (VENTOLIN  HFA) 108 (90 Base) MCG/ACT inhaler INHALE TWO PUFFS INTO THE LUNGS EVERY SIX HOURS AS NEEDED FOR WHEEZING OR SHORTNESS OF BREATH 05/13/23   Cook, Jayce G, DO  amLODipine  (NORVASC ) 10 MG tablet Take 1 tablet (10 mg total) by mouth daily. 11/11/22   Cook, Jayce G, DO  clonazePAM  (KLONOPIN ) 0.5 MG tablet Take 1 tablet (0.5 mg total) by mouth 2 (two) times daily as needed for anxiety. 09/17/23   Cook, Jayce G, DO   Fluticasone -Umeclidin-Vilant (TRELEGY ELLIPTA ) 100-62.5-25 MCG/ACT AEPB Inhale 1 puff into the lungs daily. 05/13/23   Cook, Jayce G, DO  ondansetron  (ZOFRAN -ODT) 4 MG disintegrating tablet Take 1 tablet (4 mg total) by mouth every 8 (eight) hours as needed for nausea or vomiting. 10/14/23   Cook, Jayce G, DO  sertraline (ZOLOFT) 50 MG tablet Take 1 tablet (50 mg total) by mouth daily. 10/14/23   Cook, Jayce G, DO      Allergies    Doxycycline  and Aquaphor [lanolin-petrolatum ]    Review of Systems   Review of Systems  Respiratory:  Negative for shortness of breath.   Cardiovascular:  Negative for chest pain.  Gastrointestinal:  Positive for nausea. Negative for abdominal pain, constipation, diarrhea and vomiting.  All other systems reviewed and are negative.   Physical Exam Updated Vital Signs BP 132/65   Pulse 66   Temp 98 F (36.7 C) (Oral)   Resp 15   Ht 1.664 m (5' 5.5")   Wt 61.7 kg   SpO2 97%   BMI 22.29 kg/m  Physical Exam Vitals and nursing note reviewed.  Constitutional:      Appearance: She is well-developed. She is not ill-appearing.  HENT:     Head: Normocephalic.     Comments: Facial asymmetry noted with droop of the right side Eyes:     Comments: Lid lag on the right  Cardiovascular:     Rate and Rhythm: Normal rate and regular rhythm.     Heart sounds: Normal heart sounds.  Pulmonary:     Effort: Pulmonary effort is normal. No respiratory distress.  Abdominal:     General: Bowel sounds are normal.     Palpations: Abdomen is soft.     Tenderness: There is no abdominal tenderness. There is no guarding or rebound.  Musculoskeletal:     Cervical back: Neck supple.  Skin:    General: Skin is warm and dry.  Neurological:     Mental Status: She is alert and oriented to person, place, and time.  Psychiatric:        Mood and Affect: Mood normal.     ED Results / Procedures / Treatments   Labs (all labs ordered are listed, but only abnormal results are  displayed) Labs Reviewed  CBC WITH DIFFERENTIAL/PLATELET - Abnormal; Notable for the following components:      Result Value   Neutro Abs 7.8 (*)    All other components within normal limits  COMPREHENSIVE METABOLIC PANEL WITH GFR - Abnormal; Notable for the following components:   CO2 21 (*)    Glucose, Bld 103 (*)    Calcium 8.8 (*)    All other components within normal limits    EKG EKG Interpretation Date/Time:  Friday Oct 16 2023 04:05:11 EDT Ventricular Rate:  71 PR Interval:  163 QRS Duration:  98 QT Interval:  430 QTC Calculation: 468 R Axis:   -64  Text Interpretation: Sinus rhythm Left anterior fascicular block Abnormal R-wave progression, early transition Consider anterior infarct Confirmed by Donita Furrow ([id number]) on 10/16/2023 5:55:26 AM  Radiology No results found.  Procedures Procedures    Medications Ordered in ED Medications  promethazine (PHENERGAN) 12.5 mg in sodium chloride  0.9 % 50 mL IVPB (0 mg Intravenous Stopped 10/16/23 0446)  sodium chloride  0.9 % bolus 1,000 mL (1,000 mLs Intravenous New Bag/Given 10/16/23 0426)    ED Course/ Medical Decision Making/ A&P                                 Medical Decision Making Amount and/or Complexity of Data Reviewed Labs: ordered.  Risk Prescription drug management.   This patient presents to the ED for concern of nausea, this involves an extensive number of treatment options, and is a complaint that carries with it a high risk of complications and morbidity.  I considered the following differential and admission for this acute, potentially life threatening condition.  The differential diagnosis includes medication related nausea, atypical ACS, viral illness, gastritis  MDM:    This is a 68 year old female who presents with nausea.  Initially started with Bactrim.  Also intolerant of an anxiety medication.  Symptoms have persisted.  She has not had any vomiting or other change in bowels.  Denies any  chest pain or abdominal pain.  She is nontoxic and vital signs are reassuring.  Patient was given fluids and IV Phenergan with some improvement.  Labs obtained and largely reassuring.  EKG shows no evidence of acute ischemia.  Patient seems quite sensitive to certain medications.  Will switch to Phenergan and add omeprazole to see if this helps.  (Labs, imaging, consults)  Labs: I Ordered, and personally interpreted labs.  The pertinent results include: CBC, BMP  Imaging Studies ordered: I ordered imaging studies including N/A I independently visualized and interpreted imaging. I  agree with the radiologist interpretation  Additional history obtained from chart review.  External records from outside source obtained and reviewed including prior evaluations  Cardiac Monitoring: The patient was maintained on a cardiac monitor.  If on the cardiac monitor, I personally viewed and interpreted the cardiac monitored which showed an underlying rhythm of: NS  Reevaluation: After the interventions noted above, I reevaluated the patient and found that they have :improved  Social Determinants of Health:  lives independently  Disposition: discharge  Co morbidities that complicate the patient evaluation  Past Medical History:  Diagnosis Date   Asthma    COPD (chronic obstructive pulmonary disease) (HCC)    GERD (gastroesophageal reflux disease)    Hypertension    Prolapsed uterus    per patient, dx by OB-GYN   RA (rheumatoid arthritis) (HCC)    Smoker    1/4/ppd     Medicines Meds ordered this encounter  Medications   sodium chloride  0.9 % bolus 1,000 mL   promethazine (PHENERGAN) 12.5 mg in sodium chloride  0.9 % 50 mL IVPB   promethazine (PHENERGAN) 25 MG tablet    Sig: Take 1 tablet (25 mg total) by mouth every 6 (six) hours as needed for nausea or vomiting.    Dispense:  30 tablet    Refill:  0   omeprazole (PRILOSEC) 20 MG capsule    Sig: Take 1 capsule (20 mg total) by mouth  daily.    Dispense:  30 capsule    Refill:  0    I have reviewed the patients home medicines and have made adjustments as needed  Problem List / ED Course: Problem List Items Addressed This Visit   None Visit Diagnoses       Nausea    -  Primary                   Final Clinical Impression(s) / ED Diagnoses Final diagnoses:  Nausea    Rx / DC Orders ED Discharge Orders          Ordered    promethazine (PHENERGAN) 25 MG tablet  Every 6 hours PRN        10/16/23 0612    omeprazole (PRILOSEC) 20 MG capsule  Daily        10/16/23 0612              Rory Collard, MD 10/16/23 [phone]

## 2023-10-16 NOTE — Discharge Instructions (Signed)
 You were seen today for nausea.  Your workup is reassuring.  Switch to Phenergan to see if this helps.  Follow-up with your primary doctor.

## 2023-10-16 NOTE — ED Triage Notes (Signed)
 Pt with c/o nausea and prescribed Zofran  "not working".

## 2023-10-18 ENCOUNTER — Encounter (HOSPITAL_COMMUNITY): Payer: Self-pay | Admitting: *Deleted

## 2023-10-18 ENCOUNTER — Emergency Department (HOSPITAL_COMMUNITY)
Admission: EM | Admit: 2023-10-18 | Discharge: 2023-10-18 | Disposition: A | Discharge status: Home / Self Care | Attending: Emergency Medicine | Admitting: Emergency Medicine

## 2023-10-18 ENCOUNTER — Emergency Department (HOSPITAL_COMMUNITY)

## 2023-10-18 ENCOUNTER — Other Ambulatory Visit: Payer: Self-pay

## 2023-10-18 DIAGNOSIS — R1013 Epigastric pain: Secondary | ICD-10-CM | POA: Diagnosis not present

## 2023-10-18 DIAGNOSIS — R109 Unspecified abdominal pain: Secondary | ICD-10-CM | POA: Diagnosis not present

## 2023-10-18 DIAGNOSIS — J439 Emphysema, unspecified: Secondary | ICD-10-CM | POA: Diagnosis not present

## 2023-10-18 DIAGNOSIS — R110 Nausea: Secondary | ICD-10-CM | POA: Insufficient documentation

## 2023-10-18 DIAGNOSIS — I700 Atherosclerosis of aorta: Secondary | ICD-10-CM | POA: Diagnosis not present

## 2023-10-18 HISTORY — DX: Malignant (primary) neoplasm, unspecified: C80.1

## 2023-10-18 LAB — LIPASE, BLOOD: Lipase: 31 U/L (ref 11–51)

## 2023-10-18 LAB — I-STAT CHEM 8, ED
BUN: 5 mg/dL — ABNORMAL LOW (ref 8–23)
Calcium, Ion: 1.21 mmol/L (ref 1.15–1.40)
Chloride: 106 mmol/L (ref 98–111)
Creatinine, Ser: 0.8 mg/dL (ref 0.44–1.00)
Glucose, Bld: 107 mg/dL — ABNORMAL HIGH (ref 70–99)
HCT: 38 % (ref 36.0–46.0)
Hemoglobin: 12.9 g/dL (ref 12.0–15.0)
Potassium: 3.2 mmol/L — ABNORMAL LOW (ref 3.5–5.1)
Sodium: 143 mmol/L (ref 135–145)
TCO2: 23 mmol/L (ref 22–32)

## 2023-10-18 LAB — CBC WITH DIFFERENTIAL/PLATELET
Abs Immature Granulocytes: 0.02 10*3/uL (ref 0.00–0.07)
Basophils Absolute: 0.1 10*3/uL (ref 0.0–0.1)
Basophils Relative: 1 %
Eosinophils Absolute: 0.3 10*3/uL (ref 0.0–0.5)
Eosinophils Relative: 3 %
HCT: 38.8 % (ref 36.0–46.0)
Hemoglobin: 13 g/dL (ref 12.0–15.0)
Immature Granulocytes: 0 %
Lymphocytes Relative: 16 %
Lymphs Abs: 1.3 10*3/uL (ref 0.7–4.0)
MCH: 32.2 pg (ref 26.0–34.0)
MCHC: 33.5 g/dL (ref 30.0–36.0)
MCV: 96 fL (ref 80.0–100.0)
Monocytes Absolute: 0.6 10*3/uL (ref 0.1–1.0)
Monocytes Relative: 7 %
Neutro Abs: 5.7 10*3/uL (ref 1.7–7.7)
Neutrophils Relative %: 73 %
Platelets: 177 10*3/uL (ref 150–400)
RBC: 4.04 MIL/uL (ref 3.87–5.11)
RDW: 13.2 % (ref 11.5–15.5)
WBC: 7.9 10*3/uL (ref 4.0–10.5)
nRBC: 0 % (ref 0.0–0.2)

## 2023-10-18 LAB — HEPATIC FUNCTION PANEL
ALT: 10 U/L (ref 0–44)
AST: 13 U/L — ABNORMAL LOW (ref 15–41)
Albumin: 3.6 g/dL (ref 3.5–5.0)
Alkaline Phosphatase: 114 U/L (ref 38–126)
Bilirubin, Direct: 0.2 mg/dL (ref 0.0–0.2)
Indirect Bilirubin: 0.2 mg/dL — ABNORMAL LOW (ref 0.3–0.9)
Total Bilirubin: 0.4 mg/dL (ref 0.0–1.2)
Total Protein: 7.1 g/dL (ref 6.5–8.1)

## 2023-10-18 MED ORDER — SODIUM CHLORIDE 0.9 % IV BOLUS
1000.0000 mL | Freq: Once | INTRAVENOUS | Status: AC
  Administered 2023-10-18: 1000 mL via INTRAVENOUS

## 2023-10-18 MED ORDER — ONDANSETRON HCL 4 MG/2ML IJ SOLN
4.0000 mg | Freq: Once | INTRAMUSCULAR | Status: AC
  Administered 2023-10-18: 4 mg via INTRAVENOUS
  Filled 2023-10-18: qty 2

## 2023-10-18 NOTE — ED Triage Notes (Signed)
 Pt with c/o nausea and fatigue, recent staph infection to rt ear.  Loss of appetite. + hot flash

## 2023-10-18 NOTE — Discharge Instructions (Signed)
 Make sure you take the medicines that were prescribed on Friday.  Follow-up with your family doctor next week if not getting better and I will refer you to a GI doctor to follow-up with to

## 2023-10-18 NOTE — ED Provider Notes (Signed)
 [place] EMERGENCY DEPARTMENT AT [facility] Provider Note   CSN: [id number] Arrival date & time: 10/18/23  1803     History {Add pertinent medical, surgical, social history, OB history to HPI:1} Chief Complaint  Patient presents with   Nausea    Dawn Meyer is a 68 y.o. female.  Patient complains of nausea.  She has recently been put on antibiotics for an ear infection   Abdominal Pain      Home Medications Prior to Admission medications   Medication Sig Start Date End Date Taking? Authorizing Provider  Acetaminophen  (TYLENOL  ARTHRITIS PAIN PO) Take by mouth as needed.    [provider]  albuterol  (VENTOLIN  HFA) 108 (90 Base) MCG/ACT inhaler INHALE TWO PUFFS INTO THE LUNGS EVERY SIX HOURS AS NEEDED FOR WHEEZING OR SHORTNESS OF BREATH 05/13/23   Cook, Jayce G, DO  amLODipine  (NORVASC ) 10 MG tablet Take 1 tablet (10 mg total) by mouth daily. 11/11/22   Cook, Jayce G, DO  clonazePAM  (KLONOPIN ) 0.5 MG tablet Take 1 tablet (0.5 mg total) by mouth 2 (two) times daily as needed for anxiety. 09/17/23   Cook, Jayce G, DO  Fluticasone -Umeclidin-Vilant (TRELEGY ELLIPTA ) 100-62.5-25 MCG/ACT AEPB Inhale 1 puff into the lungs daily. 05/13/23   Cook, Jayce G, DO  omeprazole (PRILOSEC) 20 MG capsule Take 1 capsule (20 mg total) by mouth daily. 10/16/23   Horton, Vonzella Guernsey, MD  ondansetron  (ZOFRAN -ODT) 4 MG disintegrating tablet Take 1 tablet (4 mg total) by mouth every 8 (eight) hours as needed for nausea or vomiting. 10/14/23   Cook, Jayce G, DO  promethazine (PHENERGAN) 25 MG tablet Take 1 tablet (25 mg total) by mouth every 6 (six) hours as needed for nausea or vomiting. 10/16/23   Horton, Vonzella Guernsey, MD  sertraline (ZOLOFT) 50 MG tablet Take 1 tablet (50 mg total) by mouth daily. 10/14/23   Cook, Jayce G, DO      Allergies    Doxycycline  and Aquaphor [lanolin-petrolatum ]    Review of Systems   Review of Systems  Gastrointestinal:  Positive for abdominal pain.     Physical Exam Updated Vital Signs BP (!) 156/74 (BP Location: Right Arm)   Pulse 73   Temp 97.7 F (36.5 C) (Oral)   Resp 18   Ht 5' 5.5" (1.664 m)   Wt 61.7 kg   SpO2 100%   BMI 22.29 kg/m  Physical Exam  ED Results / Procedures / Treatments   Labs (all labs ordered are listed, but only abnormal results are displayed) Labs Reviewed  HEPATIC FUNCTION PANEL - Abnormal; Notable for the following components:      Result Value   AST 13 (*)    Indirect Bilirubin 0.2 (*)    All other components within normal limits  I-STAT CHEM 8, ED - Abnormal; Notable for the following components:   Potassium 3.2 (*)    BUN 5 (*)    Glucose, Bld 107 (*)    All other components within normal limits  CBC WITH DIFFERENTIAL/PLATELET  LIPASE, BLOOD    EKG None  Radiology DG ABD ACUTE 2+V W 1V CHEST Result Date: 10/18/2023 CLINICAL DATA:  pain EXAM: DG ABDOMEN ACUTE WITH 1 VIEW CHEST COMPARISON:  CT chest 09/15/2023 FINDINGS: The heart and mediastinal contours are within normal limits. Atherosclerotic plaque. Biapical pleural/pulmonary scarring. No focal consolidation. Chronic coarsened interstitial markings with no overt pulmonary edema. No pleural effusion. No pneumothorax. There is no evidence of dilated bowel loops or free intraperitoneal  air. No radiopaque calculi or other significant radiographic abnormality is seen. Levoscoliosis of the lumbar spine. Severe degenerative changes of the L2-L3 level. IMPRESSION: 1. No acute cardiopulmonary disease. 2. Nonobstructive bowel gas pattern. 3. Aortic Atherosclerosis (ICD10-I70.0) and Emphysema (ICD10-J43.9). Electronically Signed   By: Morgane  Naveau M.D.   On: 10/18/2023 21:33    Procedures Procedures  {Document cardiac monitor, telemetry assessment procedure when appropriate:1}  Medications Ordered in ED Medications  sodium chloride  0.9 % bolus 1,000 mL (0 mLs Intravenous Stopped 10/18/23 2047)  ondansetron  (ZOFRAN ) injection 4 mg (4 mg  Intravenous Given 10/18/23 1917)    ED Course/ Medical Decision Making/ A&P  Labs and x-rays unremarkable {   Click here for ABCD2, HEART and other calculatorsREFRESH Note before signing :1}                              Medical Decision Making Amount and/or Complexity of Data Reviewed Labs: ordered. Radiology: ordered.  Risk Prescription drug management.   Patient with persistent nausea.  She will take the acid medicine and nausea medicine he was given on Friday and follow-up with her PCP  {Document critical care time when appropriate:1} {Document review of labs and clinical decision tools ie heart score, Chads2Vasc2 etc:1}  {Document your independent review of radiology images, and any outside records:1} {Document your discussion with family members, caretakers, and with consultants:1} {Document social determinants of health affecting pt's care:1} {Document your decision making why or why not admission, treatments were needed:1} Final Clinical Impression(s) / ED Diagnoses Final diagnoses:  Nausea    Rx / DC Orders ED Discharge Orders     None

## 2023-10-20 DIAGNOSIS — R110 Nausea: Secondary | ICD-10-CM | POA: Diagnosis not present

## 2023-10-20 DIAGNOSIS — T66XXXD Radiation sickness, unspecified, subsequent encounter: Secondary | ICD-10-CM | POA: Diagnosis not present

## 2023-10-20 DIAGNOSIS — H60391 Other infective otitis externa, right ear: Secondary | ICD-10-CM | POA: Diagnosis not present

## 2023-10-20 DIAGNOSIS — R3589 Other polyuria: Secondary | ICD-10-CM | POA: Diagnosis not present

## 2023-10-20 DIAGNOSIS — Q181 Preauricular sinus and cyst: Secondary | ICD-10-CM | POA: Diagnosis not present

## 2023-10-20 DIAGNOSIS — A4901 Methicillin susceptible Staphylococcus aureus infection, unspecified site: Secondary | ICD-10-CM | POA: Diagnosis not present

## 2023-10-22 ENCOUNTER — Ambulatory Visit: Payer: Self-pay

## 2023-10-22 ENCOUNTER — Ambulatory Visit: Admitting: Family Medicine

## 2023-10-22 VITALS — BP 128/82 | HR 84 | Temp 98.1°F | Ht 65.5 in | Wt 136.0 lb

## 2023-10-22 DIAGNOSIS — E876 Hypokalemia: Secondary | ICD-10-CM | POA: Insufficient documentation

## 2023-10-22 DIAGNOSIS — F419 Anxiety disorder, unspecified: Secondary | ICD-10-CM | POA: Diagnosis not present

## 2023-10-22 MED ORDER — POTASSIUM CHLORIDE CRYS ER 20 MEQ PO TBCR
20.0000 meq | EXTENDED_RELEASE_TABLET | Freq: Two times a day (BID) | ORAL | 0 refills | Status: DC
Start: 2023-10-22 — End: 2023-10-28

## 2023-10-22 NOTE — Assessment & Plan Note (Signed)
 BMP today. Kdur as prescribed.

## 2023-10-22 NOTE — Telephone Encounter (Signed)
  Chief Complaint: lethargy Symptoms: weakness, tired,  Frequency: ongoing, was seen in the clinic this AM, states worse since appt this AM Pertinent Negatives: Patient denies fever, vomiting, diarrhea Disposition: [x] ED /[] Urgent Care (no appt availability in office) / [] Appointment(In office/virtual)/ []  [facility]/ [] Home Care/ [] Refused Recommended Disposition /[] [facility]/ []  Follow-up with PCP Additional Notes: Pt states that she was seen this morning in clinic. Pt states that she has gotten worse since leaving the clinic. Pt states that she feels "lifeless". Pt asking if lab results are back, advised they are not. Pt advised to seek ED treatment, pt would like Dr to advise her which ED.  Copied from CRM [id number]. Topic: Clinical - Red Word Triage >> Oct 22, 2023  4:16 PM Ethelle Herb L wrote: Red Word that prompted transfer to Nurse Triage: pt states she is feeling terrible, lethargic. Pt did see Dr. Debrah Fan earlier today but states she is feeling very terrible.   Pt wanted to speak to Dr. Debrah Fan directly if possible Reason for Disposition  [1] MODERATE weakness (i.e., interferes with work, school, normal activities) AND [2] cause unknown  (Exceptions: Weakness from acute minor illness or poor fluid intake; weakness is chronic and not worse.)  Answer Assessment - Initial Assessment Questions 1. DESCRIPTION: "Describe how you are feeling."     Lethargic,  2. SEVERITY: "How bad is it?"  "Can you stand and walk?"   - MILD (0-3): Feels weak or tired, but does not interfere with work, school or normal activities.   - MODERATE (4-7): Able to stand and walk; weakness interferes with work, school, or normal activities.   - SEVERE (8-10): Unable to stand or walk; unable to do usual activities.     Can still stand and walk but it is difficult 3. ONSET: "When did these symptoms begin?" (e.g., hours, days, weeks, months)     Worse in last week 4. CAUSE: "What do you think is causing  the weakness or fatigue?" (e.g., not drinking enough fluids, medical problem, trouble sleeping)     Unsure, may be the cancer treatments, but the last one was in april 5. NEW MEDICINES:  "Have you started on any new medicines recently?" (e.g., opioid pain medicines, benzodiazepines, muscle relaxants, antidepressants, antihistamines, neuroleptics, beta blockers)     denies 6. OTHER SYMPTOMS: "Do you have any other symptoms?" (e.g., chest pain, fever, cough, SOB, vomiting, diarrhea, bleeding, other areas of pain)     Pt states that she felt nauseous after getting home  Protocols used: Weakness (Generalized) and Fatigue-A-AH

## 2023-10-22 NOTE — Assessment & Plan Note (Signed)
 Stopping Zoloft due to potential side effects. Continue Klonopin .

## 2023-10-22 NOTE — Patient Instructions (Signed)
 Hold the Zoloft.  Lab today.   Use the Klonopin  as directed.

## 2023-10-22 NOTE — Progress Notes (Signed)
 Subjective:  Patient ID: Dawn Meyer, female    DOB: June 06, 1955  Age: 68 y.o. MRN: [id number]  CC:   Chief Complaint  Patient presents with   Hospitalization Follow-up    Nausea, nervousness, dehydration, fatigue    HPI:  68 year old female presents for ED follow up.  Has had ongoing nausea and overall not feeling well. Has been seen in the ER twice in the past week and has seen ENT.  She reports that she continues to feel poorly. Poor appetite. No current nausea. Significant fatigue and anxiousness. Labs in the ER notable for hypokalemia. Needs repeat assessment. No fever. No abdominal pain. No other complaints at this time.  Patient Active Problem List   Diagnosis Date Noted   Hypokalemia 10/22/2023   Coronary artery disease involving native coronary artery of native heart without angina pectoris 09/18/2023   Anxiety 09/18/2023   SOB (shortness of breath) 09/18/2023   Squamous cell carcinoma of skin of face 07/22/2023   Acquired facial deformity 06/24/2023   Hyperlipidemia 05/14/2023   COPD (chronic obstructive pulmonary disease) (HCC) 07/02/2022   Essential hypertension 10/10/2021   Allergic rhinitis 11/17/2017   Tobacco abuse 11/17/2017   Rheumatoid arthritis involving multiple sites with positive rheumatoid factor (HCC) 08/18/2016   OP (osteoporosis) 08/18/2016    Social Hx   Social History   Socioeconomic History   Marital status: Married    Spouse name: Not on file   Number of children: Not on file   Years of education: Not on file   Highest education level: Not on file  Occupational History   Not on file  Tobacco Use   Smoking status: Every Day    Current packs/day: 0.15    Average packs/day: 0.2 packs/day for 40.0 years (6.0 ttl pk-yrs)    Types: Cigarettes    Passive exposure: Never   Smokeless tobacco: Never  Vaping Use   Vaping status: Never Used  Substance and Sexual Activity   Alcohol use: No   Drug use: No   Sexual activity: Not  Currently    Birth control/protection: Post-menopausal  Other Topics Concern   Not on file  Social History Narrative   Not on file   Social Drivers of Health   Financial Resource Strain: Not on file  Food Insecurity: No Food Insecurity (07/22/2023)   Hunger Vital Sign    Worried About Running Out of Food in the Last Year: Never true    Ran Out of Food in the Last Year: Never true  Transportation Needs: No Transportation Needs (07/22/2023)   PRAPARE - Administrator, Civil Service (Medical): No    Lack of Transportation (Non-Medical): No  Physical Activity: Not on file  Stress: Not on file  Social Connections: Not on file    Review of Systems Per HPI  Objective:  BP 128/82   Pulse 84   Temp 98.1 F (36.7 C)   Ht 5' 5.5" (1.664 m)   Wt 136 lb (61.7 kg)   SpO2 98%   BMI 22.29 kg/m      10/22/2023   10:41 AM 10/18/2023   10:15 PM 10/18/2023    6:16 PM  BP/Weight  Systolic BP 128 141   Diastolic BP 82 73   Wt. (Lbs) 136  136.02  BMI 22.29 kg/m2  22.29 kg/m2    Physical Exam Vitals and nursing note reviewed.  Constitutional:      General: She is not in acute distress.  Comments: Appears fatigued.  Cardiovascular:     Rate and Rhythm: Normal rate and regular rhythm.  Pulmonary:     Effort: Pulmonary effort is normal.     Breath sounds: Normal breath sounds.  Abdominal:     General: There is no distension.     Palpations: Abdomen is soft.     Tenderness: There is no abdominal tenderness.  Neurological:     Mental Status: She is alert.  Psychiatric:     Comments: Flat affect. Depressed mood.     Lab Results  Component Value Date   WBC 7.9 10/18/2023   HGB 12.9 10/18/2023   HCT 38.0 10/18/2023   PLT 177 10/18/2023   GLUCOSE 107 (H) 10/18/2023   CHOL 172 01/08/2021   TRIG 122 01/08/2021   HDL 47 01/08/2021   LDLCALC 103 (H) 01/08/2021   ALT 10 10/18/2023   AST 13 (L) 10/18/2023   NA 143 10/18/2023   K 3.2 (L) 10/18/2023   CL 106  10/18/2023   CREATININE 0.80 10/18/2023   BUN 5 (L) 10/18/2023   CO2 21 (L) 10/16/2023   TSH 0.702 09/12/2017     Assessment & Plan:  Hypokalemia Assessment & Plan: BMP today. Kdur as prescribed.  Orders: -     Basic metabolic panel with GFR -     Potassium Chloride  Crys ER; Take 1 tablet (20 mEq total) by mouth 2 (two) times daily for 3 days.  Dispense: 6 tablet; Refill: 0  Anxiety Assessment & Plan: Stopping Zoloft  due to potential side effects. Continue Klonopin .      Follow-up:  Pending lab.  Kathleen Papa DO [facility]

## 2023-10-23 ENCOUNTER — Ambulatory Visit: Payer: Self-pay | Admitting: Family Medicine

## 2023-10-23 LAB — BASIC METABOLIC PANEL WITH GFR
BUN/Creatinine Ratio: 17 (ref 12–28)
BUN: 13 mg/dL (ref 8–27)
CO2: 22 mmol/L (ref 20–29)
Calcium: 9.4 mg/dL (ref 8.7–10.3)
Chloride: 104 mmol/L (ref 96–106)
Creatinine, Ser: 0.77 mg/dL (ref 0.57–1.00)
Glucose: 112 mg/dL — ABNORMAL HIGH (ref 70–99)
Potassium: 3.5 mmol/L (ref 3.5–5.2)
Sodium: 142 mmol/L (ref 134–144)
eGFR: 86 mL/min/{1.73_m2} (ref >59)

## 2023-10-28 ENCOUNTER — Emergency Department (HOSPITAL_COMMUNITY)

## 2023-10-28 ENCOUNTER — Emergency Department (HOSPITAL_COMMUNITY)
Admission: EM | Admit: 2023-10-28 | Discharge: 2023-10-29 | Disposition: A | Discharge status: Home / Self Care | Attending: Emergency Medicine | Admitting: Emergency Medicine

## 2023-10-28 ENCOUNTER — Other Ambulatory Visit: Payer: Self-pay

## 2023-10-28 ENCOUNTER — Encounter (HOSPITAL_COMMUNITY): Payer: Self-pay

## 2023-10-28 DIAGNOSIS — M79601 Pain in right arm: Secondary | ICD-10-CM | POA: Diagnosis not present

## 2023-10-28 DIAGNOSIS — Z79899 Other long term (current) drug therapy: Secondary | ICD-10-CM | POA: Diagnosis not present

## 2023-10-28 DIAGNOSIS — R0602 Shortness of breath: Secondary | ICD-10-CM | POA: Diagnosis not present

## 2023-10-28 DIAGNOSIS — J449 Chronic obstructive pulmonary disease, unspecified: Secondary | ICD-10-CM | POA: Diagnosis not present

## 2023-10-28 DIAGNOSIS — Z720 Tobacco use: Secondary | ICD-10-CM | POA: Insufficient documentation

## 2023-10-28 DIAGNOSIS — E876 Hypokalemia: Secondary | ICD-10-CM | POA: Insufficient documentation

## 2023-10-28 DIAGNOSIS — I10 Essential (primary) hypertension: Secondary | ICD-10-CM | POA: Diagnosis not present

## 2023-10-28 DIAGNOSIS — R0600 Dyspnea, unspecified: Secondary | ICD-10-CM | POA: Diagnosis not present

## 2023-10-28 LAB — CBC
HCT: 39.6 % (ref 36.0–46.0)
Hemoglobin: 13.5 g/dL (ref 12.0–15.0)
MCH: 32.4 pg (ref 26.0–34.0)
MCHC: 34.1 g/dL (ref 30.0–36.0)
MCV: 95 fL (ref 80.0–100.0)
Platelets: 206 10*3/uL (ref 150–400)
RBC: 4.17 MIL/uL (ref 3.87–5.11)
RDW: 13.4 % (ref 11.5–15.5)
WBC: 8.6 10*3/uL (ref 4.0–10.5)
nRBC: 0 % (ref 0.0–0.2)

## 2023-10-28 LAB — BASIC METABOLIC PANEL WITH GFR
Anion gap: 15 (ref 5–15)
BUN: 9 mg/dL (ref 8–23)
CO2: 20 mmol/L — ABNORMAL LOW (ref 22–32)
Calcium: 9.9 mg/dL (ref 8.9–10.3)
Chloride: 104 mmol/L (ref 98–111)
Creatinine, Ser: 0.75 mg/dL (ref 0.44–1.00)
GFR, Estimated: >60 mL/min (ref >60)
Glucose, Bld: 133 mg/dL — ABNORMAL HIGH (ref 70–99)
Potassium: 3 mmol/L — ABNORMAL LOW (ref 3.5–5.1)
Sodium: 139 mmol/L (ref 135–145)

## 2023-10-28 LAB — TROPONIN I (HIGH SENSITIVITY): Troponin I (High Sensitivity): 3 ng/L (ref <18)

## 2023-10-28 MED ORDER — POTASSIUM CHLORIDE 10 MEQ/100ML IV SOLN
10.0000 meq | Freq: Once | INTRAVENOUS | Status: AC
  Administered 2023-10-28: 10 meq via INTRAVENOUS
  Filled 2023-10-28: qty 100

## 2023-10-28 MED ORDER — ALUM & MAG HYDROXIDE-SIMETH 200-200-20 MG/5ML PO SUSP
30.0000 mL | Freq: Once | ORAL | Status: AC
  Administered 2023-10-28: 30 mL via ORAL
  Filled 2023-10-28: qty 30

## 2023-10-28 MED ORDER — POTASSIUM CHLORIDE CRYS ER 20 MEQ PO TBCR
20.0000 meq | EXTENDED_RELEASE_TABLET | Freq: Two times a day (BID) | ORAL | 0 refills | Status: DC
Start: 2023-10-28 — End: 2023-11-02

## 2023-10-28 MED ORDER — IPRATROPIUM-ALBUTEROL 0.5-2.5 (3) MG/3ML IN SOLN
3.0000 mL | Freq: Once | RESPIRATORY_TRACT | Status: AC
  Administered 2023-10-28: 3 mL via RESPIRATORY_TRACT
  Filled 2023-10-28: qty 3

## 2023-10-28 NOTE — ED Provider Notes (Signed)
 [facility] Provider Note   CSN: [id number] Arrival date & time: 10/28/23  2143     History  Chief Complaint  Patient presents with   Shortness of Breath    Dawn Meyer is a 68 y.o. female.  Patient with history of COPD, continuous smoker, facial skin CA s/p radiation, GERD, HTN, RA presents with symptoms all day today of indigestion described as excessive belching, stomach unsettled. She also has been SOB today without cough or fever. She started having pain in her right arm described as an ache which prompted ED evaluation. No history of heart disease.    Shortness of Breath      Home Medications Prior to Admission medications   Medication Sig Start Date End Date Taking? Authorizing Provider  Acetaminophen  (TYLENOL  ARTHRITIS PAIN PO) Take by mouth as needed.    [provider]  albuterol  (VENTOLIN  HFA) 108 (90 Base) MCG/ACT inhaler INHALE TWO PUFFS INTO THE LUNGS EVERY SIX HOURS AS NEEDED FOR WHEEZING OR SHORTNESS OF BREATH 05/13/23   Cook, Jayce G, DO  amLODipine  (NORVASC ) 10 MG tablet Take 1 tablet (10 mg total) by mouth daily. 11/11/22   Cook, Jayce G, DO  cephALEXin  (KEFLEX ) 500 MG capsule Take 500 mg by mouth 4 (four) times daily. 10/13/23   [provider]  clonazePAM  (KLONOPIN ) 0.5 MG tablet Take 1 tablet (0.5 mg total) by mouth 2 (two) times daily as needed for anxiety. 09/17/23   Cook, Jayce G, DO  FLUoxetine  (PROZAC ) 10 MG capsule Take 10 mg by mouth daily. 09/15/23   [provider]  Fluticasone -Umeclidin-Vilant (TRELEGY ELLIPTA ) 100-62.5-25 MCG/ACT AEPB Inhale 1 puff into the lungs daily. 05/13/23   Cook, Jayce G, DO  omeprazole  (PRILOSEC) 20 MG capsule Take 1 capsule (20 mg total) by mouth daily. 10/16/23   Horton, Vonzella Guernsey, MD  ondansetron  (ZOFRAN -ODT) 4 MG disintegrating tablet Take 1 tablet (4 mg total) by mouth every 8 (eight) hours as needed for nausea or vomiting. 10/14/23   Cook, Jayce  G, DO  potassium chloride  SA (KLOR-CON  M) 20 MEQ tablet Take 1 tablet (20 mEq total) by mouth 2 (two) times daily for 3 days. 10/28/23 10/31/23  Mandy Second, PA-C  promethazine  (PHENERGAN ) 25 MG tablet Take 1 tablet (25 mg total) by mouth every 6 (six) hours as needed for nausea or vomiting. 10/16/23   Horton, Vonzella Guernsey, MD  sulfamethoxazole-trimethoprim (BACTRIM DS) 800-160 MG tablet Take 1 tablet by mouth 2 (two) times daily. 10/08/23   [provider]  tobramycin-dexamethasone  Jacquelyne Matte) ophthalmic solution Place 4 drops into the right ear 2 (two) times daily as needed (ear drainage). 10/20/23   [provider]      Allergies    Doxycycline  and Aquaphor [lanolin-petrolatum ]    Review of Systems   Review of Systems  Respiratory:  Positive for shortness of breath.     Physical Exam Updated Vital Signs BP (!) 149/79   Pulse 71   Temp 98 F (36.7 C)   Resp 15   Ht 5\' 5"  (1.651 m)   Wt 61.7 kg   SpO2 99%   BMI 22.63 kg/m  Physical Exam Constitutional:      Appearance: She is well-developed.  HENT:     Head: Normocephalic.  Cardiovascular:     Rate and Rhythm: Normal rate and regular rhythm.     Heart sounds: No murmur heard. Pulmonary:     Effort: Tachypnea present.     Breath  sounds: Normal breath sounds. No wheezing, rhonchi or rales.  Chest:     Chest wall: No tenderness.  Abdominal:     General: Bowel sounds are normal.     Palpations: Abdomen is soft.     Tenderness: There is no abdominal tenderness. There is no guarding or rebound.  Musculoskeletal:        General: Normal range of motion.     Cervical back: Normal range of motion and neck supple.  Skin:    General: Skin is warm and dry.  Neurological:     General: No focal deficit present.     Mental Status: She is alert and oriented to person, place, and time.     ED Results / Procedures / Treatments   Labs (all labs ordered are listed, but only abnormal results are displayed) Labs  Reviewed  BASIC METABOLIC PANEL WITH GFR - Abnormal; Notable for the following components:      Result Value   Potassium 3.0 (*)    CO2 20 (*)    Glucose, Bld 133 (*)    All other components within normal limits  CBC  TROPONIN I (HIGH SENSITIVITY)  TROPONIN I (HIGH SENSITIVITY)   Results for orders placed or performed during the hospital encounter of 10/28/23  Basic metabolic panel   Collection Time: 10/28/23 10:02 PM  Result Value Ref Range   Sodium 139 135 - 145 mmol/L   Potassium 3.0 (L) 3.5 - 5.1 mmol/L   Chloride 104 98 - 111 mmol/L   CO2 20 (L) 22 - 32 mmol/L   Glucose, Bld 133 (H) 70 - 99 mg/dL   BUN 9 8 - 23 mg/dL   Creatinine, Ser 1.47 0.44 - 1.00 mg/dL   Calcium 9.9 8.9 - 82.9 mg/dL   GFR, Estimated >56 >21 mL/min   Anion gap 15 5 - 15  CBC   Collection Time: 10/28/23 10:02 PM  Result Value Ref Range   WBC 8.6 4.0 - 10.5 K/uL   RBC 4.17 3.87 - 5.11 MIL/uL   Hemoglobin 13.5 12.0 - 15.0 g/dL   HCT 30.8 65.7 - 84.6 %   MCV 95.0 80.0 - 100.0 fL   MCH 32.4 26.0 - 34.0 pg   MCHC 34.1 30.0 - 36.0 g/dL   RDW 96.2 95.2 - 84.1 %   Platelets 206 150 - 400 K/uL   nRBC 0.0 0.0 - 0.2 %  Troponin I (High Sensitivity)   Collection Time: 10/28/23 10:02 PM  Result Value Ref Range   Troponin I (High Sensitivity) 3 <18 ng/L    EKG EKG Interpretation Date/Time:  Wednesday October 28 2023 21:52:49 EDT Ventricular Rate:  81 PR Interval:  150 QRS Duration:  82 QT Interval:  400 QTC Calculation: 464 R Axis:   -56  Text Interpretation: Poor data quality in current ECG precludes serial comparison Confirmed by Sueellen Emery [phone]) on 10/28/2023 9:58:12 PM  Radiology No results found.  Procedures Procedures    Medications Ordered in ED Medications  potassium chloride  10 mEq in 100 mL IVPB (10 mEq Intravenous New Bag/Given 10/28/23 2258)  alum & mag hydroxide-simeth (MAALOX/MYLANTA) 200-200-20 MG/5ML suspension 30 mL (has no administration in time range)   ipratropium-albuterol  (DUONEB) 0.5-2.5 (3) MG/3ML nebulizer solution 3 mL (has no administration in time range)    ED Course/ Medical Decision Making/ A&P  Medical Decision Making This patient presents to the ED for concern of indigestion, SOB, arm pain, this involves an extensive number of treatment options, and is a complaint that carries with it a high risk of complications and morbidity.  The differential diagnosis includes PNA, PTX, PNA, CAD/ACS, reflux   Co morbidities that complicate the patient evaluation  COPD, continuous smoker, RA, HTN   Additional history obtained:  Additional history and/or information obtained from chart review, notable for no previous cardiology notes for review   Lab Tests:  I Ordered, and personally interpreted labs.  The pertinent results include:   Troponin 3 CBC - no leukocytosis, normal hgb, normal plts Bmet - K+ 3.0, CO2 20, glucose 133, otherwise WNL     Imaging Studies ordered:  I ordered imaging studies including cxr I independently visualized and interpreted imaging which showed appears unchanged from previous Awaiting Radiology interpretation   Cardiac Monitoring:  The patient was maintained on a cardiac monitor.  I personally viewed and interpreted the cardiac monitored which showed an underlying rhythm of:  EKG Interpretation Date/Time:  Wednesday October 28 2023 21:52:49 EDT Ventricular Rate:  81 PR Interval:  150 QRS Duration:  82 QT Interval:  400 QTC Calculation: 464 R Axis:   -56  Text Interpretation: Poor data quality in current ECG precludes serial comparison Confirmed by Sueellen Emery [id number]) on 10/28/2023 9:58:12 PM   Test Considered:  N/a   Critical Interventions:  N/a   Consultations Obtained:  I requested consultation with the n/an,  and discussed lab and imaging findings as well as pertinent plan - they recommend:n/a   Problem List / ED Course:  Presents with  SOB, right arm aching pain, indigestion (belching, stomach unsettled) Symptoms started earlier today.    Reevaluation:  After the interventions noted above, I reevaluated the patient and found that they have :improved   Social Determinants of Health:  Active smoker   Disposition:  After consideration of the diagnostic results and the patients response to treatment, I feel that the patient would benefit from - disposition to be determined by Dr. Lula Sale on re-evaluation after DuoNeb and GI cocktail. .   Amount and/or Complexity of Data Reviewed Labs: ordered. Radiology: ordered.  Risk OTC drugs. Prescription drug management.           Final Clinical Impression(s) / ED Diagnoses Final diagnoses:  Shortness of breath  Right arm pain  Hypokalemia    Rx / DC Orders ED Discharge Orders          Ordered    potassium chloride  SA (KLOR-CON  M) 20 MEQ tablet  2 times daily        10/28/23 2336              Mandy Second, PA-C 10/28/23 2337    Orvilla Blander, MD 10/29/23 0021

## 2023-10-28 NOTE — ED Triage Notes (Signed)
 Pt to ED from home c/o "sob, indigestion, and right arm pain off and on all day". Pt with hx of COPD. Pt with hx of skin CA, finished radiation in April.

## 2023-10-29 NOTE — Discharge Instructions (Signed)
 Begin taking Prilosec 20 mg once daily.  This medication is available over-the-counter.  Follow-up with cardiology as scheduled, and return to the ER if your symptoms significantly worsen or change.

## 2023-11-02 ENCOUNTER — Ambulatory Visit (INDEPENDENT_AMBULATORY_CARE_PROVIDER_SITE_OTHER): Admitting: Family Medicine

## 2023-11-02 DIAGNOSIS — F419 Anxiety disorder, unspecified: Secondary | ICD-10-CM

## 2023-11-02 DIAGNOSIS — J449 Chronic obstructive pulmonary disease, unspecified: Secondary | ICD-10-CM | POA: Diagnosis not present

## 2023-11-02 DIAGNOSIS — K219 Gastro-esophageal reflux disease without esophagitis: Secondary | ICD-10-CM | POA: Diagnosis not present

## 2023-11-02 DIAGNOSIS — E876 Hypokalemia: Secondary | ICD-10-CM | POA: Diagnosis not present

## 2023-11-02 DIAGNOSIS — R634 Abnormal weight loss: Secondary | ICD-10-CM | POA: Diagnosis not present

## 2023-11-02 MED ORDER — CLONAZEPAM 0.5 MG PO TABS: 0.5000 mg | ORAL_TABLET | Freq: Two times a day (BID) | ORAL | 1 refills | Status: DC | PRN

## 2023-11-02 MED ORDER — POTASSIUM CHLORIDE CRYS ER 20 MEQ PO TBCR: 20.0000 meq | EXTENDED_RELEASE_TABLET | Freq: Every day | ORAL | 0 refills | Status: DC

## 2023-11-02 MED ORDER — DRONABINOL 2.5 MG PO CAPS
2.5000 mg | ORAL_CAPSULE | Freq: Two times a day (BID) | ORAL | 3 refills | Status: DC
Start: 2023-11-02 — End: 2023-11-03

## 2023-11-02 MED ORDER — PANTOPRAZOLE SODIUM 40 MG PO TBEC: 40.0000 mg | DELAYED_RELEASE_TABLET | Freq: Every day | ORAL | 1 refills | Status: DC

## 2023-11-02 MED ORDER — ALBUTEROL SULFATE (2.5 MG/3ML) 0.083% IN NEBU
2.5000 mg | INHALATION_SOLUTION | Freq: Four times a day (QID) | RESPIRATORY_TRACT | 1 refills | Status: AC | PRN
Start: 2023-11-02 — End: ?

## 2023-11-02 NOTE — Assessment & Plan Note (Signed)
 Rx sent for K-Dur.

## 2023-11-02 NOTE — Assessment & Plan Note (Signed)
 Uncontrolled.  Starting PPI.

## 2023-11-02 NOTE — Assessment & Plan Note (Signed)
 Patient ongoing poor appetite and weight loss.  Tried Remeron  to help and she had adverse effects.  Sending in dronabinol to try and stimulate appetite and to help with ongoing nausea.

## 2023-11-02 NOTE — Patient Instructions (Signed)
 Meds sent in.  Lab next week to check potassium.  Follow up in 2 weeks.

## 2023-11-02 NOTE — Progress Notes (Signed)
 Oncology Nurse Navigator Documentation   Dawn Meyer called and left a VM with Dawn Meyer nurse Dawn Meyer reporting difficulty sleeping, eating, and that she was losing weight. She has seen ENT and her PCP for these symptoms but requested to see Dawn Meyer for management. Dawn Meyer asked that her CT neck planned for August be moved to next week and she would see her afterwards. Her CT was moved to 6/13 and she will see Dawn Meyer for evaluation and results on 6/18. Dawn Meyer is aware of all of the above and plans to come for the CT and follow up with Dawn Meyer. She is aware of the appointment dates and times. She has my direct number to call if she has any further questions or concerns.   Lynetta Saran RN, BSN, OCN Head & Neck Oncology Nurse Navigator [facility] Phone # [phone]  Fax # [phone]

## 2023-11-02 NOTE — Progress Notes (Signed)
 Subjective:  Patient ID: Dawn Meyer, female    DOB: [birth year]  Age: 68 y.o. MRN: [id number]  CC:   Chief Complaint  Patient presents with   Follow-up    Follow up ER 10/28/23, trouble eating, Diarrhea, sob, cough    HPI:  68 year old female presents with multiple complaints.  Patient continues to feel poorly.  She reports worsening shortness of breath.  Continues to smoke.  Patient states that she has used a family members albuterol  nebulizer and feels like this helps.  She endorses chest tightness and shortness of breath.  Has known COPD.  Uses Trelegy.  Patient is requesting a nebulizer machine and albuterol  nebs.  Patient reports ongoing diarrhea.  Has had hypokalemia.  Overall has poor appetite and decreased appetite.  This has been an ongoing issue since having skin cancer and radiation.  Patient recently in the ER with GERD.  Responded to GI cocktail.  Patient currently using Maalox.  Patient still having issues with her mood particularly anxiety.  Also having sleep issues.  Sleeping about 3 hours a night.  Patient Active Problem List   Diagnosis Date Noted   Weight loss 11/02/2023   GERD (gastroesophageal reflux disease) 11/02/2023   Hypokalemia 10/22/2023   Coronary artery disease involving native coronary artery of native heart without angina pectoris 09/18/2023   Anxiety 09/18/2023   SOB (shortness of breath) 09/18/2023   Squamous cell carcinoma of skin of face 07/22/2023   Acquired facial deformity 06/24/2023   Hyperlipidemia 05/14/2023   COPD (chronic obstructive pulmonary disease) (HCC) 07/02/2022   Essential hypertension 10/10/2021   Allergic rhinitis 11/17/2017   Tobacco abuse 11/17/2017   Rheumatoid arthritis involving multiple sites with positive rheumatoid factor (HCC) 08/18/2016   OP (osteoporosis) 08/18/2016    Social Hx   Social History   Socioeconomic History   Marital status: Married    Spouse name: Not on file   Number of children: Not  on file   Years of education: Not on file   Highest education level: Not on file  Occupational History   Not on file  Tobacco Use   Smoking status: Every Day    Current packs/day: 0.15    Average packs/day: 0.2 packs/day for 40.0 years (6.0 ttl pk-yrs)    Types: Cigarettes    Passive exposure: Never   Smokeless tobacco: Never  Vaping Use   Vaping status: Never Used  Substance and Sexual Activity   Alcohol use: No   Drug use: No   Sexual activity: Not Currently    Birth control/protection: Post-menopausal  Other Topics Concern   Not on file  Social History Narrative   Not on file   Social Drivers of Health   Financial Resource Strain: Not on file  Food Insecurity: No Food Insecurity (07/22/2023)   Hunger Vital Sign    Worried About Running Out of Food in the Last Year: Never true    Ran Out of Food in the Last Year: Never true  Transportation Needs: No Transportation Needs (07/22/2023)   PRAPARE - Transportation    Lack of Transportation (Medical): No    Lack of Transportation (Non-Medical): No  Physical Activity: Not on file  Stress: Not on file  Social Connections: Not on file    Review of Systems Per HPI  Objective:  BP (!) 162/84   Pulse 87   Temp 100 F (37.8 C)   Ht 5\' 5"  (1.651 m)   Wt 135 lb (61.2 kg)   SpO2 100%  BMI 22.47 kg/m      11/02/2023    3:58 PM 11/02/2023    3:55 PM 10/29/2023   12:15 AM  BP/Weight  Systolic BP 162 161 124  Diastolic BP 84 81 82  Wt. (Lbs)  135   BMI  22.47 kg/m2     Physical Exam Vitals and nursing note reviewed.  Constitutional:      General: She is not in acute distress. HENT:     Head: Normocephalic and atraumatic.  Cardiovascular:     Rate and Rhythm: Normal rate and regular rhythm.  Pulmonary:     Effort: Pulmonary effort is normal.     Breath sounds: No wheezing.  Neurological:     Mental Status: She is alert.  Psychiatric:     Comments: Flat affect.  Depressed mood.     Lab Results  Component  Value Date   WBC 8.6 10/28/2023   HGB 13.5 10/28/2023   HCT 39.6 10/28/2023   PLT 206 10/28/2023   GLUCOSE 133 (H) 10/28/2023   CHOL 172 01/08/2021   TRIG 122 01/08/2021   HDL 47 01/08/2021   LDLCALC 103 (H) 01/08/2021   ALT 10 10/18/2023   AST 13 (L) 10/18/2023   NA 139 10/28/2023   K 3.0 (L) 10/28/2023   CL 104 10/28/2023   CREATININE 0.75 10/28/2023   BUN 9 10/28/2023   CO2 20 (L) 10/28/2023   TSH 0.702 09/12/2017     Assessment & Plan:  Hypokalemia Assessment & Plan: Rx sent for K-Dur.  Orders: -     Potassium Chloride  Crys ER; Take 1 tablet (20 mEq total) by mouth daily.  Dispense: 30 tablet; Refill: 0 -     Basic metabolic panel with GFR  Chronic obstructive pulmonary disease, unspecified COPD type (HCC) Assessment & Plan: Advised patient that she needs to quit smoking.  Continue Trelegy.  Prescription given for nebulizer machine.  Rx sent for albuterol  nebs.  Orders: -     Albuterol  Sulfate; Take 3 mLs (2.5 mg total) by nebulization every 6 (six) hours as needed for wheezing or shortness of breath.  Dispense: 150 mL; Refill: 1  Anxiety Assessment & Plan: Clonazepam  refilled.  Orders: -     clonazePAM ; Take 1 tablet (0.5 mg total) by mouth 2 (two) times daily as needed for anxiety.  Dispense: 20 tablet; Refill: 1  Weight loss Assessment & Plan: Patient ongoing poor appetite and weight loss.  Tried Remeron  to help and she had adverse effects.  Sending in dronabinol to try and stimulate appetite and to help with ongoing nausea.  Orders: -     droNABinol; Take 1 capsule (2.5 mg total) by mouth 2 (two) times daily before a meal.  Dispense: 60 capsule; Refill: 3  Gastroesophageal reflux disease without esophagitis Assessment & Plan: Uncontrolled.  Starting PPI.  Orders: -     Pantoprazole Sodium; Take 1 tablet (40 mg total) by mouth daily.  Dispense: 90 tablet; Refill: 1    Follow-up:  Has follow up scheduled; BMP next week.  Kathleen Papa DO [facility]  Family Medicine

## 2023-11-02 NOTE — Assessment & Plan Note (Signed)
 Advised patient that she needs to quit smoking.  Continue Trelegy.  Prescription given for nebulizer machine.  Rx sent for albuterol  nebs.

## 2023-11-02 NOTE — Assessment & Plan Note (Signed)
 Clonazepam refilled

## 2023-11-03 MED ORDER — DRONABINOL 2.5 MG PO CAPS
2.5000 mg | ORAL_CAPSULE | Freq: Two times a day (BID) | ORAL | 3 refills | Status: DC
Start: 2023-11-03 — End: 2023-11-12

## 2023-11-03 NOTE — Addendum Note (Signed)
 Addended byHugo Maes on: 11/03/2023 09:09 AM   Modules accepted: Orders

## 2023-11-05 ENCOUNTER — Telehealth: Payer: Self-pay | Admitting: *Deleted

## 2023-11-05 DIAGNOSIS — Z08 Encounter for follow-up examination after completed treatment for malignant neoplasm: Secondary | ICD-10-CM | POA: Diagnosis not present

## 2023-11-05 DIAGNOSIS — L820 Inflamed seborrheic keratosis: Secondary | ICD-10-CM | POA: Diagnosis not present

## 2023-11-05 DIAGNOSIS — L570 Actinic keratosis: Secondary | ICD-10-CM | POA: Diagnosis not present

## 2023-11-05 DIAGNOSIS — Z85828 Personal history of other malignant neoplasm of skin: Secondary | ICD-10-CM | POA: Diagnosis not present

## 2023-11-05 DIAGNOSIS — X32XXXD Exposure to sunlight, subsequent encounter: Secondary | ICD-10-CM | POA: Diagnosis not present

## 2023-11-05 NOTE — Telephone Encounter (Signed)
 Cook, Jayce G, DO      11/05/23  8:58 AM I'm not sure what we can do about this.

## 2023-11-05 NOTE — Telephone Encounter (Signed)
 See new message 11/05/23 from patient

## 2023-11-05 NOTE — Telephone Encounter (Signed)
 Copied from CRM [id number]. Topic: Clinical - Prescription Issue >> Nov 05, 2023 11:46 AM Dawn Meyer wrote: Reason for CRM: Pt called in because she is having issues attempting to pick up the medication that was prescribed.   dronabinol (MARINOL) 2.5 MG capsule - Pt was advised by pharmacy that medication was on backorder and they attempted to call around to find which pharmacy might have the medication.  Pt was told by Pharmacy that they contacted [facility] and was told someone would find a pharmacy.   Pt has not picked up medication and would like know if there is another medication that can be prescribed or if there is a pharmacy that does have it in stock that she can go pick up medication from.

## 2023-11-05 NOTE — Telephone Encounter (Signed)
 Copied from CRM [id number]. Topic: Clinical - Prescription Issue >> Nov 03, 2023  5:00 PM Alpha Arts wrote: Reason for CRM: Several drug stores have had issues getting dronabinol (MARINOL) 2.5 MG capsule.  Callback #: [phone]

## 2023-11-06 ENCOUNTER — Encounter (HOSPITAL_COMMUNITY): Payer: Self-pay

## 2023-11-06 ENCOUNTER — Ambulatory Visit (HOSPITAL_COMMUNITY)
Admission: RE | Admit: 2023-11-06 | Discharge: 2023-11-06 | Disposition: A | Discharge status: Home / Self Care | Source: Ambulatory Visit | Attending: Radiology

## 2023-11-06 DIAGNOSIS — C44320 Squamous cell carcinoma of skin of unspecified parts of face: Secondary | ICD-10-CM | POA: Insufficient documentation

## 2023-11-06 DIAGNOSIS — M47812 Spondylosis without myelopathy or radiculopathy, cervical region: Secondary | ICD-10-CM | POA: Diagnosis not present

## 2023-11-06 MED ORDER — IOHEXOL 300 MG/ML  SOLN
75.0000 mL | Freq: Once | INTRAMUSCULAR | Status: AC | PRN
  Administered 2023-11-06: 75 mL via INTRAVENOUS

## 2023-11-06 MED ORDER — SODIUM CHLORIDE (PF) 0.9 % IJ SOLN
INTRAMUSCULAR | Status: AC
  Filled 2023-11-06: qty 50

## 2023-11-09 DIAGNOSIS — E876 Hypokalemia: Secondary | ICD-10-CM | POA: Diagnosis not present

## 2023-11-09 DIAGNOSIS — T66XXXD Radiation sickness, unspecified, subsequent encounter: Secondary | ICD-10-CM | POA: Diagnosis not present

## 2023-11-09 DIAGNOSIS — M952 Other acquired deformity of head: Secondary | ICD-10-CM | POA: Diagnosis not present

## 2023-11-09 DIAGNOSIS — G510 Bell's palsy: Secondary | ICD-10-CM | POA: Diagnosis not present

## 2023-11-09 DIAGNOSIS — A4901 Methicillin susceptible Staphylococcus aureus infection, unspecified site: Secondary | ICD-10-CM | POA: Diagnosis not present

## 2023-11-09 DIAGNOSIS — C4499 Other specified malignant neoplasm of skin, unspecified: Secondary | ICD-10-CM | POA: Diagnosis not present

## 2023-11-09 NOTE — Progress Notes (Signed)
 Ms. Dawn Meyer presents today in the clinic for a follow up. She completed radiation for Squamous Cell Carcinoma of the Skin on 08/24/2023.  Pain issues, if any: *** Using a feeding tube?: *** Weight changes, if any: *** Swallowing issues, if any: *** Smoking or chewing tobacco? *** Using fluoride toothpaste daily? *** Last ENT visit was on: *** Other notable issues, if any: ***

## 2023-11-10 ENCOUNTER — Ambulatory Visit: Payer: Self-pay | Admitting: Family Medicine

## 2023-11-10 LAB — BASIC METABOLIC PANEL WITH GFR
BUN/Creatinine Ratio: 19 (ref 12–28)
BUN: 15 mg/dL (ref 8–27)
CO2: 20 mmol/L (ref 20–29)
Calcium: 9.2 mg/dL (ref 8.7–10.3)
Chloride: 104 mmol/L (ref 96–106)
Creatinine, Ser: 0.8 mg/dL (ref 0.57–1.00)
Glucose: 116 mg/dL — ABNORMAL HIGH (ref 70–99)
Potassium: 4.3 mmol/L (ref 3.5–5.2)
Sodium: 137 mmol/L (ref 134–144)
eGFR: 82 mL/min/{1.73_m2} (ref >59)

## 2023-11-11 ENCOUNTER — Ambulatory Visit
Admission: RE | Admit: 2023-11-11 | Discharge: 2023-11-11 | Disposition: A | Discharge status: Home / Self Care | Source: Ambulatory Visit | Attending: Radiation Oncology | Admitting: Radiation Oncology

## 2023-11-11 ENCOUNTER — Ambulatory Visit: Admitting: Nutrition

## 2023-11-11 ENCOUNTER — Ambulatory Visit: Payer: Medicare PPO | Admitting: Family Medicine

## 2023-11-11 VITALS — BP 160/84 | HR 77 | Temp 97.9°F | Resp 20 | Ht 65.0 in | Wt 133.4 lb

## 2023-11-11 DIAGNOSIS — C44320 Squamous cell carcinoma of skin of unspecified parts of face: Secondary | ICD-10-CM

## 2023-11-11 NOTE — Progress Notes (Signed)
 Patient identified to be at risk for malnutrition on the MST secondary to poor appetite and weight loss.  68 yo female diagnosed with Squamous cell carcinoma of the skin S/p radiation treatment on April 25 and followed by Dr. Lurena Sally.  PMH includes COPD, GERD, HTN, RA, current smoker  Medications include Klonopin , Marinol , Protonix , Potassium Chloride   Labs include Glucose 116, Potassium 4.3  Height: 5'5  Weight: 133 pounds 6.4 oz June 18 135 pounds May 21 139 pounds April 22 156 pounds January 22  BMI:22.2  Confirms poor appetite and difficulty eating large amounts of food. Reports taste alterations. She prefers foods that taste sweet. She does not tolerate warm foods. She immediately loses her appetite when she smells them cooking. She drinks 1/2 carton of Ensure Max at breakfast and the other half later in the day. She snacks on donuts and cookies. Drinks 60-70 oz water daily. She does not sleep well at night. She was prescribed Marinol  but it is on national backorder so has been unable to pick up the prescription. Reviewed food journal which patient brought with her today. She really wants to regain some weight and feel better.  Nutrition Diagnosis: Unintended weight loss related to poor appetite, taste alterations, and diarrhea as evidenced by 15% weight loss over 6 months which is clinically significant.  Intervention: Educated to increase small high calorie, high protein foods throughout the day. Educated on importance of calories AND protein and provided examples for snacks. Recommend change ONS from Ensure Max to Ensure Complete. Try to drink 2 daily. Provided samples and coupons. Recommended patient begin baking soda and salt water gargle to improve taste. Provided nutrition fact sheets and contact number for questions. Provided contact number and encouraged her to reach out with further questions or concerns.  Monitoring, Evaluation, Goals: Increase calories and protein  to minimize loss of lean body mass and improve energy  Next Visit: Follow up as needed per patient/provider.  **Disclaimer: This note was dictated with voice recognition software. Similar sounding words can inadvertently be transcribed and this note may contain transcription errors which may not have been corrected upon publication of note.**

## 2023-11-11 NOTE — Progress Notes (Addendum)
 Radiation Oncology         [phone] ________________________________  Name: Dawn Meyer MRN: [id number]  Date: 11/11/2023  DOB: Feb 12, 1956  Follow-Up Visit Note  CC: Cook, Jayce G, DO  Cook, Jayce G, DO    Diagnosis and Prior Radiotherapy:       ICD-10-CM   1. Squamous cell carcinoma of skin of face  C44.320       ==========DELIVERED PLANS==========  First Treatment Date: 2023-08-24 Last Treatment Date: 2023-09-07   Plan Name: HN_R_PreAuri Site: Neck Right Technique: IMRT Mode: Photon Dose Per Fraction: 2.5 Gy Prescribed Dose (Delivered / Prescribed): 17.5 Gy / 17.5 Gy Prescribed Fxs (Delivered / Prescribed): 7 / 7   Plan Name: HN_R_PreAur:1 Site: Neck Right Technique: IMRT Mode: Photon Dose Per Fraction: 6 Gy Prescribed Dose (Delivered / Prescribed): 18 Gy / 18 Gy Prescribed Fxs (Delivered / Prescribed): 3 / 3   Cancer Staging  Squamous cell carcinoma of skin of face Staging form: Cutaneous Carcinoma of the Head and Neck, AJCC 8th Edition - Clinical stage from 07/22/2023: Stage II (cT2, cN0, cM0) - Signed by Colie Dawes, MD on 07/22/2023 Stage prefix: Initial diagnosis Extraosseous extension: Absent  Stage II (cT2, N0, M0) squamous cell carcinoma of the skin of the face; s/p MOH's excision and adjuvant radiation completed on 09/07/2023  CHIEF COMPLAINT:  Here for follow-up and surveillance of cutaneous squamous cell carcinoma of the skin of the face  Narrative:   Ms. Dawn Meyer presents today in the clinic for a follow up. She completed radiation for Squamous Cell Carcinoma of the Skin on 08/24/2023.   Pain issues, if any: Patient denies any pain Using a feeding tube?: None Weight changes, if any: lost 13 lb since March Swallowing issues, if any: Yes,  Smoking or chewing tobacco? Yes, actively trying to quit Using fluoride toothpaste daily? Yes Last ENT visit was on: November 09, 2023 Other notable issues, if any:  Patient has insomnia /// recovering  from diarrhea that occurred w/ ABX use. Taste changes. Anxiety.   BP (!) 160/84 (BP Location: Right Arm, Patient Position: Sitting, Cuff Size: Normal)   Pulse 77   Temp 97.9 F (36.6 C)   Resp 20   Ht 5' 5 (1.651 m)   Wt 133 lb 6.4 oz (60.5 kg)   SpO2 100%   BMI 22.20 kg/m     Wt Readings from Last 3 Encounters:  11/11/23 133 lb 6.4 oz (60.5 kg)  11/02/23 135 lb (61.2 kg)  10/28/23 136 lb (61.7 kg)     ALLERGIES:  is allergic to doxycycline  and aquaphor [lanolin-petrolatum ].  Meds: Current Outpatient Medications  Medication Sig Dispense Refill   Acetaminophen  (TYLENOL  ARTHRITIS PAIN PO) Take by mouth as needed.     albuterol  (PROVENTIL ) (2.5 MG/3ML) 0.083% nebulizer solution Take 3 mLs (2.5 mg total) by nebulization every 6 (six) hours as needed for wheezing or shortness of breath. 150 mL 1   albuterol  (VENTOLIN  HFA) 108 (90 Base) MCG/ACT inhaler INHALE TWO PUFFS INTO THE LUNGS EVERY SIX HOURS AS NEEDED FOR WHEEZING OR SHORTNESS OF BREATH 18 g 3   amLODipine  (NORVASC ) 10 MG tablet Take 1 tablet (10 mg total) by mouth daily. 90 tablet 3   clonazePAM  (KLONOPIN ) 0.5 MG tablet Take 1 tablet (0.5 mg total) by mouth 2 (two) times daily as needed for anxiety. 20 tablet 1   dronabinol  (MARINOL ) 2.5 MG capsule Take 1 capsule (2.5 mg total) by mouth 2 (two) times daily before a meal. 60  capsule 3   Fluticasone -Umeclidin-Vilant (TRELEGY ELLIPTA ) 100-62.5-25 MCG/ACT AEPB Inhale 1 puff into the lungs daily. 1 each 11   pantoprazole  (PROTONIX ) 40 MG tablet Take 1 tablet (40 mg total) by mouth daily. 90 tablet 1   potassium chloride  SA (KLOR-CON  M) 20 MEQ tablet Take 1 tablet (20 mEq total) by mouth daily. 30 tablet 0   tobramycin-dexamethasone  (TOBRADEX) ophthalmic solution Place 4 drops into the right ear 2 (two) times daily as needed (ear drainage).     No current facility-administered medications for this encounter.    Physical Findings: The patient is in no acute distress. Patient is  alert and oriented. She weighed 146 lb on March 17th Wt Readings from Last 3 Encounters:  11/11/23 133 lb 6.4 oz (60.5 kg)  11/02/23 135 lb (61.2 kg)  10/28/23 136 lb (61.7 kg)    height is 5' 5 (1.651 m) and weight is 133 lb 6.4 oz (60.5 kg). Her temperature is 97.9 F (36.6 C). Her blood pressure is 160/84 (abnormal) and her pulse is 77. Her respiration is 20 and oxygen saturation is 100%. .  General: Alert and oriented, in no acute distress HEENT: Head is normocephalic. Extraocular movements are intact. Throat is clear. No discharge from right ear canal - no sign of infection in right ear Neck:  Minimal swelling, R neck.  No visible or palpable masses. Skin: chronic sun damage diffusely without nodules or lesions in radiation fields of R neck /cheek Heart RRR Chest CTAB Extremities: No cyanosis or edema. Lymphatics: see Neck Exam Psychiatric: Judgment and insight are intact. Affect is appropriate.   Lab Findings: Lab Results  Component Value Date   WBC 8.6 10/28/2023   HGB 13.5 10/28/2023   HCT 39.6 10/28/2023   MCV 95.0 10/28/2023   PLT 206 10/28/2023    Lab Results  Component Value Date   TSH 0.702 09/12/2017    Radiographic Findings: CT Soft Tissue Neck W Contrast Result Date: 11/07/2023 CLINICAL DATA:  Metastatic disease evaluation. Squamous cell carcinoma of the skin of the face status post excision and adjuvant radiation therapy. EXAM: CT NECK WITH CONTRAST TECHNIQUE: Multidetector CT imaging of the neck was performed using the standard protocol following the bolus administration of intravenous contrast. RADIATION DOSE REDUCTION: This exam was performed according to the departmental dose-optimization program which includes automated exposure control, adjustment of the mA and/or kV according to patient size and/or use of iterative reconstruction technique. CONTRAST:  75mL OMNIPAQUE  IOHEXOL  300 MG/ML  SOLN COMPARISON:  CT neck 06/12/2023 FINDINGS: Pharynx and larynx: No  evidence of a mass or significant swelling. Patent airway. Salivary glands: Interval postsurgical changes in the right parotid region for resection of a skin malignancy. No evidence of a residual/locally recurrent mass. Asymmetric, non-masslike hyperenhancement of the right parotid gland and right submandibular gland may reflect post radiation changes. The left parotid and left submandibular glands are unremarkable. Thyroid : Similar appearance of scattered subcentimeter nodules bilaterally for which no follow-up imaging is recommended. Lymph nodes: Subcentimeter short axis right level II and III lymph nodes are all stable or smaller than on the prior study, within an example level III node measuring 7 x 6 x 16 mm (series 2, image 75, previously 9 x 6 x 17 mm). No newly enlarged lymph nodes are identified. Vascular: Major vessels of the neck are patent. Mild-to-moderate atherosclerosis at the carotid bifurcations without a suspected flow limiting stenosis. Limited intracranial: Unremarkable. Visualized orbits: Right eyelid weight. Mastoids and visualized paranasal sinuses: Mild-to-moderate mucosal thickening  in the ethmoid sinuses. Clear mastoid air cells. Skeleton: No suspicious lesion.  Moderate cervical spondylosis. Upper chest: Emphysema.  No apical lung mass. Other: None. IMPRESSION: 1. Interval postsurgical changes in the right parotid region without evidence of locally recurrent tumor. 2. Stable to decreased size of small right level II and III lymph nodes. No evidence of progressive lymphadenopathy. Electronically Signed   By: Aundra Lee M.D.   On: 11/07/2023 11:31   DG Chest Portable 1 View Result Date: 10/28/2023 CLINICAL DATA:  Dyspnea EXAM: PORTABLE CHEST 1 VIEW COMPARISON:  10/18/2023 FINDINGS: Lungs are symmetrically well expanded. Diffuse interstitial thickening is again seen, likely related to underlying interstitial lung disease. No pneumothorax or pleural effusion. Cardiac size vascularity is.  No acute bone. IMPRESSION: 1. No acute cardiopulmonary process. 2. Stable diffuse interstitial thickening. Electronically Signed   By: Worthy Heads M.D.   On: 10/28/2023 22:43   DG ABD ACUTE 2+V W 1V CHEST Result Date: 10/18/2023 CLINICAL DATA:  pain EXAM: DG ABDOMEN ACUTE WITH 1 VIEW CHEST COMPARISON:  CT chest 09/15/2023 FINDINGS: The heart and mediastinal contours are within normal limits. Atherosclerotic plaque. Biapical pleural/pulmonary scarring. No focal consolidation. Chronic coarsened interstitial markings with no overt pulmonary edema. No pleural effusion. No pneumothorax. There is no evidence of dilated bowel loops or free intraperitoneal air. No radiopaque calculi or other significant radiographic abnormality is seen. Levoscoliosis of the lumbar spine. Severe degenerative changes of the L2-L3 level. IMPRESSION: 1. No acute cardiopulmonary disease. 2. Nonobstructive bowel gas pattern. 3. Aortic Atherosclerosis (ICD10-I70.0) and Emphysema (ICD10-J43.9). Electronically Signed   By: Morgane  Naveau M.D.   On: 10/18/2023 21:33    Impression/Plan:  Stage II (cT2, N0, M0) squamous cell carcinoma of the skin of the face; s/p MOH's excision and adjuvant radiation completed on 09/07/2023  1) She is in remission per imaging and exam. I will refer to H+[facility] cancer survivorship w. Alwin Baars. Patient would like to see her next available. She is in remission from skin cancer (s/p surgery/radiation to preauricular area and right neck) but struggling with emotional and physical issues, some not related to her cancer necessarily. She wants to maximize QOL. Her PCP is also very involved.  2) Nutritional Status: decreased appetite due to taste changes - counseled patient on what to expect post-radiation. Taste should improve through the next several months. Nutritionist seeing her today  Wt Readings from Last 3 Encounters:  11/11/23 133 lb 6.4 oz (60.5 kg)  11/02/23 135 lb (61.2 kg)  10/28/23 136 lb (61.7  kg)    3) Risk Factors: The patient has been educated about risk factors including alcohol and tobacco abuse; they understand that avoidance of alcohol and tobacco is important to prevent recurrences as well as other cancers. She continues to smoke   4) Swallowing: Functional, able to swallow wide variety of foods.   5) Follow/up next available at [facility] with H&N survivorship. The patient was encouraged to call with any issues or questions before then.  On date of service, in total, I spent 30 minutes on this encounter. Patient was seen in person. -----------------------------------  Colie Dawes, MD

## 2023-11-12 ENCOUNTER — Ambulatory Visit (INDEPENDENT_AMBULATORY_CARE_PROVIDER_SITE_OTHER): Admitting: Family Medicine

## 2023-11-12 DIAGNOSIS — E876 Hypokalemia: Secondary | ICD-10-CM | POA: Diagnosis not present

## 2023-11-12 DIAGNOSIS — J449 Chronic obstructive pulmonary disease, unspecified: Secondary | ICD-10-CM

## 2023-11-12 DIAGNOSIS — F419 Anxiety disorder, unspecified: Secondary | ICD-10-CM

## 2023-11-12 DIAGNOSIS — G4700 Insomnia, unspecified: Secondary | ICD-10-CM | POA: Diagnosis not present

## 2023-11-12 MED ORDER — BREZTRI AEROSPHERE 160-9-4.8 MCG/ACT IN AERO: 2.0000 | INHALATION_SPRAY | Freq: Two times a day (BID) | RESPIRATORY_TRACT | 11 refills | Status: AC

## 2023-11-12 NOTE — Progress Notes (Signed)
 Subjective:  Patient ID: Dawn Meyer, female    DOB: 1955-12-20  Age: 68 y.o. MRN: [id number]  CC:  Follow up    HPI:  68 year old female presents for follow up.  Multiple issues/concerns today.  Sleep Having difficulty staying asleep. Wakes early (around 530). When she wakes she is very nervous and anxious.  She would like to discuss use of Melatonin today.  SOB/COPD Worsening. Still smoking. Remains on Trelegy. Needs smoking cessation.  Will discuss pulmonology referral.  Hypokalemia Improved.  Needs repeat labs in the near future.  Anxiety Still quite problematic.  Using clonazepam .  Patient Active Problem List   Diagnosis Date Noted   Insomnia 11/12/2023   Weight loss 11/02/2023   GERD (gastroesophageal reflux disease) 11/02/2023   Hypokalemia 10/22/2023   Coronary artery disease involving native coronary artery of native heart without angina pectoris 09/18/2023   Anxiety 09/18/2023   SOB (shortness of breath) 09/18/2023   Squamous cell carcinoma of skin of face 07/22/2023   Acquired facial deformity 06/24/2023   Hyperlipidemia 05/14/2023   COPD (chronic obstructive pulmonary disease) (HCC) 07/02/2022   Essential hypertension 10/10/2021   Allergic rhinitis 11/17/2017   Tobacco abuse 11/17/2017   Rheumatoid arthritis involving multiple sites with positive rheumatoid factor (HCC) 08/18/2016   OP (osteoporosis) 08/18/2016    Social Hx   Social History   Socioeconomic History   Marital status: Married    Spouse name: Not on file   Number of children: Not on file   Years of education: Not on file   Highest education level: Not on file  Occupational History   Not on file  Tobacco Use   Smoking status: Every Day    Current packs/day: 0.15    Average packs/day: 0.2 packs/day for 40.0 years (6.0 ttl pk-yrs)    Types: Cigarettes    Passive exposure: Never   Smokeless tobacco: Never  Vaping Use   Vaping status: Never Used  Substance and Sexual  Activity   Alcohol use: No   Drug use: No   Sexual activity: Not Currently    Birth control/protection: Post-menopausal  Other Topics Concern   Not on file  Social History Narrative   Not on file   Social Drivers of Health   Financial Resource Strain: Not on file  Food Insecurity: No Food Insecurity (07/22/2023)   Hunger Vital Sign    Worried About Running Out of Food in the Last Year: Never true    Ran Out of Food in the Last Year: Never true  Transportation Needs: No Transportation Needs (07/22/2023)   PRAPARE - Transportation    Lack of Transportation (Medical): No    Lack of Transportation (Non-Medical): No  Physical Activity: Not on file  Stress: Not on file  Social Connections: Not on file    Review of Systems Per HPI  Objective:  BP 135/72   Pulse 71   Temp 98.8 F (37.1 C)   Ht 5' 5 (1.651 m)   Wt 133 lb 12.8 oz (60.7 kg)   SpO2 100%   BMI 22.27 kg/m      11/12/2023    9:25 AM 11/11/2023    3:25 PM 11/02/2023    3:58 PM  BP/Weight  Systolic BP 135 160 162  Diastolic BP 72 84 84  Wt. (Lbs) 133.8 133.4   BMI 22.27 kg/m2 22.2 kg/m2     Physical Exam Vitals and nursing note reviewed.  Constitutional:      General: She is  not in acute distress. HENT:     Head: Normocephalic and atraumatic.   Eyes:     General:        Right eye: No discharge.     Conjunctiva/sclera: Conjunctivae normal.    Cardiovascular:     Rate and Rhythm: Normal rate and regular rhythm.  Pulmonary:     Effort: Pulmonary effort is normal.     Breath sounds: No wheezing or rales.   Neurological:     Mental Status: She is alert.     Lab Results  Component Value Date   WBC 8.6 10/28/2023   HGB 13.5 10/28/2023   HCT 39.6 10/28/2023   PLT 206 10/28/2023   GLUCOSE 116 (H) 11/09/2023   CHOL 172 01/08/2021   TRIG 122 01/08/2021   HDL 47 01/08/2021   LDLCALC 103 (H) 01/08/2021   ALT 10 10/18/2023   AST 13 (L) 10/18/2023   NA 137 11/09/2023   K 4.3 11/09/2023   CL 104  11/09/2023   CREATININE 0.80 11/09/2023   BUN 15 11/09/2023   CO2 20 11/09/2023   TSH 0.702 09/12/2017     Assessment & Plan:  Hypokalemia Assessment & Plan: Repeat BMP ordered.  Advised to get labs drawn in 3 to 4 weeks.  Continue potassium.  Orders: -     Basic metabolic panel with GFR  Chronic obstructive pulmonary disease, unspecified COPD type (HCC) Assessment & Plan: Will see if we can get Breztri covered.  Referring to pulmonology.  Patient states that she is going to start using nicotine patches.  Needs to quit smoking.  Orders: -     Breztri Aerosphere; Inhale 2 puffs into the lungs 2 (two) times daily.  Dispense: 10.7 g; Refill: 11  Anxiety Assessment & Plan: Continue as needed Klonopin .   Insomnia, unspecified type Assessment & Plan: Advised that use of melatonin is perfectly reasonable.  Patient can use clonazepam  at night if she desires.  We had a lengthy discussion about this in regards to long-term use.     Follow-up:  Pending follow up metabolic panel  Kathleen Papa DO [facility]

## 2023-11-12 NOTE — Assessment & Plan Note (Signed)
 Repeat BMP ordered.  Advised to get labs drawn in 3 to 4 weeks.  Continue potassium.

## 2023-11-12 NOTE — Assessment & Plan Note (Signed)
 Advised that use of melatonin is perfectly reasonable.  Patient can use clonazepam  at night if she desires.  We had a lengthy discussion about this in regards to long-term use.

## 2023-11-12 NOTE — Assessment & Plan Note (Signed)
 Continue as needed Klonopin

## 2023-11-12 NOTE — Assessment & Plan Note (Addendum)
 Will see if we can get Breztri covered.  Referring to pulmonology.  Patient states that she is going to start using nicotine patches.  Needs to quit smoking.

## 2023-11-12 NOTE — Patient Instructions (Signed)
 Breztri sent in.  Work on smoking cessation.  Lab in ~ 3 weeks.  Take care  Dr. Debrah Fan

## 2023-11-13 ENCOUNTER — Other Ambulatory Visit: Payer: Self-pay

## 2023-11-13 DIAGNOSIS — C44320 Squamous cell carcinoma of skin of unspecified parts of face: Secondary | ICD-10-CM

## 2023-11-19 ENCOUNTER — Encounter: Payer: Self-pay | Admitting: Family Medicine

## 2023-11-19 ENCOUNTER — Other Ambulatory Visit: Payer: Self-pay | Admitting: Family Medicine

## 2023-11-19 DIAGNOSIS — L570 Actinic keratosis: Secondary | ICD-10-CM | POA: Diagnosis not present

## 2023-11-19 DIAGNOSIS — X32XXXD Exposure to sunlight, subsequent encounter: Secondary | ICD-10-CM | POA: Diagnosis not present

## 2023-11-19 DIAGNOSIS — J449 Chronic obstructive pulmonary disease, unspecified: Secondary | ICD-10-CM

## 2023-11-25 ENCOUNTER — Encounter: Payer: Self-pay | Admitting: Internal Medicine

## 2023-11-25 ENCOUNTER — Ambulatory Visit: Admitting: Internal Medicine

## 2023-11-25 VITALS — BP 155/78 | HR 75 | Ht 65.0 in | Wt 131.6 lb

## 2023-11-25 DIAGNOSIS — F17210 Nicotine dependence, cigarettes, uncomplicated: Secondary | ICD-10-CM

## 2023-11-25 DIAGNOSIS — J449 Chronic obstructive pulmonary disease, unspecified: Secondary | ICD-10-CM

## 2023-11-25 DIAGNOSIS — C44519 Basal cell carcinoma of skin of other part of trunk: Secondary | ICD-10-CM | POA: Diagnosis not present

## 2023-11-25 NOTE — Progress Notes (Unsigned)
 Dawn Meyer, female    DOB: 1955-08-16    MRN: [id number]   Brief patient profile:  16   yowf  active smoker some allergies growing up but no need for inhalers  referred to pulmonary clinic in [place]  11/25/2023 by Dr Bluford for copd eval with onset of doe x 2015 on prn alb but changed to breztri  June 2025 from trelegy with some improvement.  Pt not previously seen by PCCM service.    History of Present Illness  11/25/2023  Pulmonary/ 1st office eval/ Evelyn Aguinaldo / [place] [facility]  Chief Complaint  Patient presents with   Establish Care  Dyspnea:  walking indoors due to heat /does walking  at [employer] slower pace than others = MMRC2 = can't walk a nl pace on a flat grade s sob but does fine slow and flat  Cough: no excess production even in am  Sleep: flat / 2 pillows  SABA use: avg 2-3 hfa / nad neb q am  02: none     No obvious day to day or daytime pattern/variability or assoc excess/ purulent sputum or mucus plugs or hemoptysis or cp or chest tightness, subjective wheeze or overt sinus or hb symptoms.    Also denies any obvious fluctuation of symptoms with weather or environmental changes or other aggravating or alleviating factors except as outlined above   No unusual exposure hx or h/o childhood pna/ asthma or knowledge of premature birth.  Current Allergies, Complete Past Medical History, Past Surgical History, Family History, and Social History were reviewed in [employer] record.  ROS  The following are not active complaints unless bolded Hoarseness, sore throat, dysphagia, dental problems, itching, sneezing,  nasal congestion or discharge of excess mucus or purulent secretions, ear ache,   fever, chills, sweats, unintended wt loss or wt gain, classically pleuritic or exertional cp,  orthopnea pnd or arm/hand swelling  or leg swelling, presyncope, palpitations, abdominal pain, anorexia, nausea, vomiting, diarrhea  or change in bowel habits or change in  bladder habits, change in stools or change in urine, dysuria, hematuria,  rash, arthralgias, visual complaints, headache, numbness, weakness or ataxia or problems with walking or coordination,  change in mood or  memory.            Outpatient Medications Prior to Visit  Medication Sig Dispense Refill   Acetaminophen  (TYLENOL  ARTHRITIS PAIN PO) Take by mouth as needed.     albuterol  (PROVENTIL ) (2.5 MG/3ML) 0.083% nebulizer solution Take 3 mLs (2.5 mg total) by nebulization every 6 (six) hours as needed for wheezing or shortness of breath. 150 mL 1   albuterol  (VENTOLIN  HFA) 108 (90 Base) MCG/ACT inhaler INHALE TWO PUFFS INTO THE LUNGS EVERY SIX HOURS AS NEEDED FOR WHEEZING OR SHORTNESS OF BREATH 18 g 3   amLODipine  (NORVASC ) 10 MG tablet Take 1 tablet (10 mg total) by mouth daily. 90 tablet 3   budesonide -glycopyrrolate-formoterol (BREZTRI  AEROSPHERE) 160-9-4.8 MCG/ACT AERO inhaler Inhale 2 puffs into the lungs 2 (two) times daily. 10.7 g 11   clonazePAM  (KLONOPIN ) 0.5 MG tablet Take 1 tablet (0.5 mg total) by mouth 2 (two) times daily as needed for anxiety. 20 tablet 1   Fluticasone -Umeclidin-Vilant (TRELEGY ELLIPTA ) 100-62.5-25 MCG/ACT AEPB Inhale 1 puff into the lungs daily. 1 each 11   melatonin 3 MG TABS tablet Take 3 mg by mouth at bedtime.     pantoprazole  (PROTONIX ) 40 MG tablet Take 1 tablet (40 mg total) by mouth daily. 90 tablet 1  potassium chloride  SA (KLOR-CON  M) 20 MEQ tablet Take 1 tablet (20 mEq total) by mouth daily. 30 tablet 0   tobramycin-dexamethasone  (TOBRADEX) ophthalmic solution Place 4 drops into the right ear 2 (two) times daily as needed (ear drainage).     No facility-administered medications prior to visit.    Past Medical History:  Diagnosis Date   Asthma    COPD (chronic obstructive pulmonary disease) (HCC)    GERD (gastroesophageal reflux disease)    Hypertension    Prolapsed uterus    per patient, dx by OB-GYN   RA (rheumatoid arthritis) (HCC)     Smoker    1/4/ppd   squamous cell of the rt face 10/2022      Objective:     BP (!) 155/78   Pulse 75   Ht 5' 5 (1.651 m)   Wt 131 lb 9.6 oz (59.7 kg)   SpO2 100% Comment: ra  BMI 21.90 kg/m   SpO2: 100 % (ra)  Amb somber wf nad    HEENT : Oropharynx  clear   Nasal turbinates nl   NECK :  without  apparent JVD/ palpable Nodes/TM    LUNGS: no acc muscle use,  Min barrel  contour chest wall with bilateral  slightly decreased bs s audible wheeze and  without cough on insp or exp maneuvers and min  Hyperresonant  to  percussion bilaterally    CV:  RRR  no s3 or murmur or increase in P2, and no edema   ABD:  soft and nontender with pos end  insp Hoover's  in the supine position.  No bruits or organomegaly appreciated   MS:  Nl gait/ ext warm without deformities Or obvious joint restrictions  calf tenderness, cyanosis or clubbing     SKIN: warm and dry without lesions    NEURO:  alert, approp, nl sensorium with  no motor or cerebellar deficits apparent.           Cta 09/15/23 1. No filling defect is identified in the pulmonary arterial tree to suggest pulmonary embolus. 2.  Emphysema (ICD10-J43.9). 3. Chronic T7 superior endplate compression fracture. 4. Coronary, aortic arch, and branch vessel atherosclerotic vascular disease. Aortic Atherosclerosis (ICD10-I70.0). Assessment   GOLD ? COPD Active smoker - 11/25/2023  After extensive coaching inhaler device,  effectiveness =    75% from a baseline of 50%   - 11/25/2023   Walked on RA  x  3  lap(s) =  approx 450  ft  @ mod pace, stopped due end of study with lowest 02 sats 97% s sob   - PFT's ordered 11/25/2023 >>>   Clinically she is Group B in terms of symptom/risk and laba/lama therefore appropriate rx at this point >>>  continue breztri  pending pfts          Each maintenance medication was reviewed in detail including emphasizing most importantly the difference between maintenance and prns and under what  circumstances the prns are to be triggered using an action plan format where appropriate.  Total time for H and P, chart review, counseling, reviewing hfa  device(s) and generating customized AVS unique to this office visit / same day charting = 35 min new pt eval            Cigarette smoker 4-5 min discussion re active cigarette smoking in addition to office E&M  Ask about tobacco use:   ongoing  Advise quitting     I reviewed the Fletcher curve with  the patient that basically indicates that  if you quit smoking when your best day FEV1 is still well preserved (as is clearly  the case here)  it is highly unlikely you will progress to severe disease and informed the patient there was  no medication on the market that has proven to alter the curve/ its downward trajectory  or the likelihood of progression of their disease(unlike other chronic medical conditions such as atheroclerosis where we do think we can change the natural hx with risk reducing meds)    Therefore stopping smoking and maintaining abstinence are  the most important aspects of her  care, not choice of inhalers or for that matter, Pulmonary doctors.  Assess willingness:  Not committed at this point Assist in quit attempt:  Per PCP when ready Arrange follow up:   Follow up per Primary Care planned         Ozell America, MD 11/25/2023

## 2023-11-25 NOTE — Patient Instructions (Signed)
 Plan A = Automatic = Always=    Breztri  1-2 puffs 1st thing in AM  and repeat 12 hours   Work on inhaler technique:  relax and gently blow all the way out then take a nice smooth full deep breath back in, triggering the inhaler at same time you start breathing in.  Hold breath in for at least  5 seconds if you can. Blow out breztri   thru nose. Rinse and gargle with water when done.  If mouth or throat bother you at all,  try brushing teeth/gums/tongue with arm and hammer toothpaste/ make a slurry and gargle and spit out.     Plan B = Backup (to supplement plan A, not to replace it) Only use your albuterol  inhaler as a rescue medication to be used if you can't catch your breath by resting or doing a relaxed purse lip breathing pattern.  - The less you use it, the better it will work when you need it. - Ok to use the inhaler up to 2 puffs  every 4 hours if you must but call for appointment if use goes up over your usual need - Don't leave home without it !!  (think of it like the spare tire for your car)   Plan C = Crisis (instead of Plan B but only if Plan B stops working) - only use your albuterol  nebulizer if you first try Plan B and it fails to help > ok to use the nebulizer up to every 4 hours but if start needing it regularly call for immediate appointment    PFTs next available   Please schedule a follow up visit in 3 months but call sooner if needed - bring inhalers with you

## 2023-11-26 NOTE — Assessment & Plan Note (Addendum)
 Active smoker - 11/25/2023  After extensive coaching inhaler device,  effectiveness =    75% from a baseline of 50%   - 11/25/2023   Walked on RA  x  3  lap(s) =  approx 450  ft  @ mod pace, stopped due end of study with lowest 02 sats 97% s sob   - PFT's ordered 11/25/2023 >>>   Clinically she is Group B in terms of symptom/risk and laba/lama therefore appropriate rx at this point >>>  continue breztri  pending pfts          Each maintenance medication was reviewed in detail including emphasizing most importantly the difference between maintenance and prns and under what circumstances the prns are to be triggered using an action plan format where appropriate.  Total time for H and P, chart review, counseling, reviewing hfa  device(s) and generating customized AVS unique to this office visit / same day charting = 35 min new pt eval

## 2023-11-26 NOTE — Assessment & Plan Note (Signed)
 4-5 min discussion re active cigarette smoking in addition to office E&M  Ask about tobacco use:   ongoing  Advise quitting     I reviewed the Fletcher curve with the patient that basically indicates that  if you quit smoking when your best day FEV1 is still well preserved (as is clearly  the case here)  it is highly unlikely you will progress to severe disease and informed the patient there was  no medication on the market that has proven to alter the curve/ its downward trajectory  or the likelihood of progression of their disease(unlike other chronic medical conditions such as atheroclerosis where we do think we can change the natural hx with risk reducing meds)    Therefore stopping smoking and maintaining abstinence are  the most important aspects of her  care, not choice of inhalers or for that matter, Pulmonary doctors.  Assess willingness:  Not committed at this point Assist in quit attempt:  Per PCP when ready Arrange follow up:   Follow up per Primary Care planned

## 2023-11-28 ENCOUNTER — Other Ambulatory Visit: Payer: Self-pay | Admitting: Family Medicine

## 2023-11-28 DIAGNOSIS — Z720 Tobacco use: Secondary | ICD-10-CM

## 2023-11-30 DIAGNOSIS — E876 Hypokalemia: Secondary | ICD-10-CM | POA: Diagnosis not present

## 2023-12-01 ENCOUNTER — Telehealth: Payer: Self-pay | Admitting: Adult Health

## 2023-12-01 ENCOUNTER — Other Ambulatory Visit: Payer: Self-pay

## 2023-12-01 ENCOUNTER — Ambulatory Visit: Payer: Self-pay | Admitting: Family Medicine

## 2023-12-01 ENCOUNTER — Other Ambulatory Visit: Payer: Self-pay | Admitting: Family Medicine

## 2023-12-01 DIAGNOSIS — E876 Hypokalemia: Secondary | ICD-10-CM

## 2023-12-01 DIAGNOSIS — I10 Essential (primary) hypertension: Secondary | ICD-10-CM

## 2023-12-01 LAB — BASIC METABOLIC PANEL WITH GFR
BUN/Creatinine Ratio: 17 (ref 12–28)
BUN: 14 mg/dL (ref 8–27)
CO2: 20 mmol/L (ref 20–29)
Calcium: 9.3 mg/dL (ref 8.7–10.3)
Chloride: 101 mmol/L (ref 96–106)
Creatinine, Ser: 0.84 mg/dL (ref 0.57–1.00)
Glucose: 115 mg/dL — ABNORMAL HIGH (ref 70–99)
Potassium: 4.3 mmol/L (ref 3.5–5.2)
Sodium: 137 mmol/L (ref 134–144)
eGFR: 77 mL/min/1.73 (ref >59)

## 2023-12-01 MED ORDER — POTASSIUM CHLORIDE CRYS ER 20 MEQ PO TBCR
20.0000 meq | EXTENDED_RELEASE_TABLET | Freq: Every day | ORAL | 0 refills | Status: DC
Start: 2023-12-01 — End: 2024-01-04

## 2023-12-01 NOTE — Telephone Encounter (Signed)
 Scheduled appointment per 7/7 secure chat. Talked with the patient and she is aware of the made appointment.

## 2023-12-02 ENCOUNTER — Other Ambulatory Visit: Payer: Self-pay | Admitting: Family Medicine

## 2023-12-03 ENCOUNTER — Encounter: Payer: Self-pay | Admitting: Cardiology

## 2023-12-03 ENCOUNTER — Ambulatory Visit: Attending: Cardiology | Admitting: Cardiology

## 2023-12-03 VITALS — BP 144/74 | HR 87 | Ht 65.0 in | Wt 132.8 lb

## 2023-12-03 DIAGNOSIS — Z79899 Other long term (current) drug therapy: Secondary | ICD-10-CM

## 2023-12-03 DIAGNOSIS — I10 Essential (primary) hypertension: Secondary | ICD-10-CM | POA: Diagnosis not present

## 2023-12-03 DIAGNOSIS — I2510 Atherosclerotic heart disease of native coronary artery without angina pectoris: Secondary | ICD-10-CM

## 2023-12-03 DIAGNOSIS — E785 Hyperlipidemia, unspecified: Secondary | ICD-10-CM

## 2023-12-03 NOTE — Patient Instructions (Addendum)
 Medication Instructions:   Continue all current medications.   Labwork:  FLP - order given today Reminder:  Nothing to eat or drink after 12 midnight prior to labs.  Testing/Procedures:  Coronary calcium scoring   Follow-Up:  Office will contact with results via phone, letter or mychart.    Pending test results   Any Other Special Instructions Will Be Listed Below (If Applicable).  BP log x 1 week & return to office for provider reveiw  If you need a refill on your cardiac medications before your next appointment, please call your pharmacy.

## 2023-12-03 NOTE — Progress Notes (Signed)
 Clinical Summary Ms. Creasey is a 68 y.o.female seen today as a new consult, referred by Dr Bluford for the following medical problems.  1.Coronary atherosclerosis - ER visit 09/15/23 with SOB - EKG SR, LAFB. Trop neg x 2.  - CT PE no PE, +coronary and aortic atherosclerosis  -no chest pains. SOB she relates to COPD, often triggered by heat or humidity.  - sedentary since her cancer surgeries. Walks up 10 minutes multiples time a day without significant symptoms  CAD risk factors: HTN, smoker x 50+ years  2.Anxiety   3.COPD - followed by pulmonary  4. Squamous cell carcinoma of face - multiple recent surgeries, radiation.   5. HTN - compliant with meds   Her parents the Moricles who have beenpatients of mine, her aunt is Heron Finder   Past Medical History:  Diagnosis Date   Asthma    COPD (chronic obstructive pulmonary disease) (HCC)    GERD (gastroesophageal reflux disease)    Hypertension    Prolapsed uterus    per patient, dx by OB-GYN   RA (rheumatoid arthritis) (HCC)    Smoker    1/4/ppd   squamous cell of the rt face 10/2022     Allergies  Allergen Reactions   Doxycycline  Rash   Aquaphor [Lanolin-Petrolatum ] Itching and Rash    Redness     Current Outpatient Medications  Medication Sig Dispense Refill   Acetaminophen  (TYLENOL  ARTHRITIS PAIN PO) Take by mouth as needed.     albuterol  (PROVENTIL ) (2.5 MG/3ML) 0.083% nebulizer solution Take 3 mLs (2.5 mg total) by nebulization every 6 (six) hours as needed for wheezing or shortness of breath. 150 mL 1   albuterol  (VENTOLIN  HFA) 108 (90 Base) MCG/ACT inhaler INHALE TWO PUFFS INTO THE LUNGS EVERY SIX HOURS AS NEEDED FOR WHEEZING OR SHORTNESS OF BREATH 18 g 3   amLODipine  (NORVASC ) 10 MG tablet TAKE ONE TABLET (10MG  TOTAL) BY MOUTH DAILY 90 tablet 1   budesonide -glycopyrrolate-formoterol (BREZTRI  AEROSPHERE) 160-9-4.8 MCG/ACT AERO inhaler Inhale 2 puffs into the lungs 2 (two) times daily. 10.7 g 11    clonazePAM  (KLONOPIN ) 0.5 MG tablet Take 1 tablet (0.5 mg total) by mouth 2 (two) times daily as needed for anxiety. 20 tablet 1   melatonin 3 MG TABS tablet Take 3 mg by mouth at bedtime.     pantoprazole  (PROTONIX ) 40 MG tablet Take 1 tablet (40 mg total) by mouth daily. 90 tablet 1   potassium chloride  SA (KLOR-CON  M) 20 MEQ tablet Take 1 tablet (20 mEq total) by mouth daily. 30 tablet 0   No current facility-administered medications for this visit.     Past Surgical History:  Procedure Laterality Date   ADJACENT TISSUE TRANSFER/TISSUE REARRANGEMENT Right 06/24/2023   Procedure: ADJACENT TISSUE TRANSFER/TISSUE REARRANGEMENT;  Surgeon: Luciano Standing, MD;  Location: [facility];  Service: ENT;  Laterality: Right;   COLONOSCOPY WITH PROPOFOL  N/A 08/09/2021   Procedure: COLONOSCOPY WITH PROPOFOL ;  Surgeon: Shaaron Lamar HERO, MD;  Location[facility];  Service: Endoscopy;  Laterality: N/A;  7:30am   EXCISION MASS HEAD Right 06/24/2023   Procedure: EXCISION AND REPAIR OF RIGHT CHEEK MOHS DEFECT;  Surgeon: Luciano Standing, MD;  Location: [facility];  Service: ENT;  Laterality: Right;   EXCISION MASS HEAD Bilateral 08/03/2023   Procedure: 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; with primary closure;  Surgeon: Luciano Standing, MD;  Location: [facility];  Service: ENT;  Laterality: Bilateral;  Lower Lip   [place] procedure  04/28/2022   for prolapsed cervix   MOHS SURGERY     SQUAMOUS CELL CARCINOMA EXCISION     VAGINAL DELIVERY       Allergies  Allergen Reactions   Doxycycline  Rash   Aquaphor [Lanolin-Petrolatum ] Itching and Rash    Redness      Family History  Problem Relation Age of Onset   Congestive Heart Failure Mother    Heart attack Mother    Diabetes Father    Heart Problems Father        pacemaker   Diverticulitis Sister    Hypertension Sister    Heart attack Brother    Healthy Daughter    Colon cancer  Neg Hx      Social History Ms. Pound reports that she has been smoking cigarettes. She has a 6 pack-year smoking history. She has never been exposed to tobacco smoke. She has never used smokeless tobacco. Ms. Lasch reports no history of alcohol use.     Physical Examination Vitals:   12/03/23 1312 12/03/23 1336  BP: (!) 140/76 (!) 144/74  Pulse: 87   SpO2: 99%    Filed Weights   12/03/23 1312  Weight: 132 lb 12.8 oz (60.2 kg)    Gen: resting comfortably, no acute distress HEENT: no scleral icterus, pupils equal round and reactive, no palptable cervical adenopathy,  CV: RRR, no mrg, no jvd Resp: Clear to auscultation bilaterally GI: abdomen is soft, non-tender, non-distended, normal bowel sounds, no hepatosplenomegaly MSK: extremities are warm, no edema.  Skin: warm, no rash Neuro:  no focal deficits Psych: appropriate affect     Assessment and Plan  1.Coronary atherosclerosis - noted by coronary CTA - denies any chest pains. Denies DOE, can have SOB intermittently often brought on by hot temperatures or humidity, following with pulmonary for COPD -in absence of clear cardiac symptoms would not pursue ischemic testing. Would plan for coronary calcium score to better risk stratify her atherosclerotic burden and guide level of medical therapy - update lipid panel   2. HTN - elevated here, bp log x 1 week. If above goal likely add ARB.   F/u pending test results    Dorn PHEBE Ross, M.D.,

## 2023-12-07 DIAGNOSIS — Z1231 Encounter for screening mammogram for malignant neoplasm of breast: Secondary | ICD-10-CM | POA: Diagnosis not present

## 2023-12-08 ENCOUNTER — Ambulatory Visit: Admitting: Internal Medicine

## 2023-12-08 ENCOUNTER — Ambulatory Visit: Admitting: Podiatry

## 2023-12-10 ENCOUNTER — Ambulatory Visit (HOSPITAL_COMMUNITY)

## 2023-12-11 DIAGNOSIS — M952 Other acquired deformity of head: Secondary | ICD-10-CM | POA: Diagnosis not present

## 2023-12-11 DIAGNOSIS — T66XXXD Radiation sickness, unspecified, subsequent encounter: Secondary | ICD-10-CM | POA: Diagnosis not present

## 2023-12-11 DIAGNOSIS — F4320 Adjustment disorder, unspecified: Secondary | ICD-10-CM | POA: Diagnosis not present

## 2023-12-11 DIAGNOSIS — L570 Actinic keratosis: Secondary | ICD-10-CM | POA: Diagnosis not present

## 2023-12-11 DIAGNOSIS — H029 Unspecified disorder of eyelid: Secondary | ICD-10-CM | POA: Diagnosis not present

## 2023-12-11 DIAGNOSIS — G510 Bell's palsy: Secondary | ICD-10-CM | POA: Diagnosis not present

## 2023-12-11 NOTE — Progress Notes (Signed)
 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 She was last seen on 11/09/23.    The patient presents today for a follow-up visit.  She reports feeling unwell, experiencing stress and sleep disturbances. She has an upcoming appointment at the cancer center in a few weeks. She has been chewing her bottom lip at night, for which her dentist provided a guard to protect her front two teeth.  She has not experienced any issues with her arthritis until yesterday when it flared up severely. She had an appointment scheduled for 01/07/2024 but managed to secure an earlier slot on Monday. She is using a glove for compression but feels it is not providing enough pressure. She also mentions that her ankles are affected.  She believes her face is stable and has not noticed any new lumps or bumps in her neck. She reports no ear drainage. She has a skin issue near her eye that typically resolves with washing, but she has refrained from doing so to allow examination. She is considering having it removed by a dermatologist.  Physical Exam Face and neck without any new lymphadenopathy. Right upper eyelid with keratotic lesion ~95mm at eyelid margin. Eyelid weight 3mm above lash margin. Lower eyelid with mild residual ectropion.   Results   Assessment & Plan 1. Lesion of right upper eyelid   2. 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 Radiation therapy complication, subsequent encounter   5. 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 NED. Reassurance provided.   Discussed considering therapy for adjustment disorder, depressed mood; consider discussion with PCP.   Eyelid lesion. A lesion on the eyelid was noted, which was requested to be removed due to irritation. An eyelid biopsy will be performed today. FU Path.   FU in 3 months  I have personally spent 30 minutes involved in face-to-face and non-face-to-face activities for this patient on the day of the visit.  Professional time spent includes the following activities, in addition to those noted in the documentation: preparing to see the patient (eg, review of tests), obtaining and/or reviewing separately obtained history, performing a medically appropriate examination and/or evaluation, counseling and educating the patient/family/caregiver, ordering medications, tests or procedures, referring and communicating with other healthcare professionals, documenting clinical information in the electronic or other health record, independently interpreting results and communicating results with the patient/family/caregiver, care coordination.   Electronically signed by:  Elspeth Coddington, MD  Staff Physician Facial Plastic & Reconstructive Surgery Otolaryngology - Head and Neck Surgery [facility] [place]

## 2023-12-11 NOTE — Progress Notes (Signed)
  Skin Biopsy, Ea SEP/ADD Lesion  Date/Time: 12/11/2023 11:30 AM  Performed by: Elspeth Cordella Coddington, MD Authorized by: Elspeth Cordella Coddington, MD  Preparation: Patient was prepped and draped in the usual sterile fashion. Local anesthesia used: yes  Anesthesia: Local anesthesia used: yes Local Anesthetic: lidocaine  1% with epinephrine  and NaHCO3 (sodium bicarbonate) Anesthetic total: 0.5 mL  Sedation: Patient sedated: no  Comments: After verbal consent was obtained the eye was topically anesthetized with 0.5% tetracaine eyedrops and local anesthetic.  The eye was prepped with Betadine .  Next the eyelid lesion approximately 3 mm was grasped with brown forceps and a 15 blade was used to perform shave excisional biopsy at the level of the eyelashes.  Hemostasis was achieved with pressure.  Specimen fixed in formalin and sent for permanent pathology.    Electronically signed by:  Elspeth Coddington, MD  Staff Physician Facial Plastic & Reconstructive Surgery Otolaryngology - Head and Neck Surgery [facility] [place]

## 2023-12-12 ENCOUNTER — Other Ambulatory Visit: Payer: Self-pay

## 2023-12-12 ENCOUNTER — Emergency Department (HOSPITAL_COMMUNITY)

## 2023-12-12 ENCOUNTER — Encounter (HOSPITAL_COMMUNITY): Payer: Self-pay | Admitting: Emergency Medicine

## 2023-12-12 ENCOUNTER — Emergency Department (HOSPITAL_COMMUNITY)
Admission: EM | Admit: 2023-12-12 | Discharge: 2023-12-12 | Disposition: A | Discharge status: Home / Self Care | Attending: Emergency Medicine | Admitting: Emergency Medicine

## 2023-12-12 DIAGNOSIS — R221 Localized swelling, mass and lump, neck: Secondary | ICD-10-CM | POA: Diagnosis not present

## 2023-12-12 DIAGNOSIS — M2550 Pain in unspecified joint: Secondary | ICD-10-CM | POA: Diagnosis not present

## 2023-12-12 DIAGNOSIS — J029 Acute pharyngitis, unspecified: Secondary | ICD-10-CM | POA: Insufficient documentation

## 2023-12-12 DIAGNOSIS — E042 Nontoxic multinodular goiter: Secondary | ICD-10-CM | POA: Diagnosis not present

## 2023-12-12 DIAGNOSIS — M542 Cervicalgia: Secondary | ICD-10-CM | POA: Diagnosis not present

## 2023-12-12 LAB — BASIC METABOLIC PANEL WITH GFR
Anion gap: 9 (ref 5–15)
BUN: 16 mg/dL (ref 8–23)
CO2: 23 mmol/L (ref 22–32)
Calcium: 8.8 mg/dL — ABNORMAL LOW (ref 8.9–10.3)
Chloride: 102 mmol/L (ref 98–111)
Creatinine, Ser: 0.72 mg/dL (ref 0.44–1.00)
GFR, Estimated: >60 mL/min (ref >60)
Glucose, Bld: 126 mg/dL — ABNORMAL HIGH (ref 70–99)
Potassium: 3.7 mmol/L (ref 3.5–5.1)
Sodium: 134 mmol/L — ABNORMAL LOW (ref 135–145)

## 2023-12-12 LAB — CBC WITH DIFFERENTIAL/PLATELET
Abs Immature Granulocytes: 0.04 K/uL (ref 0.00–0.07)
Basophils Absolute: 0 K/uL (ref 0.0–0.1)
Basophils Relative: 0 %
Eosinophils Absolute: 0.1 K/uL (ref 0.0–0.5)
Eosinophils Relative: 1 %
HCT: 35.6 % — ABNORMAL LOW (ref 36.0–46.0)
Hemoglobin: 11.4 g/dL — ABNORMAL LOW (ref 12.0–15.0)
Immature Granulocytes: 0 %
Lymphocytes Relative: 7 %
Lymphs Abs: 0.8 K/uL (ref 0.7–4.0)
MCH: 31.5 pg (ref 26.0–34.0)
MCHC: 32 g/dL (ref 30.0–36.0)
MCV: 98.3 fL (ref 80.0–100.0)
Monocytes Absolute: 0.9 K/uL (ref 0.1–1.0)
Monocytes Relative: 8 %
Neutro Abs: 9.5 K/uL — ABNORMAL HIGH (ref 1.7–7.7)
Neutrophils Relative %: 84 %
Platelets: 155 K/uL (ref 150–400)
RBC: 3.62 MIL/uL — ABNORMAL LOW (ref 3.87–5.11)
RDW: 13.1 % (ref 11.5–15.5)
WBC: 11.4 K/uL — ABNORMAL HIGH (ref 4.0–10.5)
nRBC: 0 % (ref 0.0–0.2)

## 2023-12-12 MED ORDER — IOHEXOL 300 MG/ML  SOLN
75.0000 mL | Freq: Once | INTRAMUSCULAR | Status: AC | PRN
  Administered 2023-12-12: 75 mL via INTRAVENOUS

## 2023-12-12 MED ORDER — ACETAMINOPHEN 500 MG PO TABS
1000.0000 mg | ORAL_TABLET | Freq: Once | ORAL | Status: AC
  Administered 2023-12-12: 1000 mg via ORAL
  Filled 2023-12-12: qty 2

## 2023-12-12 NOTE — ED Provider Notes (Signed)
 [facility] EMERGENCY DEPARTMENT AT [facility] Provider Note   CSN: [id number] Arrival date & time: 12/12/23  1240     Patient presents with: Sore Throat and Facial Swelling   Dawn Meyer is a 68 y.o. female.  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  Patient has had some soreness in her neck.  No difficulty breathing or swallowing.  No fevers.  No overlying erythema or warmth.  She feels that her saliva has been more thick.  Patient has noted a flare in her joints over the past several days, treated at home with arthritis strength Tylenol .  No voice change.  No trismus.       Prior to Admission medications   Medication Sig Start Date End Date Taking? Authorizing Provider  Acetaminophen  (TYLENOL  ARTHRITIS PAIN PO) Take by mouth as needed.    [provider]  albuterol  (PROVENTIL ) (2.5 MG/3ML) 0.083% nebulizer solution Take 3 mLs (2.5 mg total) by nebulization every 6 (six) hours as needed for wheezing or shortness of breath. 11/02/23   Cook, Jayce G, DO  albuterol  (VENTOLIN  HFA) 108 (90 Base) MCG/ACT inhaler INHALE TWO PUFFS INTO THE LUNGS EVERY SIX HOURS AS NEEDED FOR WHEEZING OR SHORTNESS OF BREATH 11/30/23   Cook, Jayce G, DO  amLODipine  (NORVASC ) 10 MG tablet TAKE ONE TABLET (10MG  TOTAL) BY MOUTH DAILY 12/03/23   Cook, Jayce G, DO  budesonide -glycopyrrolate-formoterol (BREZTRI  AEROSPHERE) 160-9-4.8 MCG/ACT AERO inhaler Inhale 2 puffs into the lungs 2 (two) times daily.  11/12/23   Cook, Jayce G, DO  clonazePAM  (KLONOPIN ) 0.5 MG tablet Take 1 tablet (0.5 mg total) by mouth 2 (two) times daily as needed for anxiety. 11/02/23   Cook, Jayce G, DO  melatonin 3 MG TABS tablet Take 3 mg by mouth at bedtime.    [provider]  pantoprazole  (PROTONIX ) 40 MG tablet Take 1 tablet (40 mg total) by mouth daily. 11/02/23   Cook, Jayce G, DO  potassium chloride  SA (KLOR-CON  M) 20 MEQ tablet Take 1 tablet (20 mEq total) by mouth daily. 12/01/23   Cook, Jayce G, DO    Allergies: Doxycycline  and Aquaphor [lanolin-petrolatum ]    Review of Systems  Updated Vital Signs BP 124/84   Pulse 95   Temp 98.3 F (36.8 C) (Oral)   Resp 18   SpO2 98%   Physical Exam Vitals and nursing note reviewed.  Constitutional:      Appearance: She is well-developed.  HENT:     Head: Normocephalic and atraumatic.     Ears:     Comments: Right sided anterior neck swelling, mostly soft, tonsillar lymph node palpable.  No redness or warmth.  No obvious fluctuance or induration.  No voice change.  No trismus.    Nose: No congestion or rhinorrhea.     Mouth/Throat:     Pharynx: No pharyngeal swelling or posterior oropharyngeal erythema.  Eyes:     Conjunctiva/sclera: Conjunctivae normal.  Pulmonary:     Effort: No respiratory distress.  Musculoskeletal:     Cervical back: Normal range of motion and neck supple.  Skin:  General: Skin is warm and dry.  Neurological:     Mental Status: She is alert.     (all labs ordered are listed, but only abnormal results are displayed) Labs Reviewed  CBC WITH DIFFERENTIAL/PLATELET  BASIC METABOLIC PANEL WITH GFR    EKG: None  Radiology: No results found.  {Document cardiac monitor, telemetry assessment procedure when appropriate:32947} Procedures   Medications Ordered in the ED - No data to display  ED Course  Patient seen and examined. History obtained directly from patient. Reviewed recent ENT note (yesterday) and CT neck from  June showing some lymphadenopathy.   Labs/EKG: Ordered CBC and BMP.  Imaging: Ordered CT soft tissue neck with contrast.  Medications/Fluids: None ordered  Most recent vital signs reviewed and are as follows: BP 124/84   Pulse 95   Temp 98.3 F (36.8 C) (Oral)   Resp 18   SpO2 98%   Initial impression: Anterior neck swelling and patient with complicated past medical history.  Unclear underlying etiology.  Will evaluate for worsening lymphadenopathy, abscess, signs of SVC syndrome.     {Click here for ABCD2, HEART and other calculators REFRESH Note before signing:1}                              Medical Decision Making Amount and/or Complexity of Data Reviewed Labs: ordered. Radiology: ordered.   ***  {Document critical care time when appropriate  Document review of labs and clinical decision tools ie CHADS2VASC2, etc  Document your independent review of radiology images and any outside records  Document your discussion with family members, caretakers and with consultants  Document social determinants of health affecting pt's care  Document your decision making why or why not admission, treatments were needed:32947:::1}   Final diagnoses:  None    ED Discharge Orders     None

## 2023-12-12 NOTE — Discharge Instructions (Addendum)
 Please read and follow all provided instructions.  Call Dr. Luciano office on Monday about your mild sinus disease and for close follow up .   Your diagnoses today include:  1. Neck swelling   2. Arthralgia, unspecified joint     Tests performed today include: Blood cell counts: Slightly elevated white blood cell count, anemia slightly pronounced from previous Metabolic panel: No concerns CT scan of your neck Vital signs. See below for your results today.   Medications prescribed:   Take any prescribed medications only as directed.  Home care instructions:  Follow any educational materials contained in this packet.  BE VERY CAREFUL not to take multiple medicines containing Tylenol  (also called acetaminophen ). Doing so can lead to an overdose which can damage your liver and cause liver failure and possibly death.   Follow-up instructions: Please follow-up with your primary care provider in the next 3 days for further evaluation of your symptoms.   Return instructions:  Please return to the Emergency Department if you experience worsening symptoms.  Please return if you have any other emergent concerns.  Additional Information:  Your vital signs today were: BP 124/84   Pulse 95   Temp 98.3 F (36.8 C) (Oral)   Resp 18   SpO2 98%  If your blood pressure (BP) was elevated above 135/85 this visit, please have this repeated by your doctor within one month. --------------

## 2023-12-12 NOTE — ED Provider Notes (Signed)
 Physical Exam  BP 124/84   Pulse 95   Temp 98.3 F (36.8 C) (Oral)   Resp 18   SpO2 98%   Physical Exam Vitals and nursing note reviewed.  Constitutional:      General: Dawn Meyer is not in acute distress.    Appearance: Dawn Meyer is not toxic-appearing.  HENT:     Mouth/Throat:     Mouth: Mucous membranes are moist.     Comments: Right sided anterior neck swelling. No overlying erythema or increase in warmth.  No fluctuance induration.  Nonpulsatile.  No trismus.  Controlling secretions.  Normal speech. Cardiovascular:     Rate and Rhythm: Normal rate.  Pulmonary:     Effort: Pulmonary effort is normal. No respiratory distress.  Skin:    General: Skin is warm and dry.  Neurological:     Mental Status: Dawn Meyer is alert.     Procedures  Procedures  ED Course / MDM   Clinical Course as of 12/12/23 1516  Sat Dec 12, 2023  1457 Abs Immature Granulocytes: 0.04 [AA]    Clinical Course User Index [AA] Isidor Nakai, Student-PA   Medical Decision Making Amount and/or Complexity of Data Reviewed Labs: ordered. Radiology: ordered.  Risk OTC drugs. Prescription drug management.   Accepted handoff at shift change from Geiple PA-C. Please see prior provider note for more detail.   Current awaiting on CT of the neck.   CT of the neck shows 1. Redemonstrated postoperative changes in the right parotid region from prior resection of a skin malignancy. No evidence of a recurrent mass at this site. 2. No mass, pathologically enlarged lymph nodes or definite inflammatory changes identified within the neck. 3. Asymmetrically prominent enhancement of the right parotid and right submandibular glands, unchanged and likely related to prior radiation therapy. 4. Mild bilateral frontal and ethmoid sinus disease. 5. Cervical spondylosis. 6. Unchanged mild chronic T5 superior endplate vertebral compression deformity. 7. Aortic Atherosclerosis  Patient is not having any nasal congestion or runny nose.   Denies any sinus pressure.  Her CT does not show any new changes.  Recommended calling Dr. Sharie office on Monday for follow-up.  Given that Dawn Meyer does not have any signs of sinusitis, do not think any antibiotics are needed at this time.  Could be some lymphedema from postop.  I do not feel any pulsatile changes.  There is no overlying erythema or warmth.  No crepitus or induration or fluctuance palpated.  Recommended close outpatient follow-up. Dawn Meyer was just seen and evaluated by Dr. Luciano yesterday when Dawn Meyer had these symptoms, and he did not note any acute findings/changes. Dawn Meyer is hemodynamically stable.  We discussed return precautions red flag symptoms with her and feeling present in room.  They verbalized understanding and agreed to the plan.  Patient stable to be discharged home to condition.  Results for orders placed or performed during the hospital encounter of 12/12/23  CBC with Differential   Collection Time: 12/12/23  1:41 PM  Result Value Ref Range   WBC 11.4 (H) 4.0 - 10.5 K/uL   RBC 3.62 (L) 3.87 - 5.11 MIL/uL   Hemoglobin 11.4 (L) 12.0 - 15.0 g/dL   HCT 64.3 (L) 63.9 - 53.9 %   MCV 98.3 80.0 - 100.0 fL   MCH 31.5 26.0 - 34.0 pg   MCHC 32.0 30.0 - 36.0 g/dL   RDW 86.8 88.4 - 84.4 %   Platelets 155 150 - 400 K/uL   nRBC 0.0 0.0 - 0.2 %  Neutrophils Relative % 84 %   Neutro Abs 9.5 (H) 1.7 - 7.7 K/uL   Lymphocytes Relative 7 %   Lymphs Abs 0.8 0.7 - 4.0 K/uL   Monocytes Relative 8 %   Monocytes Absolute 0.9 0.1 - 1.0 K/uL   Eosinophils Relative 1 %   Eosinophils Absolute 0.1 0.0 - 0.5 K/uL   Basophils Relative 0 %   Basophils Absolute 0.0 0.0 - 0.1 K/uL   Immature Granulocytes 0 %   Abs Immature Granulocytes 0.04 0.00 - 0.07 K/uL  Basic metabolic panel   Collection Time: 12/12/23  1:41 PM  Result Value Ref Range   Sodium 134 (L) 135 - 145 mmol/L   Potassium 3.7 3.5 - 5.1 mmol/L   Chloride 102 98 - 111 mmol/L   CO2 23 22 - 32 mmol/L   Glucose, Bld 126 (H) 70 - 99  mg/dL   BUN 16 8 - 23 mg/dL   Creatinine, Ser 9.27 0.44 - 1.00 mg/dL   Calcium 8.8 (L) 8.9 - 10.3 mg/dL   GFR, Estimated >39 >39 mL/min   Anion gap 9 5 - 15   CT Soft Tissue Neck W Contrast Result Date: 12/12/2023 CLINICAL DATA:  Provided history: Acutely worsening right anterior neck swelling. History of radiation therapy. EXAM: CT NECK WITH CONTRAST TECHNIQUE: Multidetector CT imaging of the neck was performed using the standard protocol following the bolus administration of intravenous contrast. RADIATION DOSE REDUCTION: This exam was performed according to the departmental dose-optimization program which includes automated exposure control, adjustment of the mA and/or kV according to patient size and/or use of iterative reconstruction technique. CONTRAST:  75mL OMNIPAQUE  IOHEXOL  300 MG/ML  SOLN COMPARISON:  Neck CT 11/06/2023. Neck CT 06/12/2023. Chest CT 09/15/2023. FINDINGS: Pharynx and larynx: No appreciable swelling or mass within the oral cavity, pharynx or larynx. Salivary glands: Redemonstrated postsurgical changes in the right parotid region from prior resection of a skin malignancy. No evidence of a recurrent mass at this site. Redemonstrated small calcific foci within the right parotid gland, which may reflect postinflammatory calcifications and/or salivary stones. As before, there is asymmetric hyperenhancement of the right parotid and right submandibular glands, which may reflect sequela of prior radiation therapy. Unremarkable appearance of the left parotid and left submandibular glands. Thyroid : Subcentimeter thyroid  nodules bilaterally, similar to the prior exam. These do not meet measurement criteria for ultrasound follow-up based on size. No follow-up imaging recommended. Reference: J Am Coll Radiol. 2015 Feb;12(2): 143-50. Lymph nodes: No pathologically enlarged lymph nodes identified within the neck. Vascular: The major vascular structures of the neck are patent. Atherosclerotic plaque  within the visualized aortic arch, proximal major branch vessels of the neck and within the carotid arteries. Limited intracranial: No evidence of acute intracranial abnormality within the field of view. Visualized orbits: Right eyelid weight.  No acute orbital finding. Mastoids and visualized paranasal sinuses: Mild mucosal thickening within the inferior frontal sinuses and bilateral ethmoid air cells. No significant mastoid effusion. Skeleton: Cervical spondylosis. Redemonstrated mild chronic T5 superior endplate vertebral compression deformity. Upper chest: No consolidation within the imaged lung apices. Emphysema. IMPRESSION: 1. Redemonstrated postoperative changes in the right parotid region from prior resection of a skin malignancy. No evidence of a recurrent mass at this site. 2. No mass, pathologically enlarged lymph nodes or definite inflammatory changes identified within the neck. 3. Asymmetrically prominent enhancement of the right parotid and right submandibular glands, unchanged and likely related to prior radiation therapy. 4. Mild bilateral frontal and ethmoid sinus disease.  5. Cervical spondylosis. 6. Unchanged mild chronic T5 superior endplate vertebral compression deformity. 7. Aortic Atherosclerosis (ICD10-I70.0) and Emphysema (ICD10-J43.9). Electronically Signed   By: Rockey Childs D.O.   On: 12/12/2023 15:24      Bernis Ernst, PA-C 12/12/23 2333    Pamella Ozell LABOR, DO 12/15/23 1538

## 2023-12-12 NOTE — ED Triage Notes (Signed)
 Patient presents due to swelling of her neck and sore throat. Denies issues swallowing and breathing. Last radiation treatment was in April of this year. She also complains of arthritic pain flair up in multiple areas. Swelling of the right hand noted. Symptoms started this passed Thursday.

## 2023-12-14 ENCOUNTER — Ambulatory Visit: Attending: Physician Assistant | Admitting: Physician Assistant

## 2023-12-14 ENCOUNTER — Encounter: Payer: Self-pay | Admitting: Physician Assistant

## 2023-12-14 VITALS — BP 106/65 | HR 85 | Resp 16 | Ht 65.5 in | Wt 133.4 lb

## 2023-12-14 DIAGNOSIS — F17200 Nicotine dependence, unspecified, uncomplicated: Secondary | ICD-10-CM

## 2023-12-14 DIAGNOSIS — Z13220 Encounter for screening for lipoid disorders: Secondary | ICD-10-CM

## 2023-12-14 DIAGNOSIS — M0579 Rheumatoid arthritis with rheumatoid factor of multiple sites without organ or systems involvement: Secondary | ICD-10-CM

## 2023-12-14 DIAGNOSIS — I10 Essential (primary) hypertension: Secondary | ICD-10-CM

## 2023-12-14 DIAGNOSIS — Z85828 Personal history of other malignant neoplasm of skin: Secondary | ICD-10-CM | POA: Diagnosis not present

## 2023-12-14 DIAGNOSIS — M19071 Primary osteoarthritis, right ankle and foot: Secondary | ICD-10-CM | POA: Diagnosis not present

## 2023-12-14 DIAGNOSIS — Z79899 Other long term (current) drug therapy: Secondary | ICD-10-CM | POA: Diagnosis not present

## 2023-12-14 DIAGNOSIS — M19042 Primary osteoarthritis, left hand: Secondary | ICD-10-CM

## 2023-12-14 DIAGNOSIS — M19072 Primary osteoarthritis, left ankle and foot: Secondary | ICD-10-CM

## 2023-12-14 DIAGNOSIS — E559 Vitamin D deficiency, unspecified: Secondary | ICD-10-CM

## 2023-12-14 DIAGNOSIS — N814 Uterovaginal prolapse, unspecified: Secondary | ICD-10-CM

## 2023-12-14 DIAGNOSIS — Z8709 Personal history of other diseases of the respiratory system: Secondary | ICD-10-CM

## 2023-12-14 DIAGNOSIS — M810 Age-related osteoporosis without current pathological fracture: Secondary | ICD-10-CM | POA: Diagnosis not present

## 2023-12-14 DIAGNOSIS — M19041 Primary osteoarthritis, right hand: Secondary | ICD-10-CM | POA: Diagnosis not present

## 2023-12-14 LAB — HEPATIC FUNCTION PANEL
AG Ratio: 1.4 (calc) (ref 1.0–2.5)
ALT: 19 U/L (ref 6–29)
AST: 12 U/L (ref 10–35)
Albumin: 3.8 g/dL (ref 3.6–5.1)
Alkaline phosphatase (APISO): 124 U/L (ref 37–153)
Bilirubin, Direct: 0.1 mg/dL (ref 0.0–0.2)
Globulin: 2.7 g/dL (ref 1.9–3.7)
Indirect Bilirubin: 0.3 mg/dL (ref 0.2–1.2)
Total Bilirubin: 0.4 mg/dL (ref 0.2–1.2)
Total Protein: 6.5 g/dL (ref 6.1–8.1)

## 2023-12-14 LAB — LIPID PANEL
Cholesterol: 138 mg/dL (ref <200)
HDL: 45 mg/dL — ABNORMAL LOW (ref >=50)
LDL Cholesterol (Calc): 72 mg/dL
Non-HDL Cholesterol (Calc): 93 mg/dL (ref <130)
Total CHOL/HDL Ratio: 3.1 (calc) (ref <5.0)
Triglycerides: 130 mg/dL (ref <150)

## 2023-12-14 MED ORDER — CELECOXIB 100 MG PO CAPS: 100.0000 mg | ORAL_CAPSULE | Freq: Two times a day (BID) | ORAL | 0 refills | Status: DC | PRN

## 2023-12-14 NOTE — Progress Notes (Signed)
 Office Visit Note  Patient: Dawn Meyer             Date of Birth: Oct 17, 1955           MRN: [id number]             PCP: Cook, Jayce G, DO Referring: Cook, Jayce G, DO Visit Date: 12/14/2023 Occupation: @GUAROCC @  Subjective:  Flare   History of Present Illness: ALMETTA LIDDICOAT is a 68 y.o. female with history of seropositive rheumatoid arthritis and osteoarthritis.  Patient was last seen in the office on 04/14/2023.  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  Patient required surgical intervention on 06/24/2023 after Mohs surgery for basoquamous cell carcinoma.  Patient is that she has required a total of 4 surgeries as well as radiation therapy.  She has no other upcoming surgery scheduled and has completed radiation therapy.  She does not require chemotherapy at this time.  Patient states that she has been off of Humira  since December 2024.  She never started on Orencia.  She is apprehensive to reinitiate any Biologics at this time.  She presents today experiencing a flare involving multiple joints.  Her flare started on Thursday with no identifiable trigger.  Patient states that she is currently having pain and swelling in her right hand, right shoulder, and the right ankle.  She has tried taking Tylenol  over-the-counter as well as using Advil  cream topically for pain relief. She has continued to see dermatology every 2 to 3 weeks.  She is in the process of quitting smoking but has been having increased anxiety.  She declined the use of prednisone  for the current flare she is experiencing.        Activities of Daily Living:  Patient denies morning stiffness  Patient Denies nocturnal pain.  Difficulty dressing/grooming: Denies Difficulty climbing stairs: Denies Difficulty getting out of chair: Denies Difficulty using hands for taps, buttons, cutlery, and/or writing: Denies  Review of Systems   Constitutional:  Positive for fatigue.  HENT:  Positive for mouth dryness. Negative for mouth sores.   Eyes:  Positive for dryness.  Respiratory:  Positive for shortness of breath.   Cardiovascular:  Negative for chest pain and palpitations.  Gastrointestinal:  Positive for diarrhea. Negative for blood in stool and constipation.  Endocrine: Negative for increased urination.  Genitourinary:  Negative for involuntary urination.  Musculoskeletal:  Positive for joint pain, joint pain, joint swelling, myalgias, muscle weakness, muscle tenderness and myalgias. Negative for gait problem and morning stiffness.  Skin:  Negative for color change, rash, hair loss and sensitivity to sunlight.  Allergic/Immunologic: Negative for susceptible to infections.  Neurological:  Negative for dizziness and headaches.  Hematological:  Negative for swollen glands.  Psychiatric/Behavioral:  Positive for depressed mood and sleep disturbance. The patient is nervous/anxious.     PMFS History:  Patient Active Problem List   Diagnosis Date Noted   Insomnia 11/12/2023   Weight loss 11/02/2023   GERD (gastroesophageal reflux disease) 11/02/2023   Hypokalemia 10/22/2023   Coronary artery disease involving native coronary artery of native heart without angina pectoris 09/18/2023   Anxiety 09/18/2023   SOB (shortness of breath) 09/18/2023   Squamous cell carcinoma of skin of face 07/22/2023   Acquired facial deformity 06/24/2023   Hyperlipidemia 05/14/2023   GOLD ? COPD 07/02/2022   Essential hypertension 10/10/2021   Allergic rhinitis 11/17/2017   Cigarette smoker 11/17/2017   Rheumatoid  arthritis involving multiple sites with positive rheumatoid factor (HCC) 08/18/2016   OP (osteoporosis) 08/18/2016    Past Medical History:  Diagnosis Date   Asthma    COPD (chronic obstructive pulmonary disease) (HCC)    GERD (gastroesophageal reflux disease)    Hypertension    Prolapsed uterus    per patient, dx by  OB-GYN   RA (rheumatoid arthritis) (HCC)    Smoker    1/4/ppd   squamous cell of the rt face 10/2022    Family History  Problem Relation Age of Onset   Congestive Heart Failure Mother    Heart attack Mother    Diabetes Father    Heart Problems Father        pacemaker   Diverticulitis Sister    Hypertension Sister    Heart attack Brother    Healthy Daughter    Colon cancer Neg Hx    Past Surgical History:  Procedure Laterality Date   ADJACENT TISSUE TRANSFER/TISSUE REARRANGEMENT Right 06/24/2023   Procedure: ADJACENT TISSUE TRANSFER/TISSUE REARRANGEMENT;  Surgeon: Luciano Standing, MD;  Location: [facility];  Service: ENT;  Laterality: Right;   COLONOSCOPY WITH PROPOFOL  N/A 08/09/2021   Procedure: COLONOSCOPY WITH PROPOFOL ;  Surgeon: Shaaron Lamar HERO, MD;  [facility];  Service: Endoscopy;  Laterality: N/A;  7:30am   EXCISION MASS HEAD Right 06/24/2023   Procedure: EXCISION AND REPAIR OF RIGHT CHEEK MOHS DEFECT;  Surgeon: Luciano Standing, MD;  Location: [facility];  Service: ENT;  Laterality: Right;   EXCISION MASS HEAD Bilateral 08/03/2023   Procedure: 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; with primary closure;  Surgeon: Luciano Standing, MD;  Location: [facility];  Service: ENT;  Laterality: Bilateral;  Lower Lip   [place] procedure  04/28/2022   for prolapsed cervix   MOHS SURGERY     SQUAMOUS CELL CARCINOMA EXCISION     VAGINAL DELIVERY     Social History   Social History Narrative   Not on file   Immunization History  Administered Date(s) Administered   Fluad Trivalent(High Dose 65+) 03/09/2023   Influenza Inj Mdck Quad Pf 03/12/2017   Influenza,inj,Quad PF,6+ Mos 02/14/2019   Influenza-Unspecified 03/20/2016, 03/11/2018, 03/13/2020, 03/16/2021, 03/19/2022   Janssen (J&J) SARS-COV-2 Vaccination 08/31/2019   Td 08/28/2009     Objective: Vital Signs: BP 106/65 (BP Location: Left Arm, Patient  Position: Sitting, Cuff Size: Normal)   Pulse 85   Resp 16   Ht 5' 5.5 (1.664 m)   Wt 133 lb 6.4 oz (60.5 kg)   BMI 21.86 kg/m    Physical Exam Vitals and nursing note reviewed.  Constitutional:      Appearance: She is well-developed.  HENT:     Head: Normocephalic and atraumatic.  Eyes:     Conjunctiva/sclera: Conjunctivae normal.  Cardiovascular:     Rate and Rhythm: Normal rate and regular rhythm.     Heart sounds: Normal heart sounds.  Pulmonary:     Effort: Pulmonary effort is normal.     Breath sounds: Normal breath sounds.  Abdominal:     General: Bowel sounds are normal.     Palpations: Abdomen is soft.  Musculoskeletal:     Cervical back: Normal range of motion.  Lymphadenopathy:     Cervical: No cervical adenopathy.  Skin:    General: Skin is warm and dry.     Capillary Refill: Capillary refill takes less than 2 seconds.  Neurological:     Mental  Status: She is alert and oriented to person, place, and time.  Psychiatric:        Behavior: Behavior normal.      Musculoskeletal Exam: C-spine, thoracic spine, lumbar spine good range of motion.  Some soreness of the right shoulder with range of motion.  Small rheumatoid nodule on extensor surface of the left elbow.  Wrist joints have good range of motion.  Tenderness and synovitis over the right 2nd and 3rd MCP joints.  PIP and DIP thickening.  Complete fist formation noted.  Hip joints have good range of motion with no groin pain.  Knee joints have good range of motion with no warmth or effusion.  Tenderness and swelling of the right ankle.  CDAI Exam: CDAI Score: -- Patient Global: --; Provider Global: -- Swollen: 3 ; Tender: 4  Joint Exam 12/14/2023      Right  Left  Glenohumeral   Tender     MCP 2  Swollen Tender     MCP 3  Swollen Tender     Ankle  Swollen Tender        Investigation: No additional findings.  Imaging: CT Soft Tissue Neck W Contrast Result Date: 12/12/2023 CLINICAL DATA:  Provided  history: Acutely worsening right anterior neck swelling. History of radiation therapy. EXAM: CT NECK WITH CONTRAST TECHNIQUE: Multidetector CT imaging of the neck was performed using the standard protocol following the bolus administration of intravenous contrast. RADIATION DOSE REDUCTION: This exam was performed according to the departmental dose-optimization program which includes automated exposure control, adjustment of the mA and/or kV according to patient size and/or use of iterative reconstruction technique. CONTRAST:  75mL OMNIPAQUE  IOHEXOL  300 MG/ML  SOLN COMPARISON:  Neck CT 11/06/2023. Neck CT 06/12/2023. Chest CT 09/15/2023. FINDINGS: Pharynx and larynx: No appreciable swelling or mass within the oral cavity, pharynx or larynx. Salivary glands: Redemonstrated postsurgical changes in the right parotid region from prior resection of a skin malignancy. No evidence of a recurrent mass at this site. Redemonstrated small calcific foci within the right parotid gland, which may reflect postinflammatory calcifications and/or salivary stones. As before, there is asymmetric hyperenhancement of the right parotid and right submandibular glands, which may reflect sequela of prior radiation therapy. Unremarkable appearance of the left parotid and left submandibular glands. Thyroid : Subcentimeter thyroid  nodules bilaterally, similar to the prior exam. These do not meet measurement criteria for ultrasound follow-up based on size. No follow-up imaging recommended. Reference: J Am Coll Radiol. 2015 Feb;12(2): 143-50. Lymph nodes: No pathologically enlarged lymph nodes identified within the neck. Vascular: The major vascular structures of the neck are patent. Atherosclerotic plaque within the visualized aortic arch, proximal major branch vessels of the neck and within the carotid arteries. Limited intracranial: No evidence of acute intracranial abnormality within the field of view. Visualized orbits: Right eyelid weight.   No acute orbital finding. Mastoids and visualized paranasal sinuses: Mild mucosal thickening within the inferior frontal sinuses and bilateral ethmoid air cells. No significant mastoid effusion. Skeleton: Cervical spondylosis. Redemonstrated mild chronic T5 superior endplate vertebral compression deformity. Upper chest: No consolidation within the imaged lung apices. Emphysema. IMPRESSION: 1. Redemonstrated postoperative changes in the right parotid region from prior resection of a skin malignancy. No evidence of a recurrent mass at this site. 2. No mass, pathologically enlarged lymph nodes or definite inflammatory changes identified within the neck. 3. Asymmetrically prominent enhancement of the right parotid and right submandibular glands, unchanged and likely related to prior radiation therapy. 4. Mild bilateral frontal and  ethmoid sinus disease. 5. Cervical spondylosis. 6. Unchanged mild chronic T5 superior endplate vertebral compression deformity. 7. Aortic Atherosclerosis (ICD10-I70.0) and Emphysema (ICD10-J43.9). Electronically Signed   By: Rockey Childs D.O.   On: 12/12/2023 15:24    Recent Labs: Lab Results  Component Value Date   WBC 11.4 (H) 12/12/2023   HGB 11.4 (L) 12/12/2023   PLT 155 12/12/2023   NA 134 (L) 12/12/2023   K 3.7 12/12/2023   CL 102 12/12/2023   CO2 23 12/12/2023   GLUCOSE 126 (H) 12/12/2023   BUN 16 12/12/2023   CREATININE 0.72 12/12/2023   BILITOT 0.4 10/18/2023   ALKPHOS 114 10/18/2023   AST 13 (L) 10/18/2023   ALT 10 10/18/2023   PROT 7.1 10/18/2023   ALBUMIN 3.6 10/18/2023   CALCIUM 8.8 (L) 12/12/2023   GFRAA 78 03/13/2020   QFTBGOLD Negative 07/29/2016   QFTBGOLDPLUS Negative 11/11/2022    Speciality Comments: Osteoporosis manged by Dr. Dolphus.  She declines treatment-ACY 01/11/2019  Procedures:  No procedures performed Allergies: Doxycycline  and Aquaphor [lanolin-petrolatum ]   Assessment / Plan:     Visit Diagnoses: Rheumatoid arthritis involving  multiple sites with positive rheumatoid factor (HCC) -patient presents today experiencing a flare involving multiple joints.  Her flare started on Thursday involving the right hand and right ankle.  She is also had some increased discomfort and stiffness in the right shoulder.  On examination today she has tenderness and synovitis of the right 2nd and 3rd MCP joints as well as inflammation in the right ankle.  She is not currently taking immunosuppressive agents.  Her last dose of Humira  was administered at the end of December 2024.  The plan at her last office visit in November 2024 was to switch her from Humira  to Orencia due to recurrent skin cancer.  The patient has remained under the close follow-up with dermatology every 2 to 3 weeks.  She has required 4 surgeries for the basal cell and squamous cell carcinomas on the right side of her face.  She is also required radiation therapy which has been completed.  She has not required chemotherapy and has no other upcoming surgery scheduled.   Different treatment options for management of rheumatoid arthritis were discussed today.  She does not want to reinitiate any Biologics at this time.  Discussed the option of using Plaquenil but she would like to hold off on adding any immunomodulatory medications at this time.  She has been taking Tylenol  as needed for symptomatic relief and using topical Advil .   She declined a prednisone  taper at this time.  Prednisone  has caused mood changes in the past. Patient requested to initiate a trial of Celebrex  to alleviate her current symptoms.  A prescription for Celebrex  100 mg 1 capsule twice daily as needed was sent to the pharmacy today.  Advised the patient to take Celebrex  with food and avoid any over-the-counter NSAID use.   Creatinine was 0.72 and GFR was greater than 60 on 12/12/2023.  Plan to monitor renal function closely especially if she remains on Celebrex  long-term.  She will notify us  if she cannot tolerate  taking Celebrex  or continues to have persistent symptoms.  She will follow-up in the office in 3 months or sooner if needed.  Plan: Lipid panel  High risk medication use - Not currently taking any immunosuppressive agents-denied use of biologics.  Previously diagnosed switching to Orencia-patient declined.   Humira  d/c due to recurrent skin cancer.  Discussed option of plaquenil use.  CBC and BMP updated on 12/12/23.  TB gold negative on 11/11/22. Lipid panel updated today.   - Plan: Lipid panel, Hepatic function panel  Lipid screening -Lipid panel updated today.  Plan: Lipid panel  Primary osteoarthritis of both hands: Patient presents today experiencing a flare involving the right hand.  She has tenderness and synovitis of the right 2nd and 3rd MCP joints.  She has been wearing a compression glove and has tried Voltaren gel twice daily for symptomatic relief.   A prescription for Celebrex  was sent to the pharmacy as discussed above.  Primary osteoarthritis of both feet: She presents today with increased pain and inflammation in the right ankle.  She declined a prednisone  taper at this time.  She would like to initiate a trial of Celebrex  100 mg twice daily as needed for symptomatic relief.  She will require close lab monitoring as discussed above.  History of skin cancer - Under close monitoring of dermatology-every 2-3 weeks.  Previous Moh's surgery for squamous cell carcinoma. Lesion on right side of cheek-required 4 surgeries and radiation therapy.    Other medical conditions are listed as follows:  Age-related osteoporosis without current pathological fracture - DEXA 01/19/14 left forearm at the 33% radius site, BMD 0.588 T-score -3.3.  Patient does not want to have a DEXA scan.  Vitamin D  deficiency  Other medical conditions are listed as follows:   Essential hypertension: BP was updated on 106/65.     History of asthma  Smoker  History of COPD - CXR 1 view 06/26/22: Mild  bronchial wall thickening w/ hyperexpanded lungs, prominent interstitial markings, suggestive of COPD.on trelegy-well-controlled.smoker.  Uterine prolapse   Orders: Orders Placed This Encounter  Procedures   Lipid panel   Hepatic function panel   Meds ordered this encounter  Medications   celecoxib  (CELEBREX ) 100 MG capsule    Sig: Take 1 capsule (100 mg total) by mouth 2 (two) times daily as needed.    Dispense:  60 capsule    Refill:  0     Follow-Up Instructions: Return in about 3 months (around 03/15/2024) for Rheumatoid arthritis.   Waddell CHRISTELLA Craze, PA-C  Note - This record has been created using Dragon software.  Chart creation errors have been sought, but may not always  have been located. Such creation errors do not reflect on  the standard of medical care.

## 2023-12-15 ENCOUNTER — Other Ambulatory Visit: Payer: Self-pay

## 2023-12-15 ENCOUNTER — Ambulatory Visit: Payer: Self-pay | Admitting: Physician Assistant

## 2023-12-15 DIAGNOSIS — C44320 Squamous cell carcinoma of skin of unspecified parts of face: Secondary | ICD-10-CM

## 2023-12-15 NOTE — Progress Notes (Signed)
 Hepatic function panel WNL HDL is borderline low. Rest of lipid panel WNL. Please forward results to PCP as requested by the patient.

## 2023-12-16 ENCOUNTER — Telehealth: Payer: Self-pay | Admitting: Radiation Oncology

## 2023-12-16 NOTE — Telephone Encounter (Signed)
 Referral sent from Dr. Izell for Palliative care

## 2023-12-22 ENCOUNTER — Other Ambulatory Visit: Payer: Self-pay | Admitting: Family Medicine

## 2023-12-22 DIAGNOSIS — F419 Anxiety disorder, unspecified: Secondary | ICD-10-CM

## 2023-12-24 DIAGNOSIS — Z85828 Personal history of other malignant neoplasm of skin: Secondary | ICD-10-CM | POA: Diagnosis not present

## 2023-12-24 DIAGNOSIS — Z08 Encounter for follow-up examination after completed treatment for malignant neoplasm: Secondary | ICD-10-CM | POA: Diagnosis not present

## 2023-12-24 DIAGNOSIS — X32XXXD Exposure to sunlight, subsequent encounter: Secondary | ICD-10-CM | POA: Diagnosis not present

## 2023-12-24 DIAGNOSIS — L570 Actinic keratosis: Secondary | ICD-10-CM | POA: Diagnosis not present

## 2023-12-29 ENCOUNTER — Ambulatory Visit: Admitting: Podiatry

## 2023-12-31 ENCOUNTER — Ambulatory Visit (HOSPITAL_COMMUNITY)
Admission: RE | Admit: 2023-12-31 | Discharge: 2023-12-31 | Disposition: A | Discharge status: Home / Self Care | Source: Ambulatory Visit | Attending: Internal Medicine | Admitting: Internal Medicine

## 2023-12-31 DIAGNOSIS — J449 Chronic obstructive pulmonary disease, unspecified: Secondary | ICD-10-CM | POA: Diagnosis not present

## 2023-12-31 LAB — PULMONARY FUNCTION TEST
DL/VA % pred: 62 %
DL/VA: 2.61 ml/min/mmHg/L
DLCO cor % pred: 75 %
DLCO cor: 15.23 ml/min/mmHg
DLCO unc % pred: 70 %
DLCO unc: 14.2 ml/min/mmHg
FEF 25-75 Post: 0.57 L/s
FEF 25-75 Pre: 1.19 L/s
FEF2575-%Change-Post: -52 %
FEF2575-%Pred-Post: 26 %
FEF2575-%Pred-Pre: 55 %
FEV1-%Change-Post: -17 %
FEV1-%Pred-Post: 77 %
FEV1-%Pred-Pre: 94 %
FEV1-Post: 1.92 L
FEV1-Pre: 2.34 L
FEV1FVC-%Change-Post: -5 %
FEV1FVC-%Pred-Pre: 82 %
FEV6-%Change-Post: -16 %
FEV6-%Pred-Post: 98 %
FEV6-%Pred-Pre: 117 %
FEV6-Post: 3.06 L
FEV6-Pre: 3.67 L
FEV6FVC-%Change-Post: 0 %
FEV6FVC-%Pred-Post: 103 %
FEV6FVC-%Pred-Pre: 103 %
FVC-%Change-Post: -13 %
FVC-%Pred-Post: 98 %
FVC-%Pred-Pre: 113 %
FVC-Post: 3.19 L
FVC-Pre: 3.69 L
Post FEV1/FVC ratio: 60 %
Post FEV6/FVC ratio: 99 %
Pre FEV1/FVC ratio: 64 %
Pre FEV6/FVC Ratio: 100 %
RV % pred: 142 %
RV: 3.08 L
TLC % pred: 136 %
TLC: 7.11 L

## 2023-12-31 MED ORDER — ALBUTEROL SULFATE (2.5 MG/3ML) 0.083% IN NEBU
2.5000 mg | INHALATION_SOLUTION | Freq: Once | RESPIRATORY_TRACT | Status: AC
  Administered 2023-12-31: 2.5 mg via RESPIRATORY_TRACT

## 2024-01-01 ENCOUNTER — Inpatient Hospital Stay: Attending: Adult Health | Admitting: Adult Health

## 2024-01-01 ENCOUNTER — Encounter: Payer: Self-pay | Admitting: Adult Health

## 2024-01-01 DIAGNOSIS — Z923 Personal history of irradiation: Secondary | ICD-10-CM | POA: Diagnosis not present

## 2024-01-01 DIAGNOSIS — F17210 Nicotine dependence, cigarettes, uncomplicated: Secondary | ICD-10-CM | POA: Insufficient documentation

## 2024-01-01 DIAGNOSIS — C44320 Squamous cell carcinoma of skin of unspecified parts of face: Secondary | ICD-10-CM | POA: Diagnosis not present

## 2024-01-01 DIAGNOSIS — Z85828 Personal history of other malignant neoplasm of skin: Secondary | ICD-10-CM | POA: Insufficient documentation

## 2024-01-01 NOTE — Patient Instructions (Signed)
 Dear Servando CHRISTELLA Kirk,   Congratulations for your interest in quitting smoking!  Find a program that suits you best: when you want to quit, how you need support, where you live, and how you like to learn.    If you're ready to get started TODAY, consider scheduling a visit through [email or web address].  Appointments are available from 8am to 8pm, Monday to Friday.   Most health insurance plans will cover some level of tobacco cessation visits and medications.    Additional Resources: [employer] are also available to help you quit & provide the support you'll need. Many programs are available in both [place] and Spanish and have a long history of successfully helping people get off and stay off tobacco.    Quit Smoking Apps:  quitSTART at [employer].de QuitGuide?at [employer].dk Online education and resources: Smokefree  at [employer].gov Free Telephone Coaching: QuitNow,  Call 1-800-QUIT-NOW (289-287-2539) or Text- Ready to 7270251291 *Quitline [place] has teamed up with Medicaid to offer a free 14 week program    Vaping- Want to Quit? Free 24/7 support. Call [facility]  [place], [place], [place], [place], [place]  [facility]

## 2024-01-01 NOTE — Progress Notes (Signed)
 SURVIVORSHIP VISIT:  BRIEF ONCOLOGIC HISTORY:  Oncology History  Squamous cell carcinoma of skin of face  07/22/2023 Initial Diagnosis   Squamous cell carcinoma of skin of face   07/22/2023 Cancer Staging   Staging form: Cutaneous Carcinoma of the Head and Neck, AJCC 8th Edition - Clinical stage from 07/22/2023: Stage II (cT2, cN0, cM0) - Signed by Izell Domino, MD on 07/22/2023 Stage prefix: Initial diagnosis Extraosseous extension: Absent   08/03/2023 Surgery   Wide local excision of bilateral lower lip, margins negative   08/24/2023 - 09/07/2023 Radiation Therapy   Plan Name: HN_R_PreAuri Site: Neck Right Technique: IMRT Mode: Photon Dose Per Fraction: 2.5 Gy Prescribed Dose (Delivered / Prescribed): 17.5 Gy / 17.5 Gy Prescribed Fxs (Delivered / Prescribed): 7 / 7   Plan Name: HN_R_PreAur:1 Site: Neck Right Technique: IMRT Mode: Photon Dose Per Fraction: 6 Gy Prescribed Dose (Delivered / Prescribed): 18 Gy / 18 Gy Prescribed Fxs (Delivered / Prescribed): 3 / 3     INTERVAL HISTORY:  Discussed the use of AI scribe software for clinical note transcription with the patient, who gave verbal consent to proceed.  History of Present Illness Dawn Meyer is a 68 year old female who presents for a follow-up regarding her cancer survivorship.  She has been recovering from cancer treatment, including surgery and radiation, and has felt improved over the last two weeks. Her weight is stable between 130 to 133 pounds, aided by regular meals and nutritional supplements.  She underwent a recent right eyelid biopsy with lesion removal near the implant site. She sees a dermatologist every three weeks for ongoing skin issues, including a lesion on her nose and another on her bra strap area, which has healed.  She has chronic obstructive pulmonary disease, exacerbated by hot weather, limiting her outdoor activity. She maintains activity through walking, leg lifts, household chores, and  church cleaning.  She experiences stress and uses clonazepam  for management. She smokes and has unsuccessfully attempted cessation. She is hesitant to use Chantix due to potential side effects.  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 She enjoys reading and adult coloring despite eye issues.  She had neck swelling and sore throat linked to an oral mouth tonic, which improved upon discontinuation. She applies medication to her gums with Q-tips instead of swishing the tonic.    REVIEW OF SYSTEMS:  Review of Systems  Constitutional:  Negative for appetite change, chills, fatigue, fever and unexpected weight change.  HENT:   Negative for hearing loss, lump/mass and trouble swallowing.   Eyes:  Negative for eye problems and icterus.  Respiratory:  Negative for chest tightness, cough and shortness of breath.   Cardiovascular:  Negative for chest pain, leg swelling and palpitations.  Gastrointestinal:  Negative for abdominal distention, abdominal pain, constipation, diarrhea, nausea and vomiting.  Endocrine: Negative for hot flashes.  Genitourinary:  Negative for difficulty urinating.   Musculoskeletal:  Negative for arthralgias.  Skin:  Negative for itching and rash.  Neurological:  Negative for dizziness, extremity weakness, headaches and numbness.  Hematological:  Negative for adenopathy. Does not bruise/bleed easily.  Psychiatric/Behavioral:  Negative for depression. The patient is not nervous/anxious.   Breast: Denies any new nodularity, masses, tenderness, nipple changes, or nipple discharge.       PAST MEDICAL/SURGICAL HISTORY:  Past Medical History:  Diagnosis Date   Asthma    COPD (chronic obstructive pulmonary disease) (HCC)    GERD (gastroesophageal  reflux disease)    Hypertension    Prolapsed uterus    per patient, dx by OB-GYN   RA (rheumatoid arthritis) (HCC)    Smoker    1/4/ppd   squamous cell of  the rt face 10/2022   Past Surgical History:  Procedure Laterality Date   ADJACENT TISSUE TRANSFER/TISSUE REARRANGEMENT Right 06/24/2023   Procedure: ADJACENT TISSUE TRANSFER/TISSUE REARRANGEMENT;  Surgeon: Luciano Standing, MD;  Location: [facility];  Service: ENT;  Laterality: Right;   COLONOSCOPY WITH PROPOFOL  N/A 08/09/2021   Procedure: COLONOSCOPY WITH PROPOFOL ;  Surgeon: Shaaron Lamar HERO, MD;  Location[facility];  Service: Endoscopy;  Laterality: N/A;  7:30am   EXCISION MASS HEAD Right 06/24/2023   Procedure: EXCISION AND REPAIR OF RIGHT CHEEK MOHS DEFECT;  Surgeon: Luciano Standing, MD;  Location: [facility];  Service: ENT;  Laterality: Right;   EXCISION MASS HEAD Bilateral 08/03/2023   Procedure: 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; with primary closure;  Surgeon: Luciano Standing, MD;  Location: [facility];  Service: ENT;  Laterality: Bilateral;  Lower Lip   [place] procedure  04/28/2022   for prolapsed cervix   MOHS SURGERY     SQUAMOUS CELL CARCINOMA EXCISION     VAGINAL DELIVERY       ALLERGIES:  Allergies  Allergen Reactions   Doxycycline  Rash   Aquaphor [Lanolin-Petrolatum ] Itching and Rash    Redness     CURRENT MEDICATIONS:  Outpatient Encounter Medications as of 01/01/2024  Medication Sig   Acetaminophen  (TYLENOL  ARTHRITIS PAIN PO) Take by mouth as needed.   albuterol  (PROVENTIL ) (2.5 MG/3ML) 0.083% nebulizer solution Take 3 mLs (2.5 mg total) by nebulization every 6 (six) hours as needed for wheezing or shortness of breath.   albuterol  (VENTOLIN  HFA) 108 (90 Base) MCG/ACT inhaler INHALE TWO PUFFS INTO THE LUNGS EVERY SIX HOURS AS NEEDED FOR WHEEZING OR SHORTNESS OF BREATH   amLODipine  (NORVASC ) 10 MG tablet TAKE ONE TABLET (10MG  TOTAL) BY MOUTH DAILY   budesonide -glycopyrrolate-formoterol (BREZTRI  AEROSPHERE) 160-9-4.8 MCG/ACT AERO inhaler Inhale 2 puffs into the lungs 2 (two) times daily.   celecoxib   (CELEBREX ) 100 MG capsule Take 1 capsule (100 mg total) by mouth 2 (two) times daily as needed.   clonazePAM  (KLONOPIN ) 0.5 MG tablet TAKE ONE TABLET (0.5MG  TOTAL) BY MOUTH TWO TIMES DAILY AS NEEDED FOR ANXIETY   melatonin 3 MG TABS tablet Take 3 mg by mouth at bedtime.   pantoprazole  (PROTONIX ) 40 MG tablet Take 1 tablet (40 mg total) by mouth daily.   potassium chloride  SA (KLOR-CON  M) 20 MEQ tablet Take 1 tablet (20 mEq total) by mouth daily.   No facility-administered encounter medications on file as of 01/01/2024.     ONCOLOGIC FAMILY HISTORY:  Family History  Problem Relation Age of Onset   Congestive Heart Failure Mother    Heart attack Mother    Diabetes Father    Heart Problems Father        pacemaker   Diverticulitis Sister    Hypertension Sister    Heart attack Brother    Healthy Daughter    Colon cancer Neg Hx      SOCIAL HISTORY:  Social History   Socioeconomic History   Marital status: Married    Spouse name: Not on file   Number of children: Not on file   Years of education: Not on file   Highest education level: Not on file  Occupational History   Not  on file  Tobacco Use   Smoking status: Every Day    Current packs/day: 0.15    Average packs/day: 0.2 packs/day for 40.0 years (6.0 ttl pk-yrs)    Types: Cigarettes    Passive exposure: Never   Smokeless tobacco: Never  Vaping Use   Vaping status: Never Used  Substance and Sexual Activity   Alcohol use: No   Drug use: No   Sexual activity: Not Currently    Birth control/protection: Post-menopausal  Other Topics Concern   Not on file  Social History Narrative   Not on file   Social Drivers of Health   Financial Resource Strain: Not on file  Food Insecurity: No Food Insecurity (07/22/2023)   Hunger Vital Sign    Worried About Running Out of Food in the Last Year: Never true    Ran Out of Food in the Last Year: Never true  Transportation Needs: No Transportation Needs (07/22/2023)   PRAPARE -  Transportation    Lack of Transportation (Medical): No    Lack of Transportation (Non-Medical): No  Physical Activity: Not on file  Stress: Not on file  Social Connections: Not on file  Intimate Partner Violence: Not At Risk (07/22/2023)   Humiliation, Afraid, Rape, and Kick questionnaire    Fear of Current or Ex-Partner: No    Emotionally Abused: No    Physically Abused: No    Sexually Abused: No     OBSERVATIONS/OBJECTIVE:  There were no vitals taken for this visit. GENERAL: Patient is a well appearing female in no acute distress HEENT:  Sclerae anicteric.  Oropharynx clear and moist. No ulcerations or evidence of oropharyngeal candidiasis. Neck is supple.  NODES:  No cervical, supraclavicular, or axillary lymphadenopathy palpated.  BREAST EXAM:  Deferred. LUNGS:  Clear to auscultation bilaterally.  No wheezes or rhonchi. HEART:  Regular rate and rhythm. No murmur appreciated. ABDOMEN:  Soft, nontender.  Positive, normoactive bowel sounds. No organomegaly palpated. MSK:  No focal spinal tenderness to palpation. Full range of motion bilaterally in the upper extremities. EXTREMITIES:  No peripheral edema.   SKIN:  Clear with no obvious rashes or skin changes. No nail dyscrasia. NEURO:  Nonfocal. Well oriented.  Appropriate affect.   LABORATORY DATA:  None for this visit.  DIAGNOSTIC IMAGING:  None for this visit.      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 Imaging from July 19th was good. - Provided information on thriving and surviving post-cancer treatment. - Encouraged participation in support groups and programs at the cancer center. - Scheduled follow-up in one year for survivorship care.  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  Permanent keratosis identified on biopsy. Requires eye drops for comfort.  Chronic obstructive pulmonary disease (COPD) COPD symptoms exacerbated by hot weather, limiting outdoor activity. Manages symptoms by walking indoors and performing leg lifts.  Nicotine dependence, current smoker Current smoker with attempts to quit using patches. Concerned about Chantix side effects and stress related to cessation efforts. - Provided information on smoking cessation resources, including [facility] classes and counseling. - Included smoking cessation information in the after-visit summary.  Chronic stress related to cancer survivorship Experiencing chronic stress due to lifestyle changes post-cancer treatment. On clonazepam  for stress management. - Encouraged participation in support groups and programs at the cancer center. - Provided information on  lifestyle medicine, including healthy eating and exercise.  Dental issues, status post recent tooth fracture Recent tooth fracture with plans to see the dentist next week. - Follow up with the dentist for evaluation and management of the tooth fracture.   Follow up instructions:    -Return to cancer center in one year for LTS f/u with [facility]  - Continue f/u with dermatology, ENT, and Dr. Izell as recommended -She is welcome to return back to the Survivorship Clinic at any time; no additional follow-up needed at this time.  -Consider referral back to survivorship as a long-term survivor for continued surveillance  The patient was provided an opportunity to ask questions and all were answered. The patient agreed with the plan and demonstrated an understanding of the instructions.   Total encounter time:40 minutes*in face-to-face visit time, chart review, lab review, care coordination, order entry, and documentation of the encounter time.    Morna Kendall, NP 01/04/24 11:35 AM Medical Oncology and Hematology [facility] [address] [place], [place] [address] Tel. [phone]    Fax. [phone]  *Total Encounter Time as defined by the Centers for Medicare and Medicaid Services includes, in addition to the face-to-face time of a patient visit (documented in the note above) non-face-to-face time: obtaining and reviewing outside history, ordering and reviewing medications, tests or procedures, care coordination (communications with other health care professionals or caregivers) and documentation in the medical record.

## 2024-01-02 ENCOUNTER — Ambulatory Visit: Payer: Self-pay | Admitting: Internal Medicine

## 2024-01-04 ENCOUNTER — Other Ambulatory Visit: Payer: Self-pay | Admitting: Family Medicine

## 2024-01-04 ENCOUNTER — Ambulatory Visit (HOSPITAL_COMMUNITY)
Admission: RE | Admit: 2024-01-04 | Discharge: 2024-01-04 | Disposition: A | Discharge status: Home / Self Care | Source: Ambulatory Visit | Attending: Cardiology | Admitting: Cardiology

## 2024-01-04 DIAGNOSIS — I2510 Atherosclerotic heart disease of native coronary artery without angina pectoris: Secondary | ICD-10-CM | POA: Insufficient documentation

## 2024-01-04 DIAGNOSIS — E876 Hypokalemia: Secondary | ICD-10-CM

## 2024-01-05 ENCOUNTER — Telehealth: Payer: Self-pay | Admitting: Internal Medicine

## 2024-01-05 NOTE — Telephone Encounter (Signed)
 Due to schedule change for Dr. Darlean on  Wednesday 03/02/24--patient's appointment has been rescheduled to Tuesday 03/01/24 at 10:45 am---will mail new information to patient and she voiced her understanding

## 2024-01-05 NOTE — Progress Notes (Signed)
 Spoke with pt regarding her results from her pft, pt confirmed understanding. NFN

## 2024-01-06 ENCOUNTER — Ambulatory Visit: Payer: Self-pay | Admitting: Radiation Oncology

## 2024-01-07 ENCOUNTER — Other Ambulatory Visit: Payer: Self-pay | Admitting: Family Medicine

## 2024-01-07 ENCOUNTER — Ambulatory Visit: Admitting: Rheumatology

## 2024-01-07 DIAGNOSIS — F419 Anxiety disorder, unspecified: Secondary | ICD-10-CM

## 2024-01-18 ENCOUNTER — Ambulatory Visit: Admitting: Podiatry

## 2024-01-18 ENCOUNTER — Other Ambulatory Visit: Payer: Self-pay | Admitting: Physician Assistant

## 2024-01-18 NOTE — Telephone Encounter (Signed)
 Last Fill: 12/14/2023  Labs: 12/12/2023 Sodium 134, Glucose 126, Calcium 8.8, WBC 11.4, RBC 3.62, Hgb 11.4, Hct 35.6, Neutro Abs 9.5  Next Visit: 03/15/2024  Last Visit: 12/14/2023  DX: Rheumatoid arthritis involving multiple sites with positive rheumatoid factor    Current Dose per office note 12/14/2023: Celebrex  100 mg 1 capsule twice daily as needed   Okay to refill Celebrex ?

## 2024-01-20 ENCOUNTER — Other Ambulatory Visit: Payer: Self-pay | Admitting: Family Medicine

## 2024-01-20 ENCOUNTER — Ambulatory Visit: Payer: Self-pay | Admitting: Family Medicine

## 2024-01-20 MED ORDER — NIRMATRELVIR/RITONAVIR (PAXLOVID)TABLET
ORAL_TABLET | ORAL | 0 refills | Status: DC
Start: 2024-01-20 — End: 2024-01-26

## 2024-01-20 NOTE — Telephone Encounter (Signed)
 FYI Only or Action Required?: Action required by provider: update on patient condition and requesting medication  for positive covid test last night sx x 3 days .  Patient was last seen in primary care on 11/12/2023 by Cook, Jayce G, DO.  Called Nurse Triage reporting Covid Positive.  Symptoms began several days ago.  Interventions attempted: Rest, hydration, or home remedies.  Symptoms are: gradually worsening.  Triage Disposition: Call PCP Within 24 Hours  Patient/caregiver understands and will follow disposition?: No, wishes to speak with PCP   Patient reports difficulty getting husband's Rx in [place] not having medication . Please send to  [id number]- [place], [place]- [address]         Reason for Disposition  [1] HIGH RISK patient (e.g., weak immune system, age > 64 years, obesity with BMI 30 or higher, pregnant, chronic lung disease or other chronic medical condition) AND [2] COVID symptoms (e.g., cough, fever)  (Exceptions: Already seen by PCP and no new or worsening symptoms.)  Answer Assessment - Initial Assessment Questions Patient requesting medication for positive covid test last night. Reports PCP called in medication for her husband. Patient on day 3 of sx. Attempted to offer VV and patient declined reports she does not do those. If Rx sent [facility] [place] [facility] to [place] [facility] does not have medication when it was called in for her husband last week.   Requesting call back when medication has been sent to pharmacy     1. COVID-19 DIAGNOSIS: How do you know that you have COVID? (e.g., positive lab test or self-test, diagnosed by doctor or NP/PA, symptoms after exposure).     At home covid test last night positive 2. COVID-19 EXPOSURE: Was there any known exposure to COVID before the symptoms began? CDC Definition of close contact: within 6 feet (2 meters) for a total of 15 minutes  or more over a 24-hour period.      husband 3. ONSET: When did the COVID-19 symptoms start?      3 days ago  4. WORST SYMPTOM: What is your worst symptom? (e.g., cough, fever, shortness of breath, muscle aches)     Cough productive clear mucus, hx COPD.  5. COUGH: Do you have a cough? If Yes, ask: How bad is the cough?       Yes , no SOB 6. FEVER: Do you have a fever? If Yes, ask: What is your temperature, how was it measured, and when did it start?     no 7. RESPIRATORY STATUS: Describe your breathing? (e.g., normal; shortness of breath, wheezing, unable to speak)      Normal  8. BETTER-SAME-WORSE: Are you getting better, staying the same or getting worse compared to yesterday?  If getting worse, ask, In what way?     Na  9. OTHER SYMPTOMS: Do you have any other symptoms?  (e.g., chills, fatigue, headache, loss of smell or taste, muscle pain, sore throat)     No appetite, cough congestion.  10. HIGH RISK DISEASE: Do you have any chronic medical problems? (e.g., asthma, heart or lung disease, weak immune system, obesity, etc.)       Yes COPD 11. VACCINE: Have you had the COVID-19 vaccine? If Yes, ask: Which one, how many shots, when did you get it?       Na  12. PREGNANCY: Is there any chance you are pregnant? When was your last  menstrual period?       na 13. O2 SATURATION MONITOR:  Do you use an oxygen saturation monitor (pulse oximeter) at home? If Yes, ask What is your reading (oxygen level) today? What is your usual oxygen saturation reading? (e.g., 95%)       na  Protocols used: Coronavirus (COVID-19) Diagnosed or Suspected-A-AH

## 2024-01-21 ENCOUNTER — Ambulatory Visit: Admitting: Family Medicine

## 2024-01-26 ENCOUNTER — Encounter: Payer: Self-pay | Admitting: Family Medicine

## 2024-01-26 ENCOUNTER — Ambulatory Visit: Admitting: Family Medicine

## 2024-01-26 VITALS — BP 135/68 | HR 87 | Ht 65.5 in | Wt 129.0 lb

## 2024-01-26 DIAGNOSIS — E876 Hypokalemia: Secondary | ICD-10-CM | POA: Diagnosis not present

## 2024-01-26 DIAGNOSIS — J449 Chronic obstructive pulmonary disease, unspecified: Secondary | ICD-10-CM

## 2024-01-26 DIAGNOSIS — R634 Abnormal weight loss: Secondary | ICD-10-CM

## 2024-01-26 DIAGNOSIS — I10 Essential (primary) hypertension: Secondary | ICD-10-CM

## 2024-01-26 DIAGNOSIS — F419 Anxiety disorder, unspecified: Secondary | ICD-10-CM | POA: Diagnosis not present

## 2024-01-26 MED ORDER — POTASSIUM CHLORIDE CRYS ER 20 MEQ PO TBCR: 20.0000 meq | EXTENDED_RELEASE_TABLET | Freq: Every day | ORAL | 0 refills | Status: DC

## 2024-01-26 NOTE — Assessment & Plan Note (Signed)
Stable.  Continue Breztri.

## 2024-01-26 NOTE — Patient Instructions (Signed)
 Continue your medications.  Follow up in 3-6 months.

## 2024-01-26 NOTE — Progress Notes (Signed)
 Subjective:  Patient ID: Dawn Meyer, female    DOB: 04/21/1956  Age: 68 y.o. MRN: [id number]  CC:   Chief Complaint  Patient presents with   Medical Management of Chronic Issues    Post covid 10 days    HPI:  68 year old female presents for evaluation of the above.  Recently has had COVID.  She states that Paxlovid  has caused some GI symptoms particularly reflux.  Patient states that she wants to discuss her medications today particular how she is taking them.  Patient inquiring about her gut health and probiotics.  Blood pressure is stable.  COPD stable on Breztri .  Recently started on Celebrex  by rheumatology.  Patient Active Problem List   Diagnosis Date Noted   Insomnia 11/12/2023   Weight loss 11/02/2023   GERD (gastroesophageal reflux disease) 11/02/2023   Coronary artery disease involving native coronary artery of native heart without angina pectoris 09/18/2023   Anxiety 09/18/2023   Squamous cell carcinoma of skin of face 07/22/2023   Acquired facial deformity 06/24/2023   Hyperlipidemia 05/14/2023   COPD  GOLD 1 07/02/2022   Essential hypertension 10/10/2021   Allergic rhinitis 11/17/2017   Cigarette smoker 11/17/2017   Rheumatoid arthritis involving multiple sites with positive rheumatoid factor (HCC) 08/18/2016   OP (osteoporosis) 08/18/2016    Social Hx   Social History   Socioeconomic History   Marital status: Married    Spouse name: Not on file   Number of children: Not on file   Years of education: Not on file   Highest education level: Not on file  Occupational History   Not on file  Tobacco Use   Smoking status: Every Day    Current packs/day: 0.15    Average packs/day: 0.2 packs/day for 40.0 years (6.0 ttl pk-yrs)    Types: Cigarettes    Passive exposure: Never   Smokeless tobacco: Never  Vaping Use   Vaping status: Never Used  Substance and Sexual Activity   Alcohol use: No   Drug use: No   Sexual activity: Not Currently     Birth control/protection: Post-menopausal  Other Topics Concern   Not on file  Social History Narrative   Not on file   Social Drivers of Health   Financial Resource Strain: Not on file  Food Insecurity: No Food Insecurity (07/22/2023)   Hunger Vital Sign    Worried About Running Out of Food in the Last Year: Never true    Ran Out of Food in the Last Year: Never true  Transportation Needs: No Transportation Needs (07/22/2023)   PRAPARE - Transportation    Lack of Transportation (Medical): No    Lack of Transportation (Non-Medical): No  Physical Activity: Not on file  Stress: Not on file  Social Connections: Not on file    Review of Systems Per HPI  Objective:  BP 135/68   Pulse 87   Ht 5' 5.5 (1.664 m)   Wt 129 lb (58.5 kg)   SpO2 100%   BMI 21.14 kg/m      01/26/2024   11:10 AM 01/26/2024   11:07 AM 12/14/2023   10:38 AM  BP/Weight  Systolic BP 135 160 106  Diastolic BP 68 75 65  Wt. (Lbs)  129 133.4  BMI  21.14 kg/m2 21.86 kg/m2    Physical Exam Vitals and nursing note reviewed.  Constitutional:      General: She is not in acute distress. HENT:     Head: Normocephalic and atraumatic.  Cardiovascular:     Rate and Rhythm: Normal rate and regular rhythm.  Pulmonary:     Effort: Pulmonary effort is normal.     Breath sounds: Normal breath sounds. No wheezing or rales.  Neurological:     Mental Status: She is alert.  Psychiatric:     Comments: Flat affect.  Depressed mood.     Lab Results  Component Value Date   WBC 11.4 (H) 12/12/2023   HGB 11.4 (L) 12/12/2023   HCT 35.6 (L) 12/12/2023   PLT 155 12/12/2023   GLUCOSE 126 (H) 12/12/2023   CHOL 138 12/14/2023   TRIG 130 12/14/2023   HDL 45 (L) 12/14/2023   LDLCALC 72 12/14/2023   ALT 19 12/14/2023   AST 12 12/14/2023   NA 134 (L) 12/12/2023   K 3.7 12/12/2023   CL 102 12/12/2023   CREATININE 0.72 12/12/2023   BUN 16 12/12/2023   CO2 23 12/12/2023   TSH 0.702 09/12/2017     Assessment &  Plan:  Essential hypertension Assessment & Plan: Stable.  Continue current medications.   Weight loss Assessment & Plan: Continues to lose weight.  Advised to increase intake.  Patient desires thyroid  testing.  Ordered today.  Orders: -     TSH  Anxiety Assessment & Plan: Stable currently.   COPD  GOLD 1 Assessment & Plan: Stable.  Continue Breztri .   Hypokalemia -     Potassium Chloride  Crys ER; Take 1 tablet (20 mEq total) by mouth daily.  Dispense: 90 tablet; Refill: 0    Follow-up: 3-6 months; sooner if needed  Jacqulyn Ahle DO [facility]

## 2024-01-26 NOTE — Assessment & Plan Note (Signed)
 Stable.  Continue current medications.

## 2024-01-26 NOTE — Assessment & Plan Note (Signed)
 Continues to lose weight.  Advised to increase intake.  Patient desires thyroid  testing.  Ordered today.

## 2024-01-26 NOTE — Assessment & Plan Note (Signed)
 Stable currently

## 2024-01-27 ENCOUNTER — Ambulatory Visit: Payer: Self-pay | Admitting: Family Medicine

## 2024-01-27 LAB — TSH: TSH: 1.11 u[IU]/mL (ref 0.450–4.500)

## 2024-02-04 DIAGNOSIS — L570 Actinic keratosis: Secondary | ICD-10-CM | POA: Diagnosis not present

## 2024-02-04 DIAGNOSIS — Z85828 Personal history of other malignant neoplasm of skin: Secondary | ICD-10-CM | POA: Diagnosis not present

## 2024-02-04 DIAGNOSIS — Z08 Encounter for follow-up examination after completed treatment for malignant neoplasm: Secondary | ICD-10-CM | POA: Diagnosis not present

## 2024-02-04 DIAGNOSIS — X32XXXD Exposure to sunlight, subsequent encounter: Secondary | ICD-10-CM | POA: Diagnosis not present

## 2024-02-08 DIAGNOSIS — H02821 Cysts of right upper eyelid: Secondary | ICD-10-CM | POA: Diagnosis not present

## 2024-02-08 DIAGNOSIS — H029 Unspecified disorder of eyelid: Secondary | ICD-10-CM | POA: Diagnosis not present

## 2024-02-08 DIAGNOSIS — M952 Other acquired deformity of head: Secondary | ICD-10-CM | POA: Diagnosis not present

## 2024-02-08 DIAGNOSIS — J3489 Other specified disorders of nose and nasal sinuses: Secondary | ICD-10-CM | POA: Diagnosis not present

## 2024-02-08 DIAGNOSIS — T66XXXD Radiation sickness, unspecified, subsequent encounter: Secondary | ICD-10-CM | POA: Diagnosis not present

## 2024-02-08 DIAGNOSIS — G510 Bell's palsy: Secondary | ICD-10-CM | POA: Diagnosis not present

## 2024-02-22 ENCOUNTER — Ambulatory Visit: Admitting: Podiatry

## 2024-02-22 ENCOUNTER — Encounter: Payer: Self-pay | Admitting: Podiatry

## 2024-02-22 DIAGNOSIS — M216X1 Other acquired deformities of right foot: Secondary | ICD-10-CM | POA: Diagnosis not present

## 2024-02-22 DIAGNOSIS — Q828 Other specified congenital malformations of skin: Secondary | ICD-10-CM | POA: Diagnosis not present

## 2024-02-22 NOTE — Progress Notes (Signed)
 Subjective: Chief Complaint  Patient presents with   RFC    [id number] Routine foot care and callouses bilateral    68 year old female presents the office with concern of bilateral foot pain, callus formation.  She said the calluses are starting to come painful again.  Continues with offloading pads which does help.  She  Objective: AAO x3, NAD DP/PT pulses palpable bilaterally, CRT less than 3 seconds Hyperkeratotic lesions noted submetatarsal 3 on the right foot as well as left plantar hallux.  There is callus formation present submetatarsal 5 on the left foot.  There is no underlying ulceration drainage or any signs of infection noted today.  Prominent metatarsal head.  Bunion noted. No pain with calf compression, swelling, warmth, erythema  Assessment: Hyperkeratotic lesions to digital deformity, prominent metatarsal heads  Plan: -All treatment options discussed with the patient including all alternatives, risks, complications.  -Sharply debrided the hyperkeratotic lesions with any complications x 3.  Continue moisturizer and offloading daily.  She been using offloading pads which been helpful we will continue with this.  She is monitoring signs or symptoms of infection or skin breakdown.  As a courtesy to debride the nails as they was minimal debridement necessary today. -Patient encouraged to call the office with any questions, concerns, change in symptoms.   Return in about 3 months (around 05/23/2024).  Donnice JONELLE Fees DPM

## 2024-02-23 ENCOUNTER — Other Ambulatory Visit: Payer: Self-pay | Admitting: Physician Assistant

## 2024-02-23 ENCOUNTER — Other Ambulatory Visit: Payer: Self-pay | Admitting: Family Medicine

## 2024-02-23 DIAGNOSIS — F419 Anxiety disorder, unspecified: Secondary | ICD-10-CM

## 2024-02-23 NOTE — Telephone Encounter (Signed)
 Last Fill: 01/18/2024  Labs: 12/12/2023 Sodium 134, Glucose 126, Calcium 8.8, WBC 11.4, RBC 3.62, Hgb 11.4, Neutro Abs 9.5 Hct 35.6,   Next Visit: 03/15/2024  Last Visit: 12/14/2023  DX: Rheumatoid arthritis involving multiple sites with positive rheumatoid factor    Current Dose per office note 12/14/2023: Celebrex  100 mg 1 capsule twice daily as needed   Okay to refill Celebrex ?

## 2024-02-24 DIAGNOSIS — H04123 Dry eye syndrome of bilateral lacrimal glands: Secondary | ICD-10-CM | POA: Diagnosis not present

## 2024-02-29 ENCOUNTER — Encounter: Payer: Self-pay | Admitting: *Deleted

## 2024-02-29 ENCOUNTER — Other Ambulatory Visit: Payer: Self-pay | Admitting: *Deleted

## 2024-02-29 ENCOUNTER — Ambulatory Visit: Payer: Self-pay | Admitting: Cardiology

## 2024-02-29 MED ORDER — ATORVASTATIN CALCIUM 20 MG PO TABS: 20.0000 mg | ORAL_TABLET | Freq: Every day | ORAL | 6 refills | Status: AC

## 2024-03-01 ENCOUNTER — Encounter: Payer: Self-pay | Admitting: Internal Medicine

## 2024-03-01 ENCOUNTER — Ambulatory Visit: Admitting: Internal Medicine

## 2024-03-01 VITALS — BP 152/69 | HR 90 | Ht 65.5 in | Wt 130.2 lb

## 2024-03-01 DIAGNOSIS — F17210 Nicotine dependence, cigarettes, uncomplicated: Secondary | ICD-10-CM

## 2024-03-01 DIAGNOSIS — J449 Chronic obstructive pulmonary disease, unspecified: Secondary | ICD-10-CM | POA: Diagnosis not present

## 2024-03-01 MED ORDER — FAMOTIDINE 20 MG PO TABS: ORAL_TABLET | ORAL | 11 refills | Status: AC

## 2024-03-01 NOTE — Progress Notes (Signed)
 Office Visit Note  Patient: Dawn Meyer             Date of Birth: 28-Sep-1955           MRN: [id number]             PCP: Cook, Jayce G, DO Referring: Cook, Jayce G, DO Visit Date: 03/15/2024 Occupation: Data Unavailable  Subjective:  Medication monitoring  History of Present Illness: Dawn Meyer is a 68 y.o. female with history of rheumatoid arthritis and osteaothritis.  Patient is not currently taking any immunosuppressive agents.  Patient was started on Celebrex  100 mg twice daily as needed after her last office visit in July 2025.  She was initially taking Celebrex  twice daily but has been able to reduce to once daily to manage her symptoms.  She denies any signs or symptoms of a rheumatoid arthritis flare.  Patient has continued to take Tylenol  arthritis in the morning and at bedtime to help to alleviate her symptoms. She denies any joint swelling at this time. Patient continues to follow-up with dermatology every 3 weeks.  Patient has completed radiation therapy.  Her appetite has been improving.   Activities of Daily Living:  Patient reports morning stiffness for 0 minutes.   Patient Denies nocturnal pain.  Difficulty dressing/grooming: Denies Difficulty climbing stairs: Denies Difficulty getting out of chair: Denies Difficulty using hands for taps, buttons, cutlery, and/or writing: Denies  Review of Systems  Constitutional:  Positive for fatigue.  HENT:  Positive for mouth dryness. Negative for mouth sores.   Eyes:  Positive for dryness.  Respiratory:  Negative for shortness of breath.   Cardiovascular:  Negative for chest pain and palpitations.  Gastrointestinal:  Negative for blood in stool, constipation and diarrhea.  Endocrine: Negative for increased urination.  Genitourinary:  Negative for involuntary urination.  Musculoskeletal:  Positive for muscle weakness. Negative for joint pain, gait problem, joint pain, joint swelling, myalgias, morning stiffness,  muscle tenderness and myalgias.  Skin:  Negative for color change, rash, hair loss and sensitivity to sunlight.  Allergic/Immunologic: Negative for susceptible to infections.  Neurological:  Negative for dizziness and headaches.  Hematological:  Negative for swollen glands.  Psychiatric/Behavioral:  Positive for sleep disturbance. Negative for depressed mood. The patient is nervous/anxious.     PMFS History:  Patient Active Problem List   Diagnosis Date Noted   Insomnia 11/12/2023   Weight loss 11/02/2023   GERD (gastroesophageal reflux disease) 11/02/2023   Coronary artery disease involving native coronary artery of native heart without angina pectoris 09/18/2023   Anxiety 09/18/2023   Squamous cell carcinoma of skin of face 07/22/2023   Acquired facial deformity 06/24/2023   Hyperlipidemia 05/14/2023   COPD  GOLD 1 07/02/2022   Essential hypertension 10/10/2021   Allergic rhinitis 11/17/2017   Cigarette smoker 11/17/2017   Rheumatoid arthritis involving multiple sites with positive rheumatoid factor (HCC) 08/18/2016   OP (osteoporosis) 08/18/2016    Past Medical History:  Diagnosis Date   Asthma    COPD (chronic obstructive pulmonary disease) (HCC)    GERD (gastroesophageal reflux disease)    H/O eye surgery 2025   Right eye   Hypertension    Prolapsed uterus    per patient, dx by OB-GYN   RA (rheumatoid arthritis) (HCC)    Smoker    1/4/ppd   squamous cell of the rt face 10/2022    Family History  Problem Relation Age of Onset   Congestive Heart Failure Mother  Heart attack Mother    Diabetes Father    Heart Problems Father        pacemaker   Diverticulitis Sister    Hypertension Sister    Heart attack Brother    Healthy Daughter    Colon cancer Neg Hx    Past Surgical History:  Procedure Laterality Date   ADJACENT TISSUE TRANSFER/TISSUE REARRANGEMENT Right 06/24/2023   Procedure: ADJACENT TISSUE TRANSFER/TISSUE REARRANGEMENT;  Surgeon: Luciano Standing,  MD;  Location: [facility];  Service: ENT;  Laterality: Right;   COLONOSCOPY WITH PROPOFOL  N/A 08/09/2021   Procedure: COLONOSCOPY WITH PROPOFOL ;  Surgeon: Shaaron Lamar HERO, MD;  Location[facility];  Service: Endoscopy;  Laterality: N/A;  7:30am   EXCISION MASS HEAD Right 06/24/2023   Procedure: EXCISION AND REPAIR OF RIGHT CHEEK MOHS DEFECT;  Surgeon: Luciano Standing, MD;  Location: [facility];  Service: ENT;  Laterality: Right;   EXCISION MASS HEAD Bilateral 08/03/2023   Procedure: 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; with primary closure;  Surgeon: Luciano Standing, MD;  Location: [facility];  Service: ENT;  Laterality: Bilateral;  Lower Lip   EYE SURGERY Right 2025   [place] procedure  04/28/2022   for prolapsed cervix   MOHS SURGERY     SQUAMOUS CELL CARCINOMA EXCISION     VAGINAL DELIVERY     Social History   Tobacco Use   Smoking status: Every Day    Current packs/day: 0.15    Average packs/day: 0.2 packs/day for 40.0 years (6.0 ttl pk-yrs)    Types: Cigarettes    Passive exposure: Never   Smokeless tobacco: Never  Vaping Use   Vaping status: Never Used  Substance Use Topics   Alcohol use: No   Drug use: No   Social History   Social History Narrative   Not on file     Immunization History  Administered Date(s) Administered   Fluad Trivalent(High Dose 65+) 03/09/2023   Influenza Inj Mdck Quad Pf 03/12/2017   Influenza,inj,Quad PF,6+ Mos 02/14/2019   Influenza-Unspecified 03/20/2016, 03/11/2018, 03/13/2020, 03/16/2021, 03/19/2022   Janssen (J&J) SARS-COV-2 Vaccination 08/31/2019   Td 08/28/2009     Objective: Vital Signs: BP (!) 148/79   Pulse 73   Temp 98.2 F (36.8 C)   Resp 14   Ht 5' 5.5 (1.664 m)   Wt 129 lb 12.8 oz (58.9 kg)   BMI 21.27 kg/m    Physical Exam Vitals and nursing note reviewed.  Constitutional:      Appearance: She is well-developed.  HENT:     Head:  Normocephalic and atraumatic.  Eyes:     Conjunctiva/sclera: Conjunctivae normal.  Cardiovascular:     Rate and Rhythm: Normal rate and regular rhythm.     Heart sounds: Normal heart sounds.  Pulmonary:     Effort: Pulmonary effort is normal.     Breath sounds: Normal breath sounds.  Abdominal:     General: Bowel sounds are normal.     Palpations: Abdomen is soft.  Musculoskeletal:     Cervical back: Normal range of motion.  Lymphadenopathy:     Cervical: No cervical adenopathy.  Skin:    General: Skin is warm and dry.     Capillary Refill: Capillary refill takes less than 2 seconds.  Neurological:     Mental Status: She is alert and oriented to person, place, and time.  Psychiatric:        Behavior: Behavior normal.  Musculoskeletal Exam: C-spine, thoracic spine, lumbar spine have good range of motion.  No midline spinal tenderness.  No SI joint tenderness.  Shoulder joints, elbow joints, wrist joints, MCPs, PIPs, DIPs have good range of motion with no synovitis.  Synovial thickening of right 2nd and 3rd MCP joints.  Complete fist formation bilaterally.  Hip joints have good range of motion with no groin pain.  Knee joints have good range of motion no warmth or effusion.  Ankle joints have good range of motion no tenderness or joint swelling.    CDAI Exam: CDAI Score: -- Patient Global: --; Provider Global: -- Swollen: --; Tender: -- Joint Exam 03/15/2024   No joint exam has been documented for this visit   There is currently no information documented on the homunculus. Go to the Rheumatology activity and complete the homunculus joint exam.  Investigation: No additional findings.  Imaging: No results found.  Recent Labs: Lab Results  Component Value Date   WBC 11.4 (H) 12/12/2023   HGB 11.4 (L) 12/12/2023   PLT 155 12/12/2023   NA 134 (L) 12/12/2023   K 3.7 12/12/2023   CL 102 12/12/2023   CO2 23 12/12/2023   GLUCOSE 126 (H) 12/12/2023   BUN 16 12/12/2023    CREATININE 0.72 12/12/2023   BILITOT 0.4 12/14/2023   ALKPHOS 114 10/18/2023   AST 12 12/14/2023   ALT 19 12/14/2023   PROT 6.5 12/14/2023   ALBUMIN 3.6 10/18/2023   CALCIUM 8.8 (L) 12/12/2023   GFRAA 78 03/13/2020   QFTBGOLD Negative 07/29/2016   QFTBGOLDPLUS Negative 11/11/2022    Speciality Comments: Osteoporosis manged by Dr. Dolphus.  She declines treatment-ACY 01/11/2019  Procedures:  No procedures performed Allergies: Doxycycline  and Aquaphor [lanolin-petrolatum ]   Assessment / Plan:     Visit Diagnoses: Rheumatoid arthritis involving multiple sites with positive rheumatoid factor (HCC): She has no synovitis on examination today.  She has not had any signs or symptoms of a rheumatoid arthritis flare.  She is not currently taking immunosuppressive agents.  She is taking Celebrex  100 mg 1 capsule daily for symptomatic relief.  She has been able to reduce Celebrex  from twice daily to once daily since her symptoms have been manageable.  She does take Tylenol  arthritis 1 tablet in the morning and 1 tablet at bedtime to alleviate discomfort.  She has not noticed any joint swelling.  Her symptoms have been well-controlled on the current treatment regimen.  Plan to update the following lab work today.  She will remain on Celebrex  100 mg 1 capsule daily for pain relief.  Encouraged the patient to take Celebrex  with food and avoid any other additional NSAIDs.  She will notify us  if she develops any signs or symptoms of a flare.  She will follow-up in the office in 6 months or sooner if needed.  High risk medication use - Not currently taking any immunosuppressive agents-denied use of biologics.declined orencia.  Humira  d/c due to recurrent skin cancer. CBC, hepatic function panel, BMP, and lipid panel updated on 12/14/23.  Plan to check the following lab work today.   - Plan: Comprehensive metabolic panel with GFR, CBC with Differential/Platelet  Primary osteoarthritis of both hands: She  has PIP and DIP thickening consistent with osteoarthritis of both hands.  Synovial thickening of the right 2nd and 3rd MCP joints but no synovitis. Complete fist formation bilaterally.    Primary osteoarthritis of both feet: Ankle joints have good ROM with no synovitis.  Under the care of podiatry.  Age-related osteoporosis without current pathological fracture - DEXA 01/19/14 left forearm at the 33% radius site, BMD 0.588 T-score -3.3.  Declined updated DEXA.   Vitamin D  deficiency  Other medical conditions are listed as follows:  Essential hypertension: Blood pressure was elevated today in the office and was rechecked already leaving.  Patient was advised to monitor blood pressure closely to reach out to PCP if blood pressure remains elevated.  History of skin cancer -She continues to follow up with dermatology-every 3 weeks.  Previous Moh's surgery for squamous cell carcinoma. Lesion on right side of cheek-required 6 surgeries and radiation therapy.  She has completed radiation therapy.  History of asthma  Smoker  History of COPD  Uterine prolapse  Orders: Orders Placed This Encounter  Procedures   Comprehensive metabolic panel with GFR   CBC with Differential/Platelet   No orders of the defined types were placed in this encounter.    Follow-Up Instructions: Return in about 5 months (around 08/13/2024) for Rheumatoid arthritis, Osteoarthritis.   Waddell CHRISTELLA Craze, PA-C  Note - This record has been created using Dragon software.  Chart creation errors have been sought, but may not always  have been located. Such creation errors do not reflect on  the standard of medical care.

## 2024-03-01 NOTE — Patient Instructions (Addendum)
 GERD (REFLUX)  is an extremely common cause of respiratory symptoms just like yours , many times with no obvious heartburn at all.    It can be treated with medication, but also with lifestyle changes including elevation of the head of your bed (ideally with 6 -8inch blocks under the headboard of your bed),  Smoking cessation, avoidance of late meals, excessive alcohol, and avoid fatty foods, chocolate, peppermint, colas, red wine, and acidic juices such as orange juice.  NO MINT OR MENTHOL PRODUCTS SO NO COUGH DROPS  USE SUGARLESS CANDY INSTEAD (Jolley ranchers or Stover's or Life Savers) or even ice chips will also do - the key is to swallow to prevent all throat clearing. NO OIL BASED VITAMINS - use powdered substitutes.  Avoid fish oil when coughing.   Pantoprazole  (protonix ) 40 mg   Take  30-60 min before first meal of the day and Pepcid (famotidine)  20 mg after supper until return to office - this is the best way to tell whether stomach acid is contributing to your problem.    Also  Ok to try albuterol  15 min before an activity (on alternating days)  that you know would usually make you short of breath and see if it makes any difference and if makes none then don't take albuterol  after activity unless you can't catch your breath as this means it's the resting that helps, not the albuterol .    Please schedule a follow up visit in 3 months but call sooner if needed

## 2024-03-01 NOTE — Progress Notes (Unsigned)
 Dawn Meyer, female    DOB: 03-14-1956    MRN: [id number]   Brief patient profile:  61   yowf  active smoker some allergies growing up but no need for inhalers  referred to pulmonary clinic in [place]  11/25/2023 by Dawn Meyer for copd eval with onset of doe x 2015 on prn alb but changed to breztri  June 2025 from trelegy with some improvement.  Pt not previously seen by PCCM service.    History of Present Illness  11/25/2023  Pulmonary/ 1st office eval/ Dawn Meyer / [facility]  Chief Complaint  Patient presents with   Establish Care  Dyspnea:  walking indoors due to heat /does walking  at [employer] slower pace than others = MMRC2 = can't walk a nl pace on a flat grade s sob but does fine slow and flat  Cough: no excess production even in am  Sleep: flat / 2 pillows  SABA use: avg 2-3 hfa / nad neb q am  02: none  Rec Plan A = Automatic = Always=    Breztri  1-2 puffs 1st thing in AM  and repeat 12 hours  Work on inhaler technique   Plan B = Backup (to supplement plan A, not to replace it) Only use your albuterol  inhaler as a rescue medication  Plan C = Crisis (instead of Plan B but only if Plan B stops working) - only use your albuterol  nebulizer if you first try Plan B Please schedule a follow up visit in 3 months but call sooner if needed - bring inhalers with you.    I personally reviewed images and/ agree with radiology impression as follows:   Chest CTcuts on cardiac study   01/04/24   Imaged lungs are clear. No pleural effusion or pneumothorax.    03/01/2024  f/u ov/Dawn Meyer office/Dawn Meyer re: GOLD  1 COPD maint on breztri  2bid  still active smoker did  bring inhalers  Chief Complaint  Patient presents with   COPD    F/u breztri  has helped  Dyspnea:  some outdoor walking 10 min twice daily  Cough: some am mucus clears up quickly Sleeping: bed flat 2 pillows no  resp cc  SABA use: much less use on breztri  now  02: none   Lung cancer screening: CT 09/15/23  emphysema. No nodules    No obvious day to day or daytime variability or assoc excess/ purulent sputum or mucus plugs or hemoptysis or cp or chest tightness, subjective wheeze or overt sinus or hb symptoms.    Also denies any obvious fluctuation of symptoms with weather or environmental changes or other aggravating or alleviating factors except as outlined above   No unusual exposure hx or h/o childhood pna/ asthma or knowledge of premature birth.  Current Allergies, Complete Past Medical History, Past Surgical History, Family History, and Social History were reviewed in [employer] record.  ROS  The following are not active complaints unless bolded Hoarseness, sore throat, dysphagia, dental problems, itching, sneezing,  nasal congestion or discharge of excess mucus or purulent secretions, ear ache,   fever, chills, sweats, unintended wt loss or wt gain, classically pleuritic or exertional cp,  orthopnea pnd or arm/hand swelling  or leg swelling, presyncope, palpitations, abdominal pain, anorexia, nausea, vomiting, diarrhea  or change in bowel habits or change in bladder habits, change in stools or change in urine, dysuria, hematuria,  rash, arthralgias, visual complaints, headache, numbness, weakness or ataxia or problems with walking or coordination,  change in mood or  memory.        Current Meds  Medication Sig   Acetaminophen  (TYLENOL  ARTHRITIS PAIN PO) Take by mouth as needed.   albuterol  (PROVENTIL ) (2.5 MG/3ML) 0.083% nebulizer solution Take 3 mLs (2.5 mg total) by nebulization every 6 (six) hours as needed for wheezing or shortness of breath.   albuterol  (VENTOLIN  HFA) 108 (90 Base) MCG/ACT inhaler INHALE TWO PUFFS INTO THE LUNGS EVERY SIX HOURS AS NEEDED FOR WHEEZING OR SHORTNESS OF BREATH   amLODipine  (NORVASC ) 10 MG tablet TAKE ONE TABLET (10MG  TOTAL) BY MOUTH DAILY   atorvastatin (LIPITOR) 20 MG tablet Take 1 tablet (20 mg total) by mouth daily.    budesonide -glycopyrrolate-formoterol (BREZTRI  AEROSPHERE) 160-9-4.8 MCG/ACT AERO inhaler Inhale 2 puffs into the lungs 2 (two) times daily.   celecoxib  (CELEBREX ) 100 MG capsule TAKE ONE CAPSULE (100MG  TOTAL) BY MOUTH TWO TIMES DAILY AS NEEDED.   clonazePAM  (KLONOPIN ) 0.5 MG tablet TAKE ONE TABLET (0.5MG  TOTAL) BY MOUTH TWO TIMES DAILY AS NEEDED FOR ANXIETY   famotidine (PEPCID) 20 MG tablet One after supper   melatonin 3 MG TABS tablet Take 3 mg by mouth at bedtime.   pantoprazole  (PROTONIX ) 40 MG tablet Take 1 tablet (40 mg total) by mouth daily.   potassium chloride  SA (KLOR-CON  M) 20 MEQ tablet Take 1 tablet (20 mEq total) by mouth daily.   Pseudoephedrine-Acetaminophen  (SM NON-ASPRIN SINUS PO) Take by mouth.            Past Medical History:  Diagnosis Date   Asthma    COPD (chronic obstructive pulmonary disease) (HCC)    GERD (gastroesophageal reflux disease)    Hypertension    Prolapsed uterus    per patient, dx by OB-GYN   RA (rheumatoid arthritis) (HCC)    Smoker    1/4/ppd   squamous cell of the rt face 10/2022      Objective:       Wt Readings from Last 3 Encounters:  03/01/24 130 lb 3.2 oz (59.1 kg)  01/26/24 129 lb (58.5 kg)  12/14/23 133 lb 6.4 oz (60.5 kg)      Vital signs reviewed  03/01/2024  - Note at rest 02 sats  100% on RA   General appearance:     somewhat anxious pleasant amb wf nad      HEENT : Oropharynx  clear       NECK :  without  apparent JVD/ palpable Nodes/TM / min pseudowheeze    LUNGS: no acc muscle use,  Min barrel  contour chest wall with bilateral  slightly decreased bs s audible wheeze and  without cough on insp or exp maneuvers and min  Hyperresonant  to  percussion bilaterally    CV:  RRR  no s3 or murmur or increase in P2, and no edema   ABD:  soft and nontender    MS:  Nl gait/ ext warm without deformities Or obvious joint restrictions  calf tenderness, cyanosis or clubbing     SKIN: warm and dry without lesions     NEURO:  alert, approp, nl sensorium with  no motor or cerebellar deficits apparent.          Assessment   Assessment & Plan Chronic obstructive pulmonary disease, unspecified COPD type (HCC)  COPD  GOLD 1 COPD GOLD 1/ Active smoker  - 11/25/2023  After extensive coaching inhaler device,  effectiveness =    75% from a baseline of 50%  - 11/25/2023  Walked on RA  x  3  lap(s) =  approx 450  ft  @ mod pace, stopped due end of study with lowest 02 sats 97% s sob   - PFT's  12/25/23   FEV1 2.34 (94 % ) ratio 0.64  p 0 % improvement from saba p Breztri  x one puff  prior to study with DLCO  14.20 (70%)   and FV curve min concave     GOLD 1 criteria but group E in terms of symptoms/ risk probably because still smoking  (see sep a/p)   >>> no change rx needed = breztri  and approp saba    >>> to cover the upper airway symtoms advised to use hard candy and try GERD rx until next ov          Each maintenance medication was reviewed in detail including emphasizing most importantly the difference between maintenance and prns and under what circumstances the prns are to be triggered using an action plan format where appropriate.  Total time for H and P, chart review, counseling, reviewing hfa/neb  device(s) and generating customized AVS unique to this office visit / same day charting = 26 min       Cigarette smoker Counseled re importance of smoking cessation but did not meet time criteria for separate billing        AVS  Patient Instructions  GERD (REFLUX)  is an extremely common cause of respiratory symptoms just like yours , many times with no obvious heartburn at all.    It can be treated with medication, but also with lifestyle changes including elevation of the head of your bed (ideally with 6 -8inch blocks under the headboard of your bed),  Smoking cessation, avoidance of late meals, excessive alcohol, and avoid fatty foods, chocolate, peppermint, colas, red wine, and acidic juices  such as orange juice.  NO MINT OR MENTHOL PRODUCTS SO NO COUGH DROPS  USE SUGARLESS CANDY INSTEAD (Jolley ranchers or Stover's or Life Savers) or even ice chips will also do - the key is to swallow to prevent all throat clearing. NO OIL BASED VITAMINS - use powdered substitutes.  Avoid fish oil when coughing.   Pantoprazole  (protonix ) 40 mg   Take  30-60 min before first meal of the day and  ADD Pepcid (famotidine)  20 mg after supper until return to office - this is the best way to tell whether stomach acid is contributing to your problem.    Also  Ok to try albuterol  15 min before an activity (on alternating days)  that you know would usually make you short of breath and see if it makes any difference and if makes none then don't take albuterol  after activity unless you can't catch your breath as this means it's the resting that helps, not the albuterol .    Please schedule a follow up visit in 3 months but call sooner if needed        Ozell America, MD 03/02/2024

## 2024-03-02 ENCOUNTER — Ambulatory Visit: Admitting: Internal Medicine

## 2024-03-02 NOTE — Assessment & Plan Note (Addendum)
 Counseled re importance of smoking cessation but did not meet time criteria for separate billing

## 2024-03-02 NOTE — Assessment & Plan Note (Addendum)
 COPD GOLD 1/ Active smoker  - 11/25/2023  After extensive coaching inhaler device,  effectiveness =    75% from a baseline of 50%  - 11/25/2023   Walked on RA  x  3  lap(s) =  approx 450  ft  @ mod pace, stopped due end of study with lowest 02 sats 97% s sob   - PFT's  12/25/23   FEV1 2.34 (94 % ) ratio 0.64  p 0 % improvement from saba p Breztri  x one puff  prior to study with DLCO  14.20 (70%)   and FV curve min concave     GOLD 1 criteria but group E in terms of symptoms/ risk probably because still smoking  (see sep a/p)   >>> no change rx needed = breztri  and approp saba    >>> to cover the upper airway symtoms advised to use hard candy and try GERD rx until next ov          Each maintenance medication was reviewed in detail including emphasizing most importantly the difference between maintenance and prns and under what circumstances the prns are to be triggered using an action plan format where appropriate.  Total time for H and P, chart review, counseling, reviewing hfa/neb  device(s) and generating customized AVS unique to this office visit / same day charting = 26 min

## 2024-03-07 ENCOUNTER — Other Ambulatory Visit: Payer: Self-pay | Admitting: Family Medicine

## 2024-03-07 DIAGNOSIS — Z720 Tobacco use: Secondary | ICD-10-CM

## 2024-03-14 DIAGNOSIS — Y842 Radiological procedure and radiotherapy as the cause of abnormal reaction of the patient, or of later complication, without mention of misadventure at the time of the procedure: Secondary | ICD-10-CM | POA: Diagnosis not present

## 2024-03-14 DIAGNOSIS — T66XXXD Radiation sickness, unspecified, subsequent encounter: Secondary | ICD-10-CM | POA: Diagnosis not present

## 2024-03-14 DIAGNOSIS — G510 Bell's palsy: Secondary | ICD-10-CM | POA: Diagnosis not present

## 2024-03-14 DIAGNOSIS — C4491 Basal cell carcinoma of skin, unspecified: Secondary | ICD-10-CM | POA: Diagnosis not present

## 2024-03-14 DIAGNOSIS — M952 Other acquired deformity of head: Secondary | ICD-10-CM | POA: Diagnosis not present

## 2024-03-15 ENCOUNTER — Ambulatory Visit: Attending: Physician Assistant | Admitting: Physician Assistant

## 2024-03-15 ENCOUNTER — Encounter: Payer: Self-pay | Admitting: Physician Assistant

## 2024-03-15 VITALS — BP 148/79 | HR 73 | Temp 98.2°F | Resp 14 | Ht 65.5 in | Wt 129.8 lb

## 2024-03-15 DIAGNOSIS — Z79899 Other long term (current) drug therapy: Secondary | ICD-10-CM

## 2024-03-15 DIAGNOSIS — M810 Age-related osteoporosis without current pathological fracture: Secondary | ICD-10-CM

## 2024-03-15 DIAGNOSIS — M19042 Primary osteoarthritis, left hand: Secondary | ICD-10-CM

## 2024-03-15 DIAGNOSIS — M19041 Primary osteoarthritis, right hand: Secondary | ICD-10-CM

## 2024-03-15 DIAGNOSIS — I10 Essential (primary) hypertension: Secondary | ICD-10-CM | POA: Diagnosis not present

## 2024-03-15 DIAGNOSIS — M19071 Primary osteoarthritis, right ankle and foot: Secondary | ICD-10-CM | POA: Diagnosis not present

## 2024-03-15 DIAGNOSIS — F17200 Nicotine dependence, unspecified, uncomplicated: Secondary | ICD-10-CM

## 2024-03-15 DIAGNOSIS — Z8709 Personal history of other diseases of the respiratory system: Secondary | ICD-10-CM

## 2024-03-15 DIAGNOSIS — Z85828 Personal history of other malignant neoplasm of skin: Secondary | ICD-10-CM

## 2024-03-15 DIAGNOSIS — E559 Vitamin D deficiency, unspecified: Secondary | ICD-10-CM | POA: Diagnosis not present

## 2024-03-15 DIAGNOSIS — M0579 Rheumatoid arthritis with rheumatoid factor of multiple sites without organ or systems involvement: Secondary | ICD-10-CM

## 2024-03-15 DIAGNOSIS — N814 Uterovaginal prolapse, unspecified: Secondary | ICD-10-CM

## 2024-03-15 DIAGNOSIS — M19072 Primary osteoarthritis, left ankle and foot: Secondary | ICD-10-CM

## 2024-03-15 LAB — COMPREHENSIVE METABOLIC PANEL WITH GFR
AG Ratio: 1.8 (calc) (ref 1.0–2.5)
ALT: 20 U/L (ref 6–29)
AST: 16 U/L (ref 10–35)
Albumin: 4.3 g/dL (ref 3.6–5.1)
Alkaline phosphatase (APISO): 99 U/L (ref 37–153)
BUN: 16 mg/dL (ref 7–25)
CO2: 26 mmol/L (ref 20–32)
Calcium: 9.6 mg/dL (ref 8.6–10.4)
Chloride: 106 mmol/L (ref 98–110)
Creat: 0.86 mg/dL (ref 0.50–1.05)
Globulin: 2.4 g/dL (ref 1.9–3.7)
Glucose, Bld: 112 mg/dL — ABNORMAL HIGH (ref 65–99)
Potassium: 4.6 mmol/L (ref 3.5–5.3)
Sodium: 139 mmol/L (ref 135–146)
Total Bilirubin: 0.6 mg/dL (ref 0.2–1.2)
Total Protein: 6.7 g/dL (ref 6.1–8.1)
eGFR: 74 mL/min/1.73m2 (ref >=60)

## 2024-03-15 LAB — CBC WITH DIFFERENTIAL/PLATELET
Absolute Lymphocytes: 1017 {cells}/uL (ref 850–3900)
Absolute Monocytes: 732 {cells}/uL (ref 200–950)
Basophils Absolute: 25 {cells}/uL (ref 0–200)
Basophils Relative: 0.2 %
Eosinophils Absolute: 198 {cells}/uL (ref 15–500)
Eosinophils Relative: 1.6 %
HCT: 41.9 % (ref 35.0–45.0)
Hemoglobin: 13.9 g/dL (ref 11.7–15.5)
MCH: 32.1 pg (ref 27.0–33.0)
MCHC: 33.2 g/dL (ref 32.0–36.0)
MCV: 96.8 fL (ref 80.0–100.0)
MPV: 10.5 fL (ref 7.5–12.5)
Monocytes Relative: 5.9 %
Neutro Abs: 10428 {cells}/uL — ABNORMAL HIGH (ref 1500–7800)
Neutrophils Relative %: 84.1 %
Platelets: 196 Thousand/uL (ref 140–400)
RBC: 4.33 Million/uL (ref 3.80–5.10)
RDW: 12.9 % (ref 11.0–15.0)
Total Lymphocyte: 8.2 %
WBC: 12.4 Thousand/uL — ABNORMAL HIGH (ref 3.8–10.8)

## 2024-03-16 ENCOUNTER — Ambulatory Visit: Payer: Self-pay | Admitting: Physician Assistant

## 2024-03-16 NOTE — Progress Notes (Signed)
 Glucose is 112, rest of CMP WNL-renal function and hepatic function remain stable-good news!  WBC count remains elevated. Absolute neutrophils are elevated. Rest of CBC WNL

## 2024-04-14 DIAGNOSIS — L570 Actinic keratosis: Secondary | ICD-10-CM | POA: Diagnosis not present

## 2024-04-14 DIAGNOSIS — X32XXXD Exposure to sunlight, subsequent encounter: Secondary | ICD-10-CM | POA: Diagnosis not present

## 2024-04-14 DIAGNOSIS — L821 Other seborrheic keratosis: Secondary | ICD-10-CM | POA: Diagnosis not present

## 2024-04-18 ENCOUNTER — Other Ambulatory Visit: Payer: Self-pay | Admitting: Physician Assistant

## 2024-04-18 ENCOUNTER — Other Ambulatory Visit: Payer: Self-pay | Admitting: Family Medicine

## 2024-04-18 DIAGNOSIS — E876 Hypokalemia: Secondary | ICD-10-CM

## 2024-04-18 NOTE — Telephone Encounter (Signed)
 Last Fill: 02/23/2024  Labs: 03/15/2024 Glucose is 112, rest of CMP WNL WBC count remains elevated. Absolute neutrophils are elevated. Rest of CBC WNL   Next Visit: 09/06/2024  Last Visit: 03/15/2024  DX: Rheumatoid arthritis involving multiple sites with positive rheumatoid factor   Current Dose per office note 03/15/2024: Celebrex  100 mg 1 capsule daily for symptomatic relief.   Okay to refill Celebrex ?

## 2024-05-09 ENCOUNTER — Other Ambulatory Visit: Payer: Self-pay | Admitting: Family Medicine

## 2024-05-09 DIAGNOSIS — K219 Gastro-esophageal reflux disease without esophagitis: Secondary | ICD-10-CM

## 2024-05-23 ENCOUNTER — Ambulatory Visit: Admitting: Podiatry

## 2024-05-23 DIAGNOSIS — L84 Corns and callosities: Secondary | ICD-10-CM

## 2024-05-23 DIAGNOSIS — M216X1 Other acquired deformities of right foot: Secondary | ICD-10-CM | POA: Diagnosis not present

## 2024-05-23 NOTE — Progress Notes (Signed)
 Subjective: Chief Complaint  Patient presents with   Callouses    Patient presents today for routine callous trim patient relates no changes or new symptoms since las visit.    68 year old female presents the office with concern of bilateral foot pain, callus formation.  She said the calluses are starting to come painful again.  Her pain comes from the bottom of the left big toe.  She can use the offloading pad and does not seem to help much.  Continues with offloading pads which does help.  She  Objective: AAO x3, NAD DP/PT pulses palpable bilaterally, CRT less than 3 seconds Hyperkeratotic lesions noted submetatarsal 3 on the right foot as well and the left plantar hallux.  Serious preulcerative on the plantar aspect the left hallux.  There is callus formation present left submetatarsal 5 on the left foot.  There is no underlying ulceration drainage or any signs of infection noted today.  Prominent metatarsal head.  Bunion noted. No pain with calf compression, swelling, warmth, erythema  Assessment: Hyperkeratotic lesions to digital deformity, prominent metatarsal heads  Plan: -All treatment options discussed with the patient including all alternatives, risks, complications.  -Sharply debrided the hyperkeratotic lesions with any complications x 3.  Continue moisturizer and offloading daily.  On the bottom of the left hallux there is preulcerative.  He is monitor closely for any signs or symptoms of infection or skin breakdown.  -Monitor for any clinical signs or symptoms of infection and directed to call the office immediately should any occur or go to the ER.  Return in about 3 months (around 08/21/2024) for pre-ulcerative calluses.  Donnice JONELLE Fees DPM

## 2024-06-06 ENCOUNTER — Other Ambulatory Visit: Payer: Self-pay | Admitting: Family Medicine

## 2024-06-08 ENCOUNTER — Encounter: Payer: Self-pay | Admitting: Cardiology

## 2024-06-08 ENCOUNTER — Ambulatory Visit: Attending: Cardiology | Admitting: Cardiology

## 2024-06-08 VITALS — BP 140/72 | HR 71 | Ht 66.0 in | Wt 130.2 lb

## 2024-06-08 DIAGNOSIS — I10 Essential (primary) hypertension: Secondary | ICD-10-CM

## 2024-06-08 DIAGNOSIS — I2510 Atherosclerotic heart disease of native coronary artery without angina pectoris: Secondary | ICD-10-CM | POA: Diagnosis not present

## 2024-06-08 NOTE — Patient Instructions (Addendum)
 Medication Instructions:  Continue all current medications.   Labwork: none  Testing/Procedures: none  Follow-Up: 6 months   Any Other Special Instructions Will Be Listed Below (If Applicable). BP log x 1 week   If you need a refill on your cardiac medications before your next appointment, please call your pharmacy.

## 2024-06-08 NOTE — Progress Notes (Signed)
 "     Clinical Summary Dawn Meyer is a 69 y.o.female  1.Coronary atherosclerosis - ER visit 09/15/23 with SOB - EKG SR, LAFB. Trop neg x 2.  - CT PE no PE, +coronary and aortic atherosclerosis   -no chest pains. SOB she relates to COPD, often triggered by heat or humidity.  - sedentary since her cancer surgeries. Walks up 10 minutes multiples time a day without significant symptoms   CAD risk factors: HTN, smoker x 50+ years  12/2023 coronary calcium  score: 1200, 99th percentile - started on ASA 81, atorva 20mg  daily - 11/2023 TC 138 HDL 45 TG 130 LDL 72  - no chest pains. Chronic stable SOB related to COPD - doing some exercises at home, walk regularly without specific exertional symptoms.    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    5. HTN - she is compliant with meds   Her parents the Moricles who have beenpatients of mine, her aunt is Heron Finder   Past Medical History:  Diagnosis Date   Asthma    COPD (chronic obstructive pulmonary disease) (HCC)    GERD (gastroesophageal reflux disease)    H/O eye surgery 2025   Right eye   Hypertension    Prolapsed uterus    per patient, dx by OB-GYN   RA (rheumatoid arthritis) (HCC)    Smoker    1/4/ppd   squamous cell of the rt face 10/2022     Allergies[1]   Current Outpatient Medications  Medication Sig Dispense Refill   Acetaminophen  (TYLENOL  ARTHRITIS PAIN PO) Take by mouth as needed.     albuterol  (PROVENTIL ) (2.5 MG/3ML) 0.083% nebulizer solution Take 3 mLs (2.5 mg total) by nebulization every 6 (six) hours as needed for wheezing or shortness of breath. 150 mL 1   albuterol  (VENTOLIN  HFA) 108 (90 Base) MCG/ACT inhaler INHALE TWO PUFFS INTO THE LUNGS EVERY SIX HOURS AS NEEDED FOR WHEEZING OR SHORTNESS OF BREATH 18 g 3   amLODipine  (NORVASC ) 10 MG tablet TAKE ONE TABLET (10MG  TOTAL) BY MOUTH DAILY 90 tablet 1   aspirin EC  81 MG tablet Take 81 mg by mouth daily. Swallow whole.     atorvastatin  (LIPITOR) 20 MG tablet Take 1 tablet (20 mg total) by mouth daily. 30 tablet 6   budesonide -glycopyrrolate-formoterol (BREZTRI  AEROSPHERE) 160-9-4.8 MCG/ACT AERO inhaler Inhale 2 puffs into the lungs 2 (two) times daily. 10.7 g 11   celecoxib  (CELEBREX ) 100 MG capsule TAKE ONE CAPSULE (100MG  TOTAL) BY MOUTH TWO TIMES DAILY AS NEEDED. 60 capsule 2   clonazePAM  (KLONOPIN ) 0.5 MG tablet TAKE ONE TABLET (0.5MG  TOTAL) BY MOUTH TWO TIMES DAILY AS NEEDED FOR ANXIETY (Patient taking differently: Take 0.5 mg by mouth 2 (two) times daily as needed for anxiety.) 60 tablet 3   famotidine  (PEPCID ) 20 MG tablet One after supper 30 tablet 11   fluorometholone (FML) 0.1 % ophthalmic suspension Place 1 drop into the right eye 3 (three) times daily.     melatonin 3 MG TABS tablet Take 3 mg by mouth at bedtime.     pantoprazole  (PROTONIX ) 40 MG tablet TAKE ONE TABLET (40MG  TOTAL) BY MOUTH DAILY 90 tablet 1   potassium chloride  SA (KLOR-CON  M) 20 MEQ tablet TAKE ONE (1) TABLET BY MOUTH EVERY DAY 90 tablet 0   Pseudoephedrine-Acetaminophen  (SM NON-ASPRIN SINUS PO) Take by mouth. (Patient not taking: Reported on 06/08/2024)  No current facility-administered medications for this visit.     Past Surgical History:  Procedure Laterality Date   ADJACENT TISSUE TRANSFER/TISSUE REARRANGEMENT Right 06/24/2023   Procedure: ADJACENT TISSUE TRANSFER/TISSUE REARRANGEMENT;  Surgeon: Luciano Standing, MD;  Location: [facility];  Service: ENT;  Laterality: Right;   COLONOSCOPY WITH PROPOFOL  N/A 08/09/2021   Procedure: COLONOSCOPY WITH PROPOFOL ;  Surgeon: Shaaron Lamar HERO, MD;  [facility];  Service: Endoscopy;  Laterality: N/A;  7:30am   EXCISION MASS HEAD Right 06/24/2023   Procedure: EXCISION AND REPAIR OF RIGHT CHEEK MOHS DEFECT;  Surgeon: Luciano Standing, MD;  Location: [facility];  Service: ENT;  Laterality: Right;    EXCISION MASS HEAD Bilateral 08/03/2023   Procedure: 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; with primary closure;  Surgeon: Luciano Standing, MD;  Location: [facility];  Service: ENT;  Laterality: Bilateral;  Lower Lip   EYE SURGERY Right 2025   [place] procedure  04/28/2022   for prolapsed cervix   MOHS SURGERY     SQUAMOUS CELL CARCINOMA EXCISION     VAGINAL DELIVERY       Allergies[2]    Family History  Problem Relation Age of Onset   Congestive Heart Failure Mother    Heart attack Mother    Diabetes Father    Heart Problems Father        pacemaker   Diverticulitis Sister    Hypertension Sister    Heart attack Brother    Healthy Daughter    Colon cancer Neg Hx      Social History Ms. Fitzpatrick reports that she has been smoking cigarettes. She has a 6 pack-year smoking history. She has never been exposed to tobacco smoke. She has never used smokeless tobacco. Ms. Wofford reports no history of alcohol use.     Physical Examination Today's Vitals   06/08/24 1051 06/08/24 1120  BP: (!) 160/74 (!) 140/72  Pulse: 71   SpO2: 96%   Weight: 130 lb 3.2 oz (59.1 kg)   Height: 5' 6 (1.676 m)    Body mass index is 21.01 kg/m.  Gen: resting comfortably, no acute distress HEENT: no scleral icterus, pupils equal round and reactive, no palptable cervical adenopathy,  CV: RRR, no m/rg, no jvd Resp: Clear to auscultation bilaterally GI: abdomen is soft, non-tender, non-distended, normal bowel sounds, no hepatosplenomegaly MSK: extremities are warm, no edema.  Skin: warm, no rash Neuro:  no focal deficits Psych: appropriate affect   Diagnostic Studies  12/2023 coronary calcium  score IMPRESSION: Coronary calcium  score of 1200. This was 99th percentile for age-, race-, and sex-matched controls.   Assessment and Plan  1.Coronary atherosclerosis - elevated coronary calcium  score - no specific cardiac symptoms, no indication for  ischemic testing at this time - continue aspirin, statin, risk factor modification.       2. HTN - bp elevated here, she will keep a bp log x 1 week - would start losartan 12.5mg  daily if needed, she is on max dose norvasc   F/u 6 months  Dorn PHEBE Ross, M.D.     [1]  Allergies Allergen Reactions   Doxycycline  Rash   Aquaphor [Lanolin-Petrolatum ] Itching and Rash    Redness  [2]  Allergies Allergen Reactions   Doxycycline  Rash   Aquaphor [Lanolin-Petrolatum ] Itching and Rash    Redness   "

## 2024-06-15 ENCOUNTER — Ambulatory Visit: Admitting: Internal Medicine

## 2024-06-15 ENCOUNTER — Encounter: Payer: Self-pay | Admitting: Internal Medicine

## 2024-06-15 VITALS — BP 154/83 | HR 70 | Ht 66.0 in | Wt 137.0 lb

## 2024-06-15 DIAGNOSIS — F17210 Nicotine dependence, cigarettes, uncomplicated: Secondary | ICD-10-CM

## 2024-06-15 DIAGNOSIS — J449 Chronic obstructive pulmonary disease, unspecified: Secondary | ICD-10-CM | POA: Diagnosis not present

## 2024-06-15 NOTE — Progress Notes (Signed)
 "   DEBHORA TITUS, female    DOB: [birth year]    MRN: [id number]   Brief patient profile:  41   yowf  active smoker some allergies growing up but no need for inhalers  referred to pulmonary clinic in [place]  11/25/2023 by Dr Bluford for copd eval with onset of doe x 2015 on prn alb but changed to breztri  June 2025 from trelegy with some improvement.  Pt not previously seen by PCCM service.    History of Present Illness  11/25/2023  Pulmonary/ 1st office eval/ Lanard Arguijo / [place] [facility]  Chief Complaint  Patient presents with   Establish Care  Dyspnea:  walking indoors due to heat /does walking  at [employer] slower pace than others = MMRC2 = can't walk a nl pace on a flat grade s sob but does fine slow and flat  Cough: no excess production even in am  Sleep: flat / 2 pillows  SABA use: avg 2-3 hfa / nad neb q am  02: none  Rec Plan A = Automatic = Always=    Breztri  1-2 puffs 1st thing in AM  and repeat 12 hours  Work on inhaler technique   Plan B = Backup (to supplement plan A, not to replace it) Only use your albuterol  inhaler as a rescue medication  Plan C = Crisis (instead of Plan B but only if Plan B stops working) - only use your albuterol  nebulizer if you first try Plan B Please schedule a follow up visit in 3 months but call sooner if needed - bring inhalers with you.    I personally reviewed images and/ agree with radiology impression as follows:   Chest CT cuts on cardiac study   01/04/24   Imaged lungs are clear. No pleural effusion or pneumothorax.    03/01/2024  f/u ov/[place] office/Jedd Schulenburg re: GOLD  1 COPD maint on breztri  2bid  still active smoker did  bring inhalers  Chief Complaint  Patient presents with   COPD    F/u breztri  has helped  Dyspnea:  some outdoor walking 10 min twice daily  Cough: some am mucus clears up quickly Sleeping: bed flat 2 pillows no  resp cc  SABA use: much less use on breztri  now  02: none  Lung cancer screening: CT 09/15/23  emphysema. No nodules  Patient Instructions  GERD diet reviewed, bed blocks rec  Pantoprazole  (protonix ) 40 mg   Take  30-60 min before first meal of the day and  ADD Pepcid  (famotidine )  20 mg after supper until return to office  Also  Ok to try albuterol  15 min before an activity (on alternating days)  that you know would usually make you short of breath      06/15/2024  f/u ov/[place] office/Thomas Rhude re: GOLD  1 COPD maint on breztri    Chief Complaint  Patient presents with   COPD    Pt states she is not having any resp symptoms (husband disagrees)  Breztri  seems to have helped pt a lot   Dyspnea:  shopping with her girlfriends / doing step exercise / doing laps x 10 min  3-4 / day  Cough: couple coughs clears it in am / white  Sleeping: bed is flat/ 2 pillows s resp cc  SABA use: rarely  02: none     No obvious day to day or daytime variability or assoc excess/ purulent sputum or mucus plugs or hemoptysis or cp or chest tightness, subjective wheeze or overt  sinus or hb symptoms.    Also denies any obvious fluctuation of symptoms with weather or environmental changes or other aggravating or alleviating factors except as outlined above   No unusual exposure hx or h/o childhood pna/ asthma or knowledge of premature birth.  Current Allergies, Complete Past Medical History, Past Surgical History, Family History, and Social History were reviewed in [employer] record.  ROS  The following are not active complaints unless bolded Hoarseness, sore throat, dysphagia, dental problems, itching, sneezing,  nasal congestion or discharge of excess mucus or purulent secretions, ear ache,   fever, chills, sweats, unintended wt loss or wt gain, classically pleuritic or exertional cp,  orthopnea pnd or arm/hand swelling  or leg swelling, presyncope, palpitations, abdominal pain, anorexia, nausea, vomiting, diarrhea  or change in bowel habits or change in bladder habits, change in  stools or change in urine, dysuria, hematuria,  rash, arthralgias, visual complaints, headache, numbness, weakness or ataxia or problems with walking or coordination,  change in mood or  memory.         Outpatient Medications Prior to Visit  Medication Sig Dispense Refill   Acetaminophen  (TYLENOL  ARTHRITIS PAIN PO) Take by mouth as needed.     albuterol  (VENTOLIN  HFA) 108 (90 Base) MCG/ACT inhaler INHALE TWO PUFFS INTO THE LUNGS EVERY SIX HOURS AS NEEDED FOR WHEEZING OR SHORTNESS OF BREATH 18 g 3   amLODipine  (NORVASC ) 10 MG tablet TAKE ONE TABLET (10MG  TOTAL) BY MOUTH DAILY 90 tablet 1   aspirin EC 81 MG tablet Take 81 mg by mouth daily. Swallow whole.     atorvastatin  (LIPITOR) 20 MG tablet Take 1 tablet (20 mg total) by mouth daily. 30 tablet 6   budesonide -glycopyrrolate-formoterol (BREZTRI  AEROSPHERE) 160-9-4.8 MCG/ACT AERO inhaler Inhale 2 puffs into the lungs 2 (two) times daily. 10.7 g 11   celecoxib  (CELEBREX ) 100 MG capsule TAKE ONE CAPSULE (100MG  TOTAL) BY MOUTH TWO TIMES DAILY AS NEEDED. 60 capsule 2   clonazePAM  (KLONOPIN ) 0.5 MG tablet TAKE ONE TABLET (0.5MG  TOTAL) BY MOUTH TWO TIMES DAILY AS NEEDED FOR ANXIETY (Patient taking differently: Take 0.5 mg by mouth 2 (two) times daily as needed for anxiety.) 60 tablet 3   famotidine  (PEPCID ) 20 MG tablet One after supper 30 tablet 11   fluorometholone (FML) 0.1 % ophthalmic suspension Place 1 drop into the right eye 3 (three) times daily.     melatonin 3 MG TABS tablet Take 3 mg by mouth at bedtime.     pantoprazole  (PROTONIX ) 40 MG tablet TAKE ONE TABLET (40MG  TOTAL) BY MOUTH DAILY 90 tablet 1   potassium chloride  SA (KLOR-CON  M) 20 MEQ tablet TAKE ONE (1) TABLET BY MOUTH EVERY DAY 90 tablet 0   albuterol  (PROVENTIL ) (2.5 MG/3ML) 0.083% nebulizer solution Take 3 mLs (2.5 mg total) by nebulization every 6 (six) hours as needed for wheezing or shortness of breath. (Patient not taking: Reported on 06/15/2024) 150 mL 1    Pseudoephedrine-Acetaminophen  (SM NON-ASPRIN SINUS PO) Take by mouth. (Patient not taking: Reported on 06/15/2024)     No facility-administered medications prior to visit.          Past Medical History:  Diagnosis Date   Asthma    COPD (chronic obstructive pulmonary disease) (HCC)    GERD (gastroesophageal reflux disease)    Hypertension    Prolapsed uterus    per patient, dx by OB-GYN   RA (rheumatoid arthritis) (HCC)    Smoker    1/4/ppd   squamous  cell of the rt face 10/2022      Objective:    Wts  06/15/2024       137   03/01/24 130 lb 3.2 oz (59.1 kg)  01/26/24 129 lb (58.5 kg)  12/14/23 133 lb 6.4 oz (60.5 kg)    Vital signs reviewed  06/15/2024  - Note at rest 02 sats  99% on RA   General appearance:    amb thin wf nad / all smiles     HEENT : Oropharynx  clear   Nasal turbinates nl    NECK :  without  apparent JVD/ palpable Nodes/TM    LUNGS: no acc muscle use,  Min barrel  contour chest wall with bilateral  slightly decreased bs s audible wheeze and  without cough on insp or exp maneuvers and min  Hyperresonant  to  percussion bilaterally    CV:  RRR  no s3 or murmur or increase in P2, and no edema   ABD:  soft and nontender    MS:  Nl gait/ ext warm without deformities Or obvious joint restrictions  calf tenderness, cyanosis or clubbing     SKIN: warm and dry without lesions    NEURO:  alert, approp, nl sensorium with  no motor or cerebellar deficits apparent.              Assessment   Assessment & Plan Chronic obstructive pulmonary disease, unspecified COPD type (HCC)  COPD  GOLD 1 COPD GOLD 1/ Active smoker  - 11/25/2023  After extensive coaching inhaler device,  effectiveness =    75% from a baseline of 50%  - 11/25/2023   Walked on RA  x  3  lap(s) =  approx 450  ft  @ mod pace, stopped due end of study with lowest 02 sats 97% s sob   - PFT's  12/25/23   FEV1 2.34 (94 % ) ratio 0.64  p 0 % improvement from saba p Breztri  x one puff  prior to  study with DLCO  14.20 (70%)   and FV curve min concave       Group E in terms of symptoms/risk so  laba/lama/ICS  therefore appropriate rx at this point >>>  breztri    and  continue approp SABA prn.         Each maintenance medication was reviewed in detail including emphasizing most importantly the difference between maintenance and prns and under what circumstances the prns are to be triggered using an action plan format where appropriate.  Total time for H and P, chart review, counseling, reviewing hfa  device(s) and generating customized AVS unique to this office visit / same day charting = 20 min            AVS  Patient Instructions  No changes in medications    Please schedule a follow up visit in  6  months but call sooner if needed    Ozell America, MD 06/15/2024             "

## 2024-06-15 NOTE — Assessment & Plan Note (Addendum)
 COPD GOLD 1/ Active smoker  - 11/25/2023  After extensive coaching inhaler device,  effectiveness =    75% from a baseline of 50%  - 11/25/2023   Walked on RA  x  3  lap(s) =  approx 450  ft  @ mod pace, stopped due end of study with lowest 02 sats 97% s sob   - PFT's  12/25/23   FEV1 2.34 (94 % ) ratio 0.64  p 0 % improvement from saba p Breztri  x one puff  prior to study with DLCO  14.20 (70%)   and FV curve min concave       Group E in terms of symptoms/risk so  laba/lama/ICS  therefore appropriate rx at this point >>>  breztri    and  continue approp SABA prn.         Each maintenance medication was reviewed in detail including emphasizing most importantly the difference between maintenance and prns and under what circumstances the prns are to be triggered using an action plan format where appropriate.  Total time for H and P, chart review, counseling, reviewing hfa  device(s) and generating customized AVS unique to this office visit / same day charting = 20 min

## 2024-06-15 NOTE — Patient Instructions (Signed)
 No changes in medications    Please schedule a follow up visit in  6  months but call sooner if needed

## 2024-08-22 ENCOUNTER — Ambulatory Visit: Admitting: Podiatry

## 2024-09-06 ENCOUNTER — Ambulatory Visit: Admitting: Rheumatology

## 2025-01-02 ENCOUNTER — Encounter: Admitting: Adult Health
